# Patient Record
Sex: Male | Born: 1950 | Race: White | Hispanic: No | Marital: Married | Smoking: Current every day smoker
Health system: Southern US, Community
[De-identification: ages and names within clinical notes are randomized; demographics above are authoritative.]

## PROBLEM LIST (undated history)

## (undated) DIAGNOSIS — M1990 Unspecified osteoarthritis, unspecified site: Secondary | ICD-10-CM

## (undated) DIAGNOSIS — Z860100 Personal history of colon polyps, unspecified: Secondary | ICD-10-CM

## (undated) DIAGNOSIS — IMO0002 Reserved for concepts with insufficient information to code with codable children: Secondary | ICD-10-CM

## (undated) DIAGNOSIS — I10 Essential (primary) hypertension: Secondary | ICD-10-CM

## (undated) DIAGNOSIS — E119 Type 2 diabetes mellitus without complications: Secondary | ICD-10-CM

## (undated) DIAGNOSIS — E785 Hyperlipidemia, unspecified: Secondary | ICD-10-CM

## (undated) DIAGNOSIS — I2510 Atherosclerotic heart disease of native coronary artery without angina pectoris: Secondary | ICD-10-CM

## (undated) DIAGNOSIS — C189 Malignant neoplasm of colon, unspecified: Secondary | ICD-10-CM

## (undated) DIAGNOSIS — K611 Rectal abscess: Secondary | ICD-10-CM

## (undated) DIAGNOSIS — Z87442 Personal history of urinary calculi: Secondary | ICD-10-CM

## (undated) DIAGNOSIS — K259 Gastric ulcer, unspecified as acute or chronic, without hemorrhage or perforation: Secondary | ICD-10-CM

## (undated) DIAGNOSIS — K219 Gastro-esophageal reflux disease without esophagitis: Secondary | ICD-10-CM

## (undated) DIAGNOSIS — N200 Calculus of kidney: Secondary | ICD-10-CM

## (undated) DIAGNOSIS — R0600 Dyspnea, unspecified: Secondary | ICD-10-CM

## (undated) DIAGNOSIS — I219 Acute myocardial infarction, unspecified: Secondary | ICD-10-CM

## (undated) DIAGNOSIS — J449 Chronic obstructive pulmonary disease, unspecified: Secondary | ICD-10-CM

## (undated) HISTORY — DX: Hyperlipidemia, unspecified: E78.5

## (undated) HISTORY — DX: Essential (primary) hypertension: I10

## (undated) HISTORY — DX: Unspecified osteoarthritis, unspecified site: M19.90

## (undated) HISTORY — DX: Calculus of kidney: N20.0

## (undated) HISTORY — PX: POLYPECTOMY: SHX149

## (undated) HISTORY — DX: Reserved for concepts with insufficient information to code with codable children: IMO0002

## (undated) HISTORY — DX: Type 2 diabetes mellitus without complications: E11.9

## (undated) HISTORY — PX: COLONOSCOPY: SHX174

## (undated) HISTORY — DX: Chronic obstructive pulmonary disease, unspecified: J44.9

## (undated) HISTORY — DX: Atherosclerotic heart disease of native coronary artery without angina pectoris: I25.10

## (undated) HISTORY — DX: Acute myocardial infarction, unspecified: I21.9

---

## 2008-04-04 DIAGNOSIS — I219 Acute myocardial infarction, unspecified: Secondary | ICD-10-CM

## 2008-04-04 HISTORY — DX: Acute myocardial infarction, unspecified: I21.9

## 2008-04-04 HISTORY — PX: CORONARY ANGIOPLASTY WITH STENT PLACEMENT: SHX49

## 2008-04-29 ENCOUNTER — Ambulatory Visit: Payer: Self-pay | Admitting: Cardiology

## 2008-04-30 ENCOUNTER — Inpatient Hospital Stay (HOSPITAL_COMMUNITY): Admission: EM | Admit: 2008-04-30 | Discharge: 2008-05-02 | Payer: Self-pay | Admitting: Emergency Medicine

## 2008-05-06 ENCOUNTER — Ambulatory Visit: Payer: Self-pay | Admitting: Cardiology

## 2008-05-26 ENCOUNTER — Ambulatory Visit: Payer: Self-pay

## 2008-06-20 DIAGNOSIS — I2510 Atherosclerotic heart disease of native coronary artery without angina pectoris: Secondary | ICD-10-CM | POA: Insufficient documentation

## 2008-06-20 DIAGNOSIS — I10 Essential (primary) hypertension: Secondary | ICD-10-CM | POA: Insufficient documentation

## 2008-06-24 ENCOUNTER — Encounter: Payer: Self-pay | Admitting: Cardiology

## 2008-06-24 ENCOUNTER — Ambulatory Visit: Payer: Self-pay

## 2008-06-24 DIAGNOSIS — E785 Hyperlipidemia, unspecified: Secondary | ICD-10-CM | POA: Insufficient documentation

## 2008-06-24 DIAGNOSIS — L270 Generalized skin eruption due to drugs and medicaments taken internally: Secondary | ICD-10-CM | POA: Insufficient documentation

## 2008-06-24 DIAGNOSIS — E663 Overweight: Secondary | ICD-10-CM | POA: Insufficient documentation

## 2008-07-30 ENCOUNTER — Encounter: Payer: Self-pay | Admitting: Gastroenterology

## 2008-08-01 ENCOUNTER — Encounter (INDEPENDENT_AMBULATORY_CARE_PROVIDER_SITE_OTHER): Payer: Self-pay | Admitting: *Deleted

## 2008-08-01 ENCOUNTER — Encounter: Admission: RE | Admit: 2008-08-01 | Discharge: 2008-08-01 | Payer: Self-pay | Admitting: Family Medicine

## 2008-08-15 ENCOUNTER — Telehealth: Payer: Self-pay | Admitting: Cardiology

## 2008-08-22 ENCOUNTER — Ambulatory Visit: Payer: Self-pay | Admitting: Cardiology

## 2008-08-22 DIAGNOSIS — E7800 Pure hypercholesterolemia, unspecified: Secondary | ICD-10-CM | POA: Insufficient documentation

## 2008-08-25 ENCOUNTER — Ambulatory Visit: Payer: Self-pay | Admitting: Gastroenterology

## 2008-08-25 DIAGNOSIS — K7689 Other specified diseases of liver: Secondary | ICD-10-CM

## 2008-08-25 DIAGNOSIS — K7581 Nonalcoholic steatohepatitis (NASH): Secondary | ICD-10-CM | POA: Insufficient documentation

## 2008-08-29 ENCOUNTER — Encounter: Payer: Self-pay | Admitting: Gastroenterology

## 2008-09-02 ENCOUNTER — Telehealth: Payer: Self-pay | Admitting: Gastroenterology

## 2008-09-04 LAB — CONVERTED CEMR LAB
ALT: 27 units/L (ref 0–53)
AST: 24 units/L (ref 0–37)
Albumin: 3.9 g/dL (ref 3.5–5.2)
Alkaline Phosphatase: 60 units/L (ref 39–117)
Bilirubin, Direct: 0 mg/dL (ref 0.0–0.3)
Cholesterol: 125 mg/dL (ref 0–200)
HDL: 30 mg/dL — ABNORMAL LOW (ref >39.00)
LDL Cholesterol: 76 mg/dL (ref 0–99)
Total Bilirubin: 0.8 mg/dL (ref 0.3–1.2)
Total CHOL/HDL Ratio: 4
Total Protein: 7.6 g/dL (ref 6.0–8.3)
Triglycerides: 95 mg/dL (ref 0.0–149.0)
VLDL: 19 mg/dL (ref 0.0–40.0)

## 2008-09-11 ENCOUNTER — Ambulatory Visit: Payer: Self-pay | Admitting: Gastroenterology

## 2008-09-11 ENCOUNTER — Encounter: Payer: Self-pay | Admitting: Gastroenterology

## 2008-09-12 ENCOUNTER — Encounter: Payer: Self-pay | Admitting: Gastroenterology

## 2008-09-29 ENCOUNTER — Telehealth: Payer: Self-pay | Admitting: Cardiology

## 2008-10-28 ENCOUNTER — Telehealth: Payer: Self-pay | Admitting: Cardiology

## 2008-11-24 ENCOUNTER — Telehealth: Payer: Self-pay | Admitting: Cardiology

## 2008-12-19 ENCOUNTER — Ambulatory Visit: Payer: Self-pay | Admitting: Cardiology

## 2008-12-20 ENCOUNTER — Encounter: Payer: Self-pay | Admitting: Cardiology

## 2008-12-24 ENCOUNTER — Ambulatory Visit: Payer: Self-pay | Admitting: Cardiology

## 2008-12-24 ENCOUNTER — Telehealth: Payer: Self-pay | Admitting: Cardiology

## 2009-01-21 ENCOUNTER — Telehealth: Payer: Self-pay | Admitting: Cardiology

## 2009-02-17 ENCOUNTER — Telehealth: Payer: Self-pay | Admitting: Cardiology

## 2009-02-18 ENCOUNTER — Encounter (INDEPENDENT_AMBULATORY_CARE_PROVIDER_SITE_OTHER): Payer: Self-pay | Admitting: *Deleted

## 2009-02-20 ENCOUNTER — Telehealth: Payer: Self-pay | Admitting: Cardiology

## 2009-03-06 ENCOUNTER — Encounter (INDEPENDENT_AMBULATORY_CARE_PROVIDER_SITE_OTHER): Payer: Self-pay | Admitting: *Deleted

## 2009-03-11 ENCOUNTER — Telehealth: Payer: Self-pay | Admitting: Cardiology

## 2009-03-23 ENCOUNTER — Telehealth: Payer: Self-pay | Admitting: Cardiology

## 2009-04-09 ENCOUNTER — Telehealth: Payer: Self-pay | Admitting: Cardiology

## 2009-04-10 ENCOUNTER — Encounter (INDEPENDENT_AMBULATORY_CARE_PROVIDER_SITE_OTHER): Payer: Self-pay | Admitting: *Deleted

## 2009-04-22 ENCOUNTER — Telehealth: Payer: Self-pay | Admitting: Cardiology

## 2009-04-29 ENCOUNTER — Ambulatory Visit: Payer: Self-pay | Admitting: Cardiology

## 2009-04-29 ENCOUNTER — Encounter: Payer: Self-pay | Admitting: Cardiology

## 2009-05-28 ENCOUNTER — Telehealth: Payer: Self-pay | Admitting: Cardiology

## 2009-06-02 ENCOUNTER — Ambulatory Visit: Payer: Self-pay | Admitting: Cardiology

## 2009-06-04 ENCOUNTER — Encounter: Payer: Self-pay | Admitting: Cardiology

## 2009-06-18 ENCOUNTER — Telehealth (INDEPENDENT_AMBULATORY_CARE_PROVIDER_SITE_OTHER): Payer: Self-pay | Admitting: *Deleted

## 2009-06-30 ENCOUNTER — Encounter (INDEPENDENT_AMBULATORY_CARE_PROVIDER_SITE_OTHER): Payer: Self-pay | Admitting: *Deleted

## 2009-07-17 ENCOUNTER — Telehealth: Payer: Self-pay | Admitting: Cardiology

## 2009-07-17 ENCOUNTER — Telehealth (INDEPENDENT_AMBULATORY_CARE_PROVIDER_SITE_OTHER): Payer: Self-pay | Admitting: *Deleted

## 2009-09-03 ENCOUNTER — Emergency Department (HOSPITAL_COMMUNITY): Admission: EM | Admit: 2009-09-03 | Discharge: 2009-09-04 | Payer: Self-pay | Admitting: Emergency Medicine

## 2009-09-15 ENCOUNTER — Encounter (INDEPENDENT_AMBULATORY_CARE_PROVIDER_SITE_OTHER): Payer: Self-pay | Admitting: *Deleted

## 2009-09-17 ENCOUNTER — Telehealth: Payer: Self-pay | Admitting: Cardiology

## 2009-10-15 ENCOUNTER — Telehealth: Payer: Self-pay | Admitting: Cardiology

## 2010-01-13 ENCOUNTER — Telehealth: Payer: Self-pay | Admitting: Cardiology

## 2010-05-04 NOTE — Assessment & Plan Note (Signed)
Summary: ABD PN/FH   History of Present Illness Visit Type: consult Primary GI MD: Erskine Emery MD [facility] Primary Provider: Lynne Logan, MD Requesting Provider: Lynne Logan, ,MD Chief Complaint: RUQ pain, also right-sided chest pain Mr. Hamblin is a 60 year old white male referred at the request of Dr. Nolon Rod for colorectal cancer screening.  He only complaint is occasional right upper quadrant pain which is made worse by palpation.  Abdominal ultrasound, which I reviewed, demonstrated  some coarseness of the hepatic parenchyma suggestive of hepatic steatosis.  He denies change in bowel habits, melena or hematochezia.  Medical history is pertinent for coronary artery disease.  He status post MI in January, 2010 with placement of a coronary artery stent.   GI Review of Systems    Reports acid reflux, bloating, and  heartburn.      Denies abdominal pain, belching, chest pain, dysphagia with liquids, dysphagia with solids, loss of appetite, nausea, vomiting, vomiting blood, weight loss, and  weight gain.        Denies anal fissure, black tarry stools, change in bowel habit, constipation, diarrhea, diverticulosis, fecal incontinence, heme positive stool, hemorrhoids, irritable bowel syndrome, jaundice, light color stool, liver problems, rectal bleeding, and  rectal pain. Preventive Screening-Counseling & Management      Drug Use:  no.      Current Medications (verified): 1)  Nitroglycerin 0.4 Mg Subl (Nitroglycerin) .... One Tablet Under Tongue Every 5 Minutes As Needed For Chest Pain---May Repeat Times Three 2)  Effient 10 Mg Tabs (Prasugrel Hcl) .Marland Kitchen.. 1 Once Daily 3)  Simvastatin 40 Mg Tabs (Simvastatin) .... Take One Tablet By Mouth Daily At Bedtime 4)  Metoprolol Tartrate 25 Mg Tabs (Metoprolol Tartrate) .... Take One Tablet By Mouth Twice A Day 5)  Tracer Study Drug .... Uad 6)  Bayer Aspirin 325 Mg Tabs (Aspirin) .... Take 1 Tablet By Mouth Once A Day  Allergies  (verified): 1)  ! Plavix (Clopidogrel Bisulfate)  Past History:  Past Medical History:     1. Coronary artery disease (cardiac catheterization on April 29, 2008,  right coronary artery distal occlusion with stenting of this with Xience drug-eluting stent.  The patient also had a luminal irregularities in the LAD and 40-50% mid circumflex stenosis      2. Hypertension.      3. Nephrolithiasis.     Myocardial Infarction  Past Surgical History:    PTCA-Stent 04/30/08  Family History:    Noncontributory for early coronary disease but is      contributory for strong family history of cancer    Family History of Colon Cancer: Mother    Family History of Diabetes: Mother    Family History of Lung Cancer: Father  Social History:    The patient is married.  He has 3 children.  He works      as a Games developer.  He has been smoking at least 1 pack per day for 40      years.     Alcohol Use - no    Daily Caffeine Use 2/daily (Mt. Dew, Tea)    Illicit Drug Use - no    Drug Use:  no  Review of Systems       The patient complains of arthritis/joint pain, back pain, and muscle pains/cramps.  The patient denies allergy/sinus, anemia, anxiety-new, blood in urine, breast changes/lumps, change in vision, confusion, cough, coughing up blood, depression-new, fainting, fatigue, fever, headaches-new, hearing problems, heart murmur, heart rhythm changes, itching, night  sweats, nosebleeds, shortness of breath, skin rash, sleeping problems, sore throat, swelling of feet/legs, swollen lymph glands, thirst - excessive, urination - excessive, urination changes/pain, urine leakage, vision changes, and voice change.         Other systems were reviewed and were negative  Vital Signs:  Patient profile:   60 year old male Height:      64 inches Weight:      204 pounds BMI:     35.14 Pulse rate:   68 / minute Pulse rhythm:   regular BP sitting:   120 / 60  (left arm) Cuff size:   regular  Vitals Entered By:  Abelino Derrick CMA (Aug 25, 2008 4:14 PM)  Physical Exam  Additional Exam:  Usual developed well-nourished male  skin: anicteric HEENT: normocephalic; PEERLA; no nasal or pharyngeal abnormalities neck: supple nodes: no cervical lymphadenopathy chest: clear to ausculatation and percussion heart: no murmurs, gallops, or rubs abd: soft, nontender; BS normoactive; no abdominal masses, tenderness, organomegaly rectal: deferred ext: no cynanosis, clubbing, edema skeletal: no deformities neuro: oriented x 3; no focal abnormalities    Impression & Recommendations:  Problem # 1:  FATTY LIVER DISEASE (ICD-571.8) Patient has hepatic steatosis.  Liver tests from July 22, 2008 were normal.  Recommendations #1 no specific GI therapy.  Weight loss would  be ideal.  Problem # 2:  SPECIAL SCREENING FOR MALIGNANT NEOPLASMS COLON (ICD-V76.51)  Plan screening colonoscopy  Orders: Colonoscopy (Colon)  Problem # 3:  CAD, UNSPECIFIED SITE (ICD-414.00) Assessment: Comment Only  Problem # 4:  HYPERTENSION, UNSPECIFIED (ICD-401.9) Assessment: Comment Only  Patient Instructions: 1)  Colonoscopy and Flexible Sigmoidoscopy brochure given.  2)  Conscious Sedation brochure given.  3)  Your Colonoscopy is scheduled for 09/04/2008 at 3pm 4)  You can pick up your Movi-Prep from your pharmacy today 5)  CC Dr. Lynne Logan 6)  The medication list was reviewed and reconciled.  All changed / newly prescribed medications were explained.  A complete medication list was provided to the patient / caregiver. Prescriptions: MOVIPREP 100 GM  SOLR (PEG-KCL-NACL-NASULF-NA ASC-C) As per prep instructions.  #1 x 0   Entered by:   Genella Mech CMA   Authorized by:   Inda Castle MD   Signed by:   Genella Mech CMA on 08/25/2008   Method used:   [facility]  [address] [phone]       [place], [place]  [address]       Ph: [phone] or  [phone]       Fax: [phone]   RxID:   [id number]

## 2010-05-04 NOTE — Progress Notes (Signed)
Summary: Samples Effient  Phone Note Outgoing Call   Call placed by: Levora Angel, CNA,  April 09, 2009 4:13 PM Summary of Call: Callled pt, notified him that samples of Effient will be left at from desk.  Levora Angel, CNA  April 09, 2009 4:14 PM

## 2010-05-04 NOTE — Progress Notes (Signed)
Summary: SAMPLES OF MEDS  Phone Note Refill Request Call back at Home Phone [phone] Message from:  Patient on September 29, 2008 3:06 PM  Refills Requested: Medication #1:  EFFIENT 10 MG TABS 1 once daily SAMPLE OF MEDS.    Method Requested: Pick up at Office Initial call taken by: Neil Crouch,  September 29, 2008 3:06 PM Caller: Patient Reason for Call: Talk to Nurse  Follow-up for Phone Call        SAMPLES OF EFFIENT 10MG LOT [id number] C EXP 11/10  # 28.  PT AWARE  Follow-up by: Sim Boast, RN,  September 29, 2008 3:50 PM

## 2010-05-04 NOTE — Progress Notes (Signed)
Summary: request samples done daj  Phone Note Call from Patient Call back at Home Phone [phone]   Caller: Patient Summary of Call: request samples effient Initial call taken by: Darnell Level,  February 17, 2009 11:12 AM  Follow-up for Phone Call        left #14 at front desk will  call pt - no answer left message for pt to call back.  pt returned call will leave bottles in front Follow-up by: Lubertha Basque, CNA,  February 18, 2009 1:13 PM

## 2010-05-04 NOTE — Progress Notes (Signed)
Summary: samples  Phone Note Call from Patient Call back at Home Phone [phone]   Caller: Patient Summary of Call: wanting to know if we have any effient samples Initial call taken by: Darnell Level,  April 22, 2009 2:35 PM  Follow-up for Phone Call        Effient samples #14 [id number] C, 06/2010 exp. put out front for pt.  Pt was also given co-pay card to use.  Called patient and left message on machine for pt. Follow-up by: Sim Boast, RN,  April 22, 2009 3:11 PM

## 2010-05-04 NOTE — Progress Notes (Signed)
Summary: SAMPLE OF MEDS  Phone Note Refill Request Call back at Home Phone [phone] Message from:  Patient on November 24, 2008 11:14 AM  Refills Requested: Medication #1:  EFFIENT 10 MG TABS 1 once daily SAMPLES  Initial call taken by: Neil Crouch,  November 24, 2008 11:15 AM  Follow-up for Phone Call        samples left at front desk, No answer at home. Levora Angel, CNA  November 25, 2008 11:28 AM  Pt notified rx samples left at front desk. Levora Angel, CNA  November 25, 2008 3:44 PM

## 2010-05-04 NOTE — Progress Notes (Signed)
Summary: sample of EFFIENT 10 MG TABS 1 once daily  Phone Note Call from Patient Call back at Home Phone [phone]   Reason for Call: Talk to Nurse Summary of Call: sample of EFFIENT 10 MG TABS 1 once daily. the effient copay card expired . can he get another.  Initial call taken by: Neil Crouch,  October 15, 2009 3:49 PM  Follow-up for Phone Call        [facility] for pt. samples, a effient card and new RX @ the front desk for pt. Levora Angel, CNA  October 16, 2009 4:09 PM  Follow-up by: Levora Angel, CNA,  October 16, 2009 4:08 PM    Prescriptions: EFFIENT 10 MG TABS (PRASUGREL HCL) 1 once daily  #30 Tablet x 11   Entered by:   Levora Angel, CNA   Authorized by:   Wadie Lessen, MD, [facility]   Signed by:   Levora Angel, CNA on 10/16/2009   Method used:   Print then Give to Patient   RxID:   [id number] EFFIENT 10 MG TABS (PRASUGREL HCL) 1 once daily  #30 Tablet x 11   Entered by:   Levora Angel, CNA   Authorized by:   Minus Breeding, MD, [facility]   Signed by:   Levora Angel, CNA on 10/16/2009   Method used:   Print then Give to Patient   RxID:   [id number]

## 2010-05-04 NOTE — Assessment & Plan Note (Signed)
Summary: 6 month rov/sl   Visit Type:  Follow-up Primary Provider:  Lynne Logan, MD  CC:  CAD.  History of Present Illness: The patient presents for followup of his known coronary disease. Since I last saw him he has done well. He has had no further chest discomfort. 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 With this he denies any chest pressure, neck or arm discomfort. He has had no palpitations, presyncope or syncope. He has had no resting complaints such as PND or orthopnea. He did have a tendinitis and tendon release recently in his right shoulder as he had significant discomfort with range of motion activities.  Current Medications (verified): 1)  Nitroglycerin 0.4 Mg Subl (Nitroglycerin) .... One Tablet Under Tongue Every 5 Minutes As Needed For Chest Pain---May Repeat Times Three 2)  Effient 10 Mg Tabs (Prasugrel Hcl) .Marland Kitchen.. 1 Once Daily 3)  Simvastatin 40 Mg Tabs (Simvastatin) .... Take One Tablet By Mouth Daily At Bedtime 4)  Metoprolol Tartrate 25 Mg Tabs (Metoprolol Tartrate) .... Take One Tablet By Mouth Twice A Day 5)  Tracer Study Drug .... Uad 6)  Bayer Aspirin 325 Mg Tabs (Aspirin) .... Take 1 Tablet By Mouth Once A Day  Allergies (verified): 1)  ! Plavix (Clopidogrel Bisulfate)  Past History:  Past Medical History:  1. Coronary artery disease (cardiac catheterization on April 29, 2008,  right coronary artery distal occlusion with stenting of this with Xience drug-eluting stent.  The patient also had a luminal irregularities in the LAD and 40-50% mid circumflex stenosis )  2. Hypertension.   3. Nephrolithiasis.   4. Myocardial Infarction  Review of Systems       As stated in the HPI and negative for all other systems.   Vital Signs:  Patient profile:   60 year old male Height:      64 inches Weight:      201 pounds BMI:     34.63 Pulse rate:   72 / minute Resp:     16 per minute BP sitting:    150 / 91  (right arm)  Vitals Entered By: Levora Angel, CNA (December 24, 2008 4:33 PM)  Physical Exam  General:  Well developed, well nourished, in no acute distress. Head:  normocephalic and atraumatic Eyes:  PERRLA/EOM intact; conjunctiva and lids normal. Mouth:  Teeth, gums and palate normal. Oral mucosa normal. Neck:  Neck supple, no JVD. No masses, thyromegaly or abnormal cervical nodes. Chest Wall:  no deformities or breast masses noted Lungs:  Clear bilaterally to auscultation and percussion. Abdomen:  Bowel sounds positive; abdomen soft and non-tender without masses, organomegaly, or hernias noted. No hepatosplenomegaly. Msk:  Back normal, normal gait. Muscle strength and tone normal. Extremities:  No clubbing or cyanosis. Neurologic:  Alert and oriented x 3. Skin:  Intact without lesions or rashes. Cervical Nodes:  no significant adenopathy Axillary Nodes:  no significant adenopathy Inguinal Nodes:  no significant adenopathy Psych:  Normal affect.   Detailed Cardiovascular Exam  Neck    Carotids: Carotids full and equal bilaterally without bruits.      Neck Veins: Normal, no JVD.    Heart    Inspection: no deformities or lifts noted.      Palpation: normal PMI with no thrills palpable.      Auscultation: regular rate and rhythm, S1, S2 without murmurs, rubs, gallops, or clicks.    Vascular    Abdominal Aorta: no palpable masses,  pulsations, or audible bruits.      Femoral Pulses: normal femoral pulses bilaterally.      Pedal Pulses: normal pedal pulses bilaterally.      Radial Pulses: normal radial pulses bilaterally.      Peripheral Circulation: no clubbing, cyanosis, or edema noted with normal capillary refill.     EKG  Procedure date:  12/24/2008  Findings:      Sinus rhythm, rate 72, axis within normal limits, intervals within normal limits, no acute ST-T wave changes.  Impression & Recommendations:  Problem # 1:  CAD, UNSPECIFIED SITE  (ICD-414.00) The patient has no new cardiovascular symptoms. At this point no further cardiovascular testing is suggested. He needs to continue with aggressive risk reduction.   Orders: EKG w/ Interpretation (93000)  Problem # 2:  HYPERTENSION, UNSPECIFIED (ICD-401.9) His Blood pressure is not well controlled. I will increase his metoprolol to 50 mg b.i.d. He also needs therapeutic lifestyle changes to include weight loss.  Problem # 3:  PURE HYPERCHOLESTEROLEMIA (ICD-272.0) I did review his lipid profile. His HDL is low. I have given him specifics on a walking regimen to try to improve this.  Problem # 4:  OVERWEIGHT (ICD-278.02) We had a long discussion about the need to lose weight with diet and exercise.  Patient Instructions: 1)  Your physician recommends that you schedule a follow-up appointment in: 6 MONTHS 2)  Your physician has recommended you make the following change in your medication: INCREASE METOPROLOL TO 50 MG TWICE DAILY NEED TO  NOTIFY DRUGSTORE

## 2010-05-04 NOTE — Assessment & Plan Note (Signed)
Summary: [id number]   Visit Type:  Follow-up Primary Provider:  SLynne Logan MD  CC:  CAD.  History of Present Illness: The patient presents for followup of his known coronary disease. At the last appointment I increased his beta blocker as his blood pressure was not at target. He did well with that and says that his blood pressures have been in the 1038Uto 1828Msystolic and diastolics well below 90. He has been walking every day since January 1. He has no chest pressure, neck or arm discomfort. These had no shortness of breath, PND or orthopnea. He said no palpitations, presyncope or syncope. He has not been able to lose weight because "I love to eat."  Current Medications (verified): 1)  Nitroglycerin 0.4 Mg Subl (Nitroglycerin) .... One Tablet Under Tongue Every 5 Minutes As Needed For Chest Pain---May Repeat Times Three 2)  Effient 10 Mg Tabs (Prasugrel Hcl) ..Marland Kitchen. 1 Once Daily 3)  Simvastatin 40 Mg Tabs (Simvastatin) .... Take One Tablet By Mouth Daily At Bedtime 4)  Metoprolol Tartrate 50 Mg Tabs (Metoprolol Tartrate) ..Marland Kitchen. 1 By Mouth Two Times A Day 5)  Bayer Aspirin 325 Mg Tabs (Aspirin) .... Take 1 Tablet By Mouth Once A Day  Allergies (verified): 1)  ! Plavix (Clopidogrel Bisulfate)  Past History:  Past Medical History: Reviewed history from 12/24/2008 and no changes required.  1. Coronary artery disease (cardiac catheterization on April 29, 2008,  right coronary artery distal occlusion with stenting of this with Xience drug-eluting stent.  The patient also had a luminal irregularities in the LAD and 40-50% mid circumflex stenosis )  2. Hypertension.   3. Nephrolithiasis.   4. Myocardial Infarction  Past Surgical History: Reviewed history from 08/25/2008 and no changes required. PTCA-Stent 04/30/08  Review of Systems       As stated in the HPI and negative for all other systems.   Vital Signs:  Patient profile:   60year old male Height:      64 inches Weight:       207 pounds BMI:     35.66 Pulse rate:   69 / minute Resp:     16 per minute BP sitting:   152 / 76  (right arm)  Vitals Entered By: CLevora Angel CNA (June 02, 2009 9:28 AM)  Physical Exam  General:  Well developed, well nourished, in no acute distress. Head:  normocephalic and atraumatic Eyes:  PERRLA/EOM intact; conjunctiva and lids normal. Mouth:  Poor dentition, gums and palate normal. Oral mucosa normal. Neck:  Neck supple, no JVD. No masses, thyromegaly or abnormal cervical nodes. Chest Wall:  no deformities or breast masses noted Lungs:  Clear bilaterally to auscultation and percussion. Heart:  Non-displaced PMI, chest non-tender; regular rate and rhythm, S1, S2 without murmurs, rubs or gallops. Carotid upstroke normal, no bruit. Normal abdominal aortic size, no bruits. Femorals normal pulses, no bruits. Pedals normal pulses. No edema, no varicosities. Abdomen:  Bowel sounds positive; abdomen soft and non-tender without masses, organomegaly, or hernias noted. No hepatosplenomegaly, obese Msk:  Back normal, normal gait. Muscle strength and tone normal. Extremities:  No clubbing or cyanosis. Neurologic:  Alert and oriented x 3. Skin:  Intact without lesions or rashes. Psych:  Normal affect.   EKG  Procedure date:  06/02/2009  Findings:      sinus rhythm, rate 69, axis within normal limits, intervals within normal limits,no acute ST-T wave changes.  Impression & Recommendations:  Problem # 1:  CAD, UNSPECIFIED  SITE (ICD-414.00) He is having no new symptoms.I would prefer that he stays on the Effient for now but may change this in the future. He could certainly discontinue it for any elective procedure. He will otherwise remain on the meds as listed and continue risk reduction. Orders: EKG w/ Interpretation (93000)  Problem # 2:  DYSLIPIDEMIA (ICD-272.4) I have given him written instructions for lipid profile and liver enzymes. I would like to review these with a goal  LDL less than 100 and HDL greater than 40.  Problem # 3:  OVERWEIGHT (ICD-278.02) We again discussed the need to lose weight with diet and exercise.  Problem # 4:  HYPERTENSION, UNSPECIFIED (ICD-401.9) His blood pressure is elevated today but as reported above it is well controlled otherwise. He will continue on the meds as listed.  Patient Instructions: 1)  Your physician recommends that you schedule a follow-up appointment in: 1 year with Dr Percival Spanish 2)  Your physician recommends that you continue on your current medications as directed. Please refer to the Current Medication list given to you today.

## 2010-05-04 NOTE — Miscellaneous (Signed)
Summary: Physician Information for Elective Procedures & Surgeries  Physician Information for Elective Procedures & Surgeries   Imported By: Bubba Hales 08/29/2008 08:41:48  _____________________________________________________________________  External Attachment:    Type:   Image     Comment:   External Document

## 2010-05-04 NOTE — Progress Notes (Signed)
Summary: [place] note---rx metoprolol  Phone Note Outgoing Call   Call placed by: Devra Dopp, LPN,  December 25, 386 5:43 PM Summary of Call: CALLED PT  TO FIND OUT DRUG STORE  TO CALL IN INCREASE DOSE OF METOPROLOL TO 50 MG two times a day  LM FOR PT TO CALL BACK TOMMORRROW Initial call taken by: Devra Dopp, LPN,  December 25, 8278 5:45 PM  Follow-up for Phone Call        Spoke with pt [facility] Levora Angel, CNA  December 25, 2008 9:14 AM  Follow-up by: Levora Angel, CNA,  December 25, 2008 9:14 AM    New/Updated Medications: METOPROLOL TARTRATE 50 MG TABS (METOPROLOL TARTRATE) 1 by mouth two times a day Prescriptions: METOPROLOL TARTRATE 50 MG TABS (METOPROLOL TARTRATE) 1 by mouth two times a day  #60 x 11   Entered by:   Levora Angel, CNA   Authorized by:   Minus Breeding, MD, [facility]   Signed by:   Levora Angel, CNA on 12/25/2008   Method used:   [facility]        [facility]  [address] [phone][facility] (retail[facility]       [place], [place]  [address]       Ph: [phone] or [phone]       Fax: [phone]   RxID:   [id number]

## 2010-05-04 NOTE — Letter (Signed)
Summary: Colonoscopy Letter  [facility]  [place], [place] [address]   Phone: [phone]  Fax: [phone]      September 15, 2009 MRN: [id number]   [place], [place]  [address]   Dear Mr. Murri,   According to your medical record, it is time for you to schedule a Colonoscopy. The American Cancer Society recommends this procedure as a method to detect early colon cancer. Patients with a family history of colon cancer, or a personal history of colon polyps or inflammatory bowel disease are at increased risk.  This letter has been generated based on the recommendations made at the time of your procedure. If you feel that in your particular situation this may no longer apply, please contact our office.  Please call our office at [phone] to schedule this appointment or to update your records at your earliest convenience.  Thank you for cooperating with [place] to provide you with the very best care possible.   Sincerely,  Sandy Salaam. Deatra Ina, M.D.  [facility] Gastroenterology Division [phone]

## 2010-05-04 NOTE — Progress Notes (Signed)
Summary: ? MEDS ( Effient)  Phone Note Call from Patient Call back at Home Phone [phone]   Caller: Spouse Call For: [name]  Reason for Call: Talk to Nurse Summary of Call: proc sch on thurs and has several ? re: meds  Initial call taken by: [name] [facility],  September 02, 2008 4:17 PM  Follow-up for Phone Call        Returned pts call on yesterday afternoon, pt stated he was on a med called Effient this med is a blood thinner such as Plavix, He wanted to know if he had to hold the Effient, Told him I would have to get with Dr Deatra Ina to see what we need to do about holding this med before his Colonoscopy on Thursday June 3rd.   Dr Deatra Ina resonded by stating, That we needed to hold the med 7 days prior to colon, I r/s colonoscopy to 09/11/2008 at [place] over instructions and time changes with pt. Pt to stop Effient on 09/04/2008. Pt understands changes and will call if he has any further questions Follow-up by: Genella Mech CMA,  September 03, 2008 8:15 AM

## 2010-05-04 NOTE — Progress Notes (Signed)
Summary: refill request  Phone Note Refill Request Message from:  Patient on January 13, 2010 12:12 PM  Refills Requested: Medication #1:  METOPROLOL TARTRATE 50 MG TABS 1 by mouth two times a day [facility] [address]    Method Requested: Telephone to Pharmacy Initial call taken by: Lorenda Hatchet,  January 13, 2010 12:13 PM    Prescriptions: METOPROLOL TARTRATE 50 MG TABS (METOPROLOL TARTRATE) 1 by mouth two times a day  #60 x 6   Entered by:   Levora Angel, CNA   Authorized by:   Minus Breeding, MD, [facility]   Signed by:   Levora Angel, CNA on 01/13/2010   Method used:   [facility]        [facility]  [address] [phone]* (retail)       [place], [place]  [address]       Ph: [phone] or [phone]       Fax: [phone]   RxID:   [id number]   Appended Document: refill request    Clinical Lists Changes  Pt notified RX sent into pharmacy, [facility] for pt. CTuck 01/13/10

## 2010-05-04 NOTE — Progress Notes (Signed)
Summary: SAMPLES  Effient  Phone Note Call from Patient Call back at Home Phone [phone]   Caller: Patient [phone] Reason for Call: Talk to Nurse Summary of Call: PT WANTS TO KNOW IF HE CAN GET SAMPLES OF EFFIENT 10MG Initial call taken by: Lorenda Hatchet,  September 17, 2009 9:06 AM  Follow-up for Phone Call        will have to contact rep for samples.  Vic Ripper, RN  can only find #14, no other samples here lot #[id number]  exp 10/2010.  will put them at front desk for pick up Follow-up by: Sim Boast, RN,  September 17, 2009 12:44 PM

## 2010-05-04 NOTE — Progress Notes (Signed)
Summary: samples   Phone Note Call from Patient Call back at Home Phone [phone]   Caller: Patient Summary of Call: samples Effient 8m Initial call taken by: MDelsa Sale  March 23, 2009 2:41 PM  Follow-up for Phone Call        No samples available right now. Advised pt to call back in 1-2 weeks. CLevora Angel CNA  March 24, 2009 1:18 PM  Follow-up by: CLevora Angel CNA,  March 24, 2009 1:18 PM

## 2010-05-04 NOTE — Progress Notes (Signed)
Summary: samples of effientf available  Phone Note Refill Request Call back at Home Phone [phone]   Refills Requested: Medication #1:  EFFIENT 10 MG TABS 1 once daily Are samples available?   Method Requested: Pick up at Office Initial call taken by: Alexander Bergeron,  January 21, 2009 12:56 PM  Follow-up for Phone Call        samples left at front desk. [facility] for pt Levora Angel, CNA  January 22, 2009 1:07 PM    Follow-up by: Levora Angel, CNA,  January 22, 2009 1:07 PM

## 2010-05-04 NOTE — Progress Notes (Signed)
Summary: SAMPLES OF MEDS   Phone Note Call from Patient Call back at Home Phone [phone]   Caller: Patient Reason for Call: Talk to Nurse Summary of Call: CALLING PAM BACK REGARDING SAMPLES OF EFFIENT 10 MG Initial call taken by: Neil Crouch,  Aug 15, 2008 8:58 AM  Follow-up for Phone Call        Pt notified samples left at [place] desk. Pt will pick up Friday when he has appointment. Levora Angel, CNA  Aug 18, 2008 2:11 PM

## 2010-05-04 NOTE — Progress Notes (Signed)
Summary: samples  Phone Note Call from Patient Call back at Home Phone [phone]   Caller: Patient Reason for Call: Talk to Nurse Summary of Call: request effient 85m samples Initial call taken by: JDarnell Level  July 17, 2009 8:35 AM  Follow-up for Phone Call        Lot ##[id number] Exp: 03 2012 Pt notified samples left at front  desk. CLevora Angel CNA  July 17, 2009 2:06 PM  Follow-up by: CLevora Angel CNA,  July 17, 2009 2:06 PM

## 2010-05-04 NOTE — Progress Notes (Signed)
Summary: rx to [facility] Effient 10 mg  Phone Note Other Incoming   Caller: pt Summary of Call: walked in to pick up samples.  states he needs his Effient called in [place] in [place].  Rx will be send as requested. Initial call taken by: Sim Boast, RN,  July 17, 2009 4:13 PM    Prescriptions: EFFIENT 10 MG TABS (PRASUGREL HCL) 1 once daily  #30 Tablet x 6   Entered by:   Sim Boast, RN   Authorized by:   Minus Breeding, MD, [facility]   Signed by:   Sim Boast, RN on 07/17/2009   Method used:   Electronically to        [facility] (retail)       [place] [address]       [place], [place]  [address]       Ph: [phone]       Fax: [phone]   RxID:   [id number]

## 2010-05-04 NOTE — Progress Notes (Signed)
Summary: samples of med  Phone Note Call from Patient Call back at Home Phone [phone]   Caller: Patient Reason for Call: Talk to Nurse Details for Reason: sample of effient Initial call taken by: Neil Crouch,  May 28, 2009 1:28 PM  Follow-up for Phone Call        Samoples left at front desk. LMOM for pt. Levora Angel, CNA  May 29, 2009 2:38 PM  Follow-up by: Levora Angel, CNA,  May 29, 2009 2:39 PM

## 2010-05-04 NOTE — Letter (Signed)
Summary: Results Letter  [facility]  [place], [place] [address]   Phone: [phone]  Fax: [phone]        Aug 25, 2008 MRN: [id number]    [place], [place]  [address]    Dear Mr. GLAUS,  It is my pleasure to have treated you recently as a new patient in my office. I appreciate your confidence and the opportunity to participate in your care.  Since I do have a busy inpatient endoscopy schedule and office schedule, my office hours vary weekly. I am, however, available for emergency calls everyday through my office. If I am not available for an urgent office appointment, another one of our gastroenterologist will be able to assist you.  My well-trained staff are prepared to help you at all times. For emergencies after office hours, a physician from our Gastroenterology section is always available through my 24 hour answering service  Once again I welcome you as a new patient and I look forward to a happy and healthy relationship             Sincerely,  Inda Castle MD  This letter has been electronically signed by your physician.  Appended Document: Results Letter letter mailed

## 2010-05-04 NOTE — Miscellaneous (Signed)
  Clinical Lists Changes  Observations: Added new observation of RS STUDY: TRACER (03/06/2009 13:06)      Research Study Name: TRACER

## 2010-05-04 NOTE — Progress Notes (Signed)
Summary: effient samples  Phone Note Call from Patient Call back at Home Phone [phone]   Caller: Patient Reason for Call: Talk to Nurse Summary of Call: request samples of effient Initial call taken by: Darnell Level,  June 18, 2009 12:00 PM  Follow-up for Phone Call        Called Pt's home he was not in the ,left a  message for him, samples at front desk. Follow-up by: Lynden Ang,  June 18, 2009 2:34 PM

## 2010-05-04 NOTE — Progress Notes (Signed)
Summary: samples--effient  Phone Note Call from Patient Call back at Home Phone [phone]   Caller: Patient Reason for Call: Talk to Nurse Summary of Call: request to see if he can get 2 more bottles of effient... has 2 bottles waiting at front dest, will be by today to pick up Initial call taken by: Darnell Level,  February 20, 2009 10:17 AM  Follow-up for Phone Call        NO more samples in closet pt notified Levora Angel, CNA  February 20, 2009 2:58 PM  Follow-up by: Levora Angel, CNA,  February 20, 2009 2:58 PM

## 2010-05-04 NOTE — Procedures (Signed)
Summary: Colonoscopy   Colonoscopy  Procedure date:  09/11/2008  Findings:      Location:  [place].    Procedures Next Due Date:    Colonoscopy: 09/2009  COLONOSCOPY PROCEDURE REPORT  PATIENT:  [name] [name], [name] [name]  MR#:  [id number] BIRTHDATE:   [birth year], 65 yrs. old   GENDER:   male  ENDOSCOPIST:   Sandy Salaam. Deatra Ina, MD Referred by:   PROCEDURE DATE:  09/11/2008 PROCEDURE:  Colonoscopy with polypectomy and submucosal injection, Colonoscopy with snare polypectomy, Colon with cold biopsy polypectomy ASA CLASS:   Class II INDICATIONS: Routine Risk Screening   MEDICATIONS:    Fentanyl 50 mcg IV, Versed 6 mg IV  DESCRIPTION OF PROCEDURE:   After the risks benefits and alternatives of the procedure were thoroughly explained, informed consent was obtained.  Digital rectal exam was performed and revealed no abnormalities.   The LB [id number] endoscope was introduced through the anus and advanced to the cecum, which was identified by both the appendix and ileocecal valve, without limitations.  The quality of the prep was excellent, using MiraLax.  The instrument was then slowly withdrawn as the colon was fully examined. <<PROCEDUREIMAGES>>                    <<OLD IMAGES>>  FINDINGS:  A sessile polyp was found in the ascending colon. It was 1.5 cm in size. Polyp was snared, then cauterized with monopolar cautery. Retrieval was successful (see image5). snare polyp Polyp removed following 2cc NS injection into submucosa  There were multiple polyps identified and removed. in the transverse colon (see image8 and image9). 2 sessile polyps 1cm each - removed following NS submucosal injection and submitted to pathology  A sessile polyp was found in the mid transverse colon. It was 2 mm in size. The polyp was removed using cold biopsy forceps.  A pedunculated polyp was found in the sigmoid colon. It was 2 cm in size. It was found 40 cm from the point of entry. Polyp was snared,  then cauterized with monopolar cautery. Retrieval was successful (see image12). snare polyp  A sessile polyp was found in the sigmoid colon. It was 5 mm in size. It was found 35 cm from the point of entry. Polyp was snared without cautery. Retrieval was successful. snare polyp  A diverticulum was found in the descending colon (see image11).  This was otherwise a normal examination of the colon (see image2, image3, image14, image15, image16, and image17).   Retroflexed views in the rectum revealed no abnormalities.    The scope was then withdrawn from the patient and the procedure completed.  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   _______________________________ Sandy Salaam. Deatra Ina, MD  CC:      REPORT OF SURGICAL PATHOLOGY   Case #: [id number] Patient Name: [name] [name] Office Chart Number:  [id number]   MRN: [id number] Pathologist: Chrystie Nose. Saralyn Pilar, MD DOB/Age  [birth year] (Age: 85)    Gender: M Date Taken:  09/11/2008 Date Received: 09/11/2008   FINAL DIAGNOSIS   ***MICROSCOPIC EXAMINATION AND DIAGNOSIS***   1.  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  NO EVIDENCE OF INVASIVE CARCINOMA IDENTIFIED.   2.  COLON, TRANSVERSE POLYP:  TUBULAR ADENOMA AND A FRAGMENT OF BENIGN COLONIC MUCOSA.  NO HIGH GRADE DYSPLASIA OR MALIGNANCY IDENTIFIED.   3.  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  NO INVASIVE CARCINOMA IDENTIFIED.   4.   COLON, SIGMOID POLYP:  TUBULOVILLOUS ADENOMA.  NO HIGH GRADE DYSPLASIA OR MALIGNANCY IDENTIFIED.   COMMENT Focally within the ascending polyp and in the descending polyp (part 1 and 3) there is cytologic and architectural atypia consistent with focal high grade glandular dysplasia.  Invasive carcinoma is not identified.  (JDP:gt, 09/12/08)   gdt Date Reported:  09/12/2008     Chrystie Nose. Saralyn Pilar, MD *** Electronically Signed Out By JDP ***     September 12, 2008 MRN: [id number]    [name], [place]  [address]    Dear Mr. Bennett,  I am pleased to inform you that the colon polyp(s) removed during your recent colonoscopy was (were) found to be benign (no cancer detected) upon pathologic examination.  I recommend you have a repeat colonoscopy examination in _1 years to look for recurrent polyps, as having colon polyps increases your risk for having recurrent polyps or even colon cancer in the future.  Should you develop new or worsening symptoms of abdominal pain, bowel habit changes or bleeding from the rectum or bowels, please schedule an evaluation with either your primary care physician or with me.  Additional information/recommendations:  __ No further action with gastroenterology is needed at this time. Please      follow-up with your primary care physician for your other healthcare      needs.  __ Please call [phone] to schedule a return visit to review your      situation.  __ Please keep your follow-up visit as already scheduled.  _x_ Continue treatment plan as outlined the day of your exam.  Please call us if you are having persistent problems or have questions about your condition that have not been fully answered at this time.  Sincerely,  Inda Castle MD  This letter has been electronically signed by your physician.   Signed by Inda Castle MD on 09/12/2008 at 4:19 PM   This report was created from the original endoscopy  report, which was reviewed and signed by the above listed endoscopist.

## 2010-05-04 NOTE — Miscellaneous (Signed)
  Clinical Lists Changes  Observations: Added new observation of RS STUDY: TRACER - study completion 04/28/09 (06/30/2009 12:07)      Research Study Name: [place]oy - study completion 04/28/09

## 2010-05-04 NOTE — Progress Notes (Signed)
Summary: samples of meds  Phone Note Call from Patient Call back at Home Phone [phone]   Caller: Patient Summary of Call: patient requesting samples of effient Initial call taken by: Alexander Bergeron,  October 28, 2008 1:43 PM  Follow-up for Phone Call        pt calling back to check on sample of meds Neil Crouch  October 30, 2008 4:03 PM  EFFIENT 10 mg sample #28 left at front desk exp 01 2011 Lot [id number] Follow-up by: Sim Boast, RN,  October 30, 2008 4:47 PM

## 2010-05-04 NOTE — Progress Notes (Signed)
Summary: samples  Phone Note Call from Patient Call back at Home Phone [phone]   Caller: Patient Reason for Call: Talk to Nurse Summary of Call: requesting samples of effient 10 mg Initial call taken by: Darnell Level,  March 11, 2009 11:29 AM  Follow-up for Phone Call        samples left at front desk. pt notified. Levora Angel, CNA  March 12, 2009 9:29 AM  Follow-up by: Levora Angel, CNA,  March 12, 2009 9:29 AM

## 2010-05-04 NOTE — Assessment & Plan Note (Signed)
Summary: [facility]   Visit Type:  Follow-up Primary Provider:  Juanita Craver, MD  CC:  right arm pain.  History of Present Illness: The patient is 60 years old. He presents for followup after being seen as an add-on recently. He had a rash we ascribed to Plavix. We discontinued this medication. The rash has since resolved. Since I saw him he has not been particularly active. He has gained 9 pounds. He denies any chest discomfort, neck or arm discomfort. He has had no palpitations, presyncope or syncope. He is doing some walking perhaps 30 minutes 3 days a week.  Current Medications (verified): 1)  Nitroglycerin 0.4 Mg Subl (Nitroglycerin) .... One Tablet Under Tongue Every 5 Minutes As Needed For Chest Pain---May Repeat Times Three 2)  Effient 10 Mg Tabs (Prasugrel Hcl) .Marland Kitchen.. 1 Once Daily 3)  Simvastatin 40 Mg Tabs (Simvastatin) .... Take One Tablet By Mouth Daily At Bedtime 4)  Metoprolol Tartrate 25 Mg Tabs (Metoprolol Tartrate) .... Take One Tablet By Mouth Twice A Day 5)  Tracer Study Drug .... Uad  Allergies (verified): 1)  ! Plavix (Clopidogrel Bisulfate)  Past History:  Past Medical History:    Reviewed history from 06/20/2008 and no changes required:     1. Coronary artery disease (cardiac catheterization on April 29, 2008,  right coronary artery distal occlusion with stenting of this with Xience drug-eluting stent.  The patient also had a luminal irregularities in the LAD and 40-50% mid circumflex stenosis      2. Hypertension.      3. Nephrolithiasis.   Past Surgical History:    Reviewed history from 06/20/2008 and no changes required:    None.   Review of Systems       as stated in the history of present illness and otherwise negative for all other systems.  Vital Signs:  Patient profile:   60 year old male Height:      64 inches Weight:      199 pounds BMI:     34.28 Pulse rate:   67 / minute BP sitting:   154 / 91  (right arm)  Vitals Entered By:  Margaretmary Bayley CMA (June 24, 2008 4:25 PM)  Physical Exam  General:  Well developed, well nourished, in no acute distress. Head:  normocephalic and atraumatic Eyes:  PERRLA/EOM intact; conjunctiva and lids normal. Mouth:  Teeth, gums and palate normal. Oral mucosa normal. Neck:  Neck supple, no JVD. No masses, thyromegaly or abnormal cervical nodes. Chest Wall:  no deformities or breast masses noted Lungs:  Clear bilaterally to auscultation and percussion. Abdomen:  Bowel sounds positive; abdomen soft and non-tender without masses, organomegaly, or hernias noted. No hepatosplenomegaly. Msk:  Back normal, normal gait. Muscle strength and tone normal. Pulses:  pulses normal in all 4 extremities Extremities:  No clubbing or cyanosis. Neurologic:  Alert and oriented x 3. Skin:  Intact without lesions or rashes. Cervical Nodes:  no significant adenopathy Axillary Nodes:  no significant adenopathy Inguinal Nodes:  no significant adenopathy Psych:  Normal affect.   Detailed Cardiovascular Exam  Neck    Carotids: Carotids full and equal bilaterally without bruits.      Neck Veins: Normal, no JVD.    Heart    Inspection: no deformities or lifts noted.      Palpation: normal PMI with no thrills palpable.      Auscultation: regular rate and rhythm, S1, S2 without murmurs, rubs, gallops, or clicks.    Vascular  Abdominal Aorta: no palpable masses, pulsations, or audible bruits.      Femoral Pulses: normal femoral pulses bilaterally.      Pedal Pulses: normal pedal pulses bilaterally.      Radial Pulses: normal radial pulses bilaterally.      Peripheral Circulation: no clubbing, cyanosis, or edema noted with normal capillary refill.     Impression & Recommendations:  Problem # 1:  CAD, UNSPECIFIED SITE (ICD-414.00) The patient is having no further complaints. He will continue with secondary risk reduction.  Problem # 2:  HYPERTENSION, UNSPECIFIED (ICD-401.9) His blood pressure is  well controlled he will continue with the meds as listed.  Problem # 3:  DERMATITIS DUE DRUGS&MEDICINES TAKEN INTERNALLY (ICD-693.0) The patient clearly had a rash related to Plavix. He is now on Effient.  He should remain on this for at least one year.  Problem # 4:  OVERWEIGHT (ICD-278.02) We talked about the need to lose weight with diet. We discussed exercise. Hopefully he will start it eventually.  Problem # 5:  DYSLIPIDEMIA (ICD-272.4) I will check a lipid profile with a goal LDL less than 100 and HDL greater than 40.  Patient Instructions: 1)  I will see the patient again in 6 months or sooner if needed.

## 2010-05-04 NOTE — Letter (Signed)
Summary: Appointment - Reminder [place], [place]  [address] [place]   [place], [place] [address]   Phone: [phone]  Fax: [phone]     April 10, 2009 MRN: [id number]   [place], [place]  [address]   Dear Mr. Nohr,  Our records indicate that it is time to schedule a follow-up appointment. Dr.Hochrein recommended that you follow up with [place] in March,2011. It is very important that we reach you to schedule this appointment. We look forward to participating in your health care needs. Please contact us at the number listed above at your earliest convenience to schedule your appointment.  If you are unable to make an appointment at this time, give [place] a call so we can update our records.     Sincerely,   Public relations account executive

## 2010-05-04 NOTE — Letter (Signed)
Summary: Appointment - Reminder [place], [place]  [address] [place]   [place], [place] [address]   Phone: [phone]  Fax: [phone]     February 18, 2009 MRN: [id number]   [place], [place]  [address]   Dear Mr. Wardrop,  Our records indicate that it is time to schedule a follow-up appointment.Dr.HOCHREIN recommended that you follow up with [place] in FEB,2011. It is very important that we reach you to schedule this appointment. We look forward to participating in your health care needs. Please contact us at the number listed above at your earliest convenience to schedule your appointment.  If you are unable to make an appointment at this time, give [place] a call so we can update our records.     Sincerely, GESILA,DAVIS  Public relations account executive

## 2010-06-21 LAB — PROTIME-INR
INR: 1.11 (ref 0.00–1.49)
Prothrombin Time: 14.2 seconds (ref 11.6–15.2)

## 2010-06-21 LAB — BASIC METABOLIC PANEL
BUN: 12 mg/dL (ref 6–23)
CO2: 27 mEq/L (ref 19–32)
Calcium: 9.1 mg/dL (ref 8.4–10.5)
Chloride: 109 mEq/L (ref 96–112)
Creatinine, Ser: 1.06 mg/dL (ref 0.4–1.5)
GFR calc Af Amer: >60 mL/min (ref >60)
GFR calc non Af Amer: >60 mL/min (ref >60)
Glucose, Bld: 105 mg/dL — ABNORMAL HIGH (ref 70–99)
Potassium: 3.5 mEq/L (ref 3.5–5.1)
Sodium: 142 mEq/L (ref 135–145)

## 2010-06-21 LAB — D-DIMER, QUANTITATIVE: D-Dimer, Quant: 0.28 ug/mL-FEU (ref 0.00–0.48)

## 2010-06-21 LAB — DIFFERENTIAL
Basophils Absolute: 0 10*3/uL (ref 0.0–0.1)
Basophils Relative: 1 % (ref 0–1)
Eosinophils Absolute: 0.1 10*3/uL (ref 0.0–0.7)
Eosinophils Relative: 1 % (ref 0–5)
Lymphocytes Relative: 22 % (ref 12–46)
Lymphs Abs: 1.7 10*3/uL (ref 0.7–4.0)
Monocytes Absolute: 0.8 10*3/uL (ref 0.1–1.0)
Monocytes Relative: 10 % (ref 3–12)
Neutro Abs: 5.2 10*3/uL (ref 1.7–7.7)
Neutrophils Relative %: 67 % (ref 43–77)

## 2010-06-21 LAB — CBC
HCT: 42.8 % (ref 39.0–52.0)
Hemoglobin: 15.1 g/dL (ref 13.0–17.0)
MCHC: 35.2 g/dL (ref 30.0–36.0)
MCV: 96.7 fL (ref 78.0–100.0)
Platelets: 148 10*3/uL — ABNORMAL LOW (ref 150–400)
RBC: 4.43 MIL/uL (ref 4.22–5.81)
RDW: 12.8 % (ref 11.5–15.5)
WBC: 7.8 10*3/uL (ref 4.0–10.5)

## 2010-06-21 LAB — POCT CARDIAC MARKERS
CKMB, poc: <1 ng/mL — ABNORMAL LOW (ref 1.0–8.0)
Myoglobin, poc: 62.6 ng/mL (ref 12–200)
Troponin i, poc: <0.05 ng/mL (ref 0.00–0.09)

## 2010-06-21 LAB — APTT: aPTT: 29 seconds (ref 24–37)

## 2010-07-19 LAB — CBC
HCT: 42.5 % (ref 39.0–52.0)
HCT: 42.7 % (ref 39.0–52.0)
HCT: 44.3 % (ref 39.0–52.0)
HCT: 46.7 % (ref 39.0–52.0)
Hemoglobin: 14.4 g/dL (ref 13.0–17.0)
Hemoglobin: 15 g/dL (ref 13.0–17.0)
Hemoglobin: 15.3 g/dL (ref 13.0–17.0)
Hemoglobin: 15.9 g/dL (ref 13.0–17.0)
MCHC: 33.8 g/dL (ref 30.0–36.0)
MCHC: 34 g/dL (ref 30.0–36.0)
MCHC: 34.5 g/dL (ref 30.0–36.0)
MCHC: 35.3 g/dL (ref 30.0–36.0)
MCV: 94 fL (ref 78.0–100.0)
MCV: 94.8 fL (ref 78.0–100.0)
MCV: 95.1 fL (ref 78.0–100.0)
MCV: 95.8 fL (ref 78.0–100.0)
Platelets: 129 10*3/uL — ABNORMAL LOW (ref 150–400)
Platelets: 129 10*3/uL — ABNORMAL LOW (ref 150–400)
Platelets: 133 10*3/uL — ABNORMAL LOW (ref 150–400)
Platelets: 152 10*3/uL (ref 150–400)
RBC: 4.46 MIL/uL (ref 4.22–5.81)
RBC: 4.53 MIL/uL (ref 4.22–5.81)
RBC: 4.67 MIL/uL (ref 4.22–5.81)
RBC: 4.91 MIL/uL (ref 4.22–5.81)
RDW: 12.8 % (ref 11.5–15.5)
RDW: 12.8 % (ref 11.5–15.5)
RDW: 12.9 % (ref 11.5–15.5)
RDW: 13.1 % (ref 11.5–15.5)
WBC: 10.6 10*3/uL — ABNORMAL HIGH (ref 4.0–10.5)
WBC: 8.1 10*3/uL (ref 4.0–10.5)
WBC: 8.4 10*3/uL (ref 4.0–10.5)
WBC: 9.6 10*3/uL (ref 4.0–10.5)

## 2010-07-19 LAB — PROTIME-INR
INR: 1.2 (ref 0.00–1.49)
Prothrombin Time: 15.2 seconds (ref 11.6–15.2)

## 2010-07-19 LAB — TROPONIN I
Troponin I: 16.07 ng/mL (ref 0.00–0.06)
Troponin I: 2.27 ng/mL (ref 0.00–0.06)
Troponin I: 5.92 ng/mL (ref 0.00–0.06)

## 2010-07-19 LAB — HEPATIC FUNCTION PANEL
ALT: 38 U/L (ref 0–53)
AST: 56 U/L — ABNORMAL HIGH (ref 0–37)
Albumin: 4 g/dL (ref 3.5–5.2)
Alkaline Phosphatase: 65 U/L (ref 39–117)
Bilirubin, Direct: 0.6 mg/dL — ABNORMAL HIGH (ref 0.0–0.3)
Indirect Bilirubin: 0.8 mg/dL (ref 0.3–0.9)
Total Bilirubin: 1.4 mg/dL — ABNORMAL HIGH (ref 0.3–1.2)
Total Protein: 7.3 g/dL (ref 6.0–8.3)

## 2010-07-19 LAB — DIFFERENTIAL
Basophils Absolute: 0 10*3/uL (ref 0.0–0.1)
Basophils Absolute: 0 10*3/uL (ref 0.0–0.1)
Basophils Absolute: 0.1 10*3/uL (ref 0.0–0.1)
Basophils Relative: 0 % (ref 0–1)
Basophils Relative: 1 % (ref 0–1)
Basophils Relative: 1 % (ref 0–1)
Eosinophils Absolute: 0.1 10*3/uL (ref 0.0–0.7)
Eosinophils Absolute: 0.1 10*3/uL (ref 0.0–0.7)
Eosinophils Absolute: 0.1 10*3/uL (ref 0.0–0.7)
Eosinophils Relative: 1 % (ref 0–5)
Eosinophils Relative: 1 % (ref 0–5)
Eosinophils Relative: 1 % (ref 0–5)
Lymphocytes Relative: 22 % (ref 12–46)
Lymphocytes Relative: 26 % (ref 12–46)
Lymphocytes Relative: 26 % (ref 12–46)
Lymphs Abs: 1.8 10*3/uL (ref 0.7–4.0)
Lymphs Abs: 2.1 10*3/uL (ref 0.7–4.0)
Lymphs Abs: 2.5 10*3/uL (ref 0.7–4.0)
Monocytes Absolute: 0.6 10*3/uL (ref 0.1–1.0)
Monocytes Absolute: 0.7 10*3/uL (ref 0.1–1.0)
Monocytes Absolute: 0.9 10*3/uL (ref 0.1–1.0)
Monocytes Relative: 11 % (ref 3–12)
Monocytes Relative: 7 % (ref 3–12)
Monocytes Relative: 9 % (ref 3–12)
Neutro Abs: 5 10*3/uL (ref 1.7–7.7)
Neutro Abs: 5.7 10*3/uL (ref 1.7–7.7)
Neutro Abs: 6.2 10*3/uL (ref 1.7–7.7)
Neutrophils Relative %: 61 % (ref 43–77)
Neutrophils Relative %: 65 % (ref 43–77)
Neutrophils Relative %: 69 % (ref 43–77)

## 2010-07-19 LAB — HEPARIN LEVEL (UNFRACTIONATED): Heparin Unfractionated: <0.1 IU/mL — ABNORMAL LOW (ref 0.30–0.70)

## 2010-07-19 LAB — LIPID PANEL
Cholesterol: 143 mg/dL (ref 0–200)
Cholesterol: 157 mg/dL (ref 0–200)
HDL: 27 mg/dL — ABNORMAL LOW (ref >39)
HDL: 29 mg/dL — ABNORMAL LOW (ref >39)
LDL Cholesterol: 110 mg/dL — ABNORMAL HIGH (ref 0–99)
LDL Cholesterol: 98 mg/dL (ref 0–99)
Total CHOL/HDL Ratio: 5.3 RATIO
Total CHOL/HDL Ratio: 5.4 RATIO
Triglycerides: 90 mg/dL (ref <150)
Triglycerides: 90 mg/dL (ref <150)
VLDL: 18 mg/dL (ref 0–40)
VLDL: 18 mg/dL (ref 0–40)

## 2010-07-19 LAB — POCT I-STAT, CHEM 8
BUN: 13 mg/dL (ref 6–23)
Calcium, Ion: 1.03 mmol/L — ABNORMAL LOW (ref 1.12–1.32)
Chloride: 108 mEq/L (ref 96–112)
Creatinine, Ser: 0.9 mg/dL (ref 0.4–1.5)
Glucose, Bld: 85 mg/dL (ref 70–99)
HCT: 49 % (ref 39.0–52.0)
Hemoglobin: 16.7 g/dL (ref 13.0–17.0)
Potassium: 5.3 mEq/L — ABNORMAL HIGH (ref 3.5–5.1)
Sodium: 140 mEq/L (ref 135–145)
TCO2: 25 mmol/L (ref 0–100)

## 2010-07-19 LAB — BASIC METABOLIC PANEL
BUN: 10 mg/dL (ref 6–23)
BUN: 11 mg/dL (ref 6–23)
CO2: 22 mEq/L (ref 19–32)
CO2: 24 mEq/L (ref 19–32)
Calcium: 8.5 mg/dL (ref 8.4–10.5)
Calcium: 8.5 mg/dL (ref 8.4–10.5)
Chloride: 107 mEq/L (ref 96–112)
Chloride: 108 mEq/L (ref 96–112)
Creatinine, Ser: 0.76 mg/dL (ref 0.4–1.5)
Creatinine, Ser: 0.89 mg/dL (ref 0.4–1.5)
GFR calc Af Amer: >60 mL/min (ref >60)
GFR calc Af Amer: >60 mL/min (ref >60)
GFR calc non Af Amer: >60 mL/min (ref >60)
GFR calc non Af Amer: >60 mL/min (ref >60)
Glucose, Bld: 86 mg/dL (ref 70–99)
Glucose, Bld: 90 mg/dL (ref 70–99)
Potassium: 3.7 mEq/L (ref 3.5–5.1)
Potassium: 3.8 mEq/L (ref 3.5–5.1)
Sodium: 138 mEq/L (ref 135–145)
Sodium: 139 mEq/L (ref 135–145)

## 2010-07-19 LAB — CARDIAC PANEL(CRET KIN+CKTOT+MB+TROPI)
CK, MB: 17.4 ng/mL — ABNORMAL HIGH (ref 0.3–4.0)
CK, MB: 27.4 ng/mL — ABNORMAL HIGH (ref 0.3–4.0)
CK, MB: 3.5 ng/mL (ref 0.3–4.0)
Relative Index: 2.9 — ABNORMAL HIGH (ref 0.0–2.5)
Relative Index: 6.7 — ABNORMAL HIGH (ref 0.0–2.5)
Relative Index: 7.6 — ABNORMAL HIGH (ref 0.0–2.5)
Total CK: 122 U/L (ref 7–232)
Total CK: 260 U/L — ABNORMAL HIGH (ref 7–232)
Total CK: 361 U/L — ABNORMAL HIGH (ref 7–232)
Troponin I: 3.4 ng/mL (ref 0.00–0.06)
Troponin I: 4.81 ng/mL (ref 0.00–0.06)
Troponin I: 5.8 ng/mL (ref 0.00–0.06)

## 2010-07-19 LAB — CK TOTAL AND CKMB (NOT AT ARMC)
CK, MB: 53.2 ng/mL — ABNORMAL HIGH (ref 0.3–4.0)
CK, MB: 72.1 ng/mL — ABNORMAL HIGH (ref 0.3–4.0)
CK, MB: 88.8 ng/mL — ABNORMAL HIGH (ref 0.3–4.0)
Relative Index: 11.8 — ABNORMAL HIGH (ref 0.0–2.5)
Relative Index: 13.3 — ABNORMAL HIGH (ref 0.0–2.5)
Relative Index: 9.9 — ABNORMAL HIGH (ref 0.0–2.5)
Total CK: 540 U/L — ABNORMAL HIGH (ref 7–232)
Total CK: 612 U/L — ABNORMAL HIGH (ref 7–232)
Total CK: 670 U/L — ABNORMAL HIGH (ref 7–232)

## 2010-07-19 LAB — POCT CARDIAC MARKERS
CKMB, poc: 22 ng/mL (ref 1.0–8.0)
Myoglobin, poc: 362 ng/mL (ref 12–200)
Troponin i, poc: 1.1 ng/mL (ref 0.00–0.09)

## 2010-07-19 LAB — HEMOGLOBIN A1C
Hgb A1c MFr Bld: 5.4 % (ref 4.6–6.1)
Mean Plasma Glucose: 108 mg/dL

## 2010-07-19 LAB — APTT: aPTT: 31 seconds (ref 24–37)

## 2010-07-19 LAB — TSH: TSH: 1.762 u[IU]/mL (ref 0.350–4.500)

## 2010-08-14 ENCOUNTER — Encounter: Payer: Self-pay | Admitting: *Deleted

## 2010-08-17 ENCOUNTER — Encounter: Payer: Self-pay | Admitting: Cardiology

## 2010-08-17 ENCOUNTER — Ambulatory Visit (INDEPENDENT_AMBULATORY_CARE_PROVIDER_SITE_OTHER): Payer: Commercial Managed Care - PPO | Admitting: Cardiology

## 2010-08-17 VITALS — BP 162/84 | HR 68 | Ht 64.0 in | Wt 197.0 lb

## 2010-08-17 DIAGNOSIS — Z720 Tobacco use: Secondary | ICD-10-CM | POA: Insufficient documentation

## 2010-08-17 DIAGNOSIS — F17200 Nicotine dependence, unspecified, uncomplicated: Secondary | ICD-10-CM

## 2010-08-17 DIAGNOSIS — E7800 Pure hypercholesterolemia, unspecified: Secondary | ICD-10-CM

## 2010-08-17 DIAGNOSIS — I10 Essential (primary) hypertension: Secondary | ICD-10-CM

## 2010-08-17 DIAGNOSIS — I2510 Atherosclerotic heart disease of native coronary artery without angina pectoris: Secondary | ICD-10-CM

## 2010-08-17 MED ORDER — METOPROLOL TARTRATE 50 MG PO TABS: 50.0000 mg | ORAL_TABLET | Freq: Two times a day (BID) | ORAL | Status: DC

## 2010-08-17 MED ORDER — SIMVASTATIN 40 MG PO TABS: 40.0000 mg | ORAL_TABLET | Freq: Every day | ORAL | Status: DC

## 2010-08-17 NOTE — Discharge Summary (Signed)
[name] [name], [name] [name] NO.:  [id number]   MEDICAL RECORD NO.:  [id number]          PATIENT TYPE:  INP   LOCATION:  5945                         FACILITY:  [facility]   PHYSICIAN:  Loretha Brasil. Lia Foyer, MD, FACCDATE OF BIRTH:  16-Feb-1951   DATE OF ADMISSION:  04/29/2008  DATE OF DISCHARGE:                               DISCHARGE SUMMARY   PRIMARY CARDIOLOGIST:  Minus Breeding, MD, [facility]   PRIMARY CARE PHYSICIAN:  None.   DISCHARGE DIAGNOSIS:  Inferior myocardial infarction.   SECONDARY DIAGNOSES:  1. Dyslipidemia.  2. Tobacco abuse disorder.  3. Prehypertensive.   ALLERGIES:  NKDA.   PROCEDURES PERFORMED DURING THIS HOSPITALIZATION:  On April 29, 2008,  the patient had an electrocardiogram that showed sinus rhythm, rate 59,  axis WNL, intervals WNL, inferior Q-waves subtle but new, inferior T-  wave inversions in inferior leads.  On April 29, 2008, the patient had  chest x-ray, which showed no acute disease.  On April 29, 2008, the  patient had,  1. Left heart catheterization.  2. Selective coronary angiography.  3. Selective left ventriculography.  4. Percutaneous stenting of the right coronary artery with a drug-      eluting stent.   On May 02, 2008, the patient had an electrocardiogram, which showed  sinus bradycardia with a rate of 59, minimal voltage criteria for LVH  and Q-waves in lead III and aVF consistent with prior inferior infarct.  No acute ST-T wave changes.  The patient still had some T-wave inversion  is lead III and less so in lead aVF, and some T-wave flattening in V6.   HISTORY OF PRESENT ILLNESS:  This patient is a 60 year old white male  with no prior cardiac history.  He has had chest and arm discomfort  intermittently since December.  It may come on with exertion or at rest.  Some of these episodes are severe per the patient and predominantly  located in his right arm.  On the day of evaluation in the emergency  department, the  patient began to have much more severe burning chest  pain around 3:30 at work and also right arm discomfort.  It waxed and  waned all day and the most severe pain occurred between 4:30 and 5 p.m.  He decided at that time to go home, but eventually decided due to the  nonresolution of symptoms to present to the ER around 9 p.m.  At that  time it was noted that he had subtle T-wave inversion in his inferior  leads.  He was given some aspirin and his discomfort significantly  improved from 7 out of 10 to approximately 0 out of 10.  The patient  ruled in for myocardial infarction with elevated CK-MB and troponin.  A  repeat EKG showed more pronounced T-wave inversion in inferior leads,  however, at that time he was pain-free.  The patient reports having  occasional nausea associated with the pain as well as shortness of  breath.  He denies resting shortness of breath, PND, orthopnea,  palpitations, presyncope, or syncope.   HOSPITAL COURSE:  After being  evaluated in the ER and ruling in for an  NSTEMI, the patient was sent for same day cardiac catheterization and  was deemed to have 100% blockage.  It showed that an acute marginal  branch was totally occluded off the RCA.  After reproduction, the vessel  opened up and there was a segmental area of diffuse disease lying across  the origin of the PDA.  The PDA had 40% narrowing.  Cardiac  catheterization showed 100% stenosis of distal RCA, which was reduced to  0% after successful percutaneous stenting with Xience V drug-eluting  stent.  The left main was free of critical disease.  There was some  luminal irregularity throughout the LAD without critical narrowing.  The  ramus intermedius has about 40%-50% mid irregularity.  The AV circumflex  is without critical disease, however, there is luminal irregularity.  Ventriculography demonstrates EF in the 50% range.  There is inferobasal  hypokinesis.  The patient tolerated this procedure well  without  significant complications.  The patient was asymptomatic on May 01, 2008, reporting generally feeling better.  Vital signs remains stable.  Labs unremarkable and no significant EKG changes.  The patient remains  in stable condition, generally improving on January 29 and deemed  appropriate for discharge.  The patient was receiving cardiac rehab and  was given information regarding outpatient followup with cardiac rehab,  but decided to decline at this time.  The patient also received tobacco  cessation counseling inpatient and was given followup information as he  reported being highly motivated to quit.  The patient was also enrolled  in the Tracer research study while inpatient and was given all the  followup information regarding the clinical trial appointment schedule.  The patient was given written and oral instructions as to his medication  list and followup appointments and at the time of discharge had no  questions or concerns.   DISCHARGE LABORATORY DATA:  WBC 8.1, HGB 14.4, HCT 42.7, PLT count 129.  Cardiac enzymes down trending, although still mildly elevated with a CK-  MB of 3.5, CK of 122, relative index 2.9, troponin I of 3.4, cholesterol  143, triglycerides 90, HDL 27, LDL 98, VLDL 18, and TSH 1.762.   FOLLOWUP APPOINTMENT:  The patient has a followup appointment with Minus Breeding on May 23, 2008 at 10:45 a.m.   DISCHARGE MEDICATIONS:  1. Tracer research study drug.  2. Aspirin enteric-coated 325 mg p.o. daily.  3. Plavix 75 mg p.o. daily.  4. Metoprolol 12.5 mg p.o. b.i.d.  5. Simvastatin 40 mg p.o. daily.  6. Nitroglycerin 0.4 mg sublingual p.r.n. for chest pain.   DURATION OF DISCHARGE ENCOUNTER:  Including physician time was 45  minutes.      Guss Bunde, PAC      Loretha Brasil. Lia Foyer, MD, [facility]  Electronically Signed    MS/MEDQ  D:  05/02/2008  T:  05/03/2008  Job:  [id number]   cc:   Loretha Brasil. Lia Foyer, MD, [facility]  Minus Breeding,  MD, [facility]

## 2010-08-17 NOTE — H&P (Signed)
NAMEELION, HOCKER NO.:  [id number]   MEDICAL RECORD NO.:  [id number]          PATIENT TYPE:  INP   LOCATION:  2301                         FACILITY:  [place]   PHYSICIAN:  Minus Breeding, MD, FACCDATE OF BIRTH:  29-Mar-1951   DATE OF ADMISSION:  04/29/2008  DATE OF DISCHARGE:                              HISTORY & PHYSICAL   The primary is none.  The cardiologist is none.   REASON FOR PRESENTATION:  Evaluate patient with chest pain.   HISTORY OF PRESENT ILLNESS:  The patient is pleasant 60 year old white  gentleman without prior cardiac history.  He does say that has had chest  and arm discomfort off and on since December.  It has happened  sporadically.  It may happen with exertion but may happen at rest.  He  has had some severe episodes.  The discomfort has predominately been in  his right arm but occasionally in his left.  It is a burning discomfort.  He had not had it prior to this.  Today, he had worse pain.  It happened  around 3:30 while at work.  He had a right arm discomfort and a burning  chest discomfort.  It waxed and waned all day.  He said he had the most  severe pain around 4:30 or 5:00.  He actually laid down at work and then  went home.  Because the pain was waxing and waning and not going away,  he finally came to the emergency room around 9:00.  He had some subtle T-  wave inversions in his inferior leads.  He was given some aspirin.  He  subsequently had resolution of his discomfort.  At its peak, it had been  about 7/10 in the early afternoon before he got to the ER.  It is now  0/10.  However, his enzymes came back elevated with a CK of 362 and MB  of 22 and a troponin of 1.1.  Repeat EKG around 11:00 demonstrated Q-  waves in the inferior leads.  He has early transition of lead V2, and  the inverted T-waves in II, III and aVF were more prominent.  Again, he  is pain free.   He says that he never had this discomfort prior to Christmas.   With it,  he has had occasional nausea.  He has not had any diaphoresis.  He gets  short of breath with moderate activity, and this has been relatively  new.  He does not have any resting shortness breath.  He does not have  any PND or orthopnea.  He has had no palpitations, presyncope or  syncope.   PAST MEDICAL HISTORY:  1. Hypertension off and on (the patient does not see any doctors and      has not had his lipids checked in a few years).  2. Nephrolithiasis.   PAST SURGICAL HISTORY:  None.   ALLERGIES:  None.   MEDICATIONS:  None.   SOCIAL HISTORY:  The patient is married.  He has 3 children.  He works  as a Games developer.  He has been smoking at  least 1 pack per day for 40  years.   FAMILY HISTORY:  Noncontributory for early coronary disease but is  contributory for strong family history of cancer.   REVIEW OF SYSTEMS:  As stated in the HPI and negative for all other  systems.   PHYSICAL EXAMINATION:  The patient is very pleasant and in no distress.  Blood pressure 153/82, heart rate 61 and regular, respiratory rate 16,  afebrile.  HEENT:  Eyelids are unremarkable; pupils equal, round and reactive to  light; fundi not visualized; oral mucosa unremarkable; poor dentition.  NECK: No jugular venous distention at 45 degrees; carotid upstrokes  brisk and symmetric; no bruits; no thyromegaly.  LYMPHATICS:  No cervical, axillary, inguinal adenopathy.  LUNGS:  Clear to auscultation bilaterally.  BACK:  No costovertebral angle tenderness.  CHEST:  Unremarkable.  HEART:  PMI not displaced or sustained, S1-S2 within normal.  No S3, no  S4; no clicks, no rubs, no murmurs.  ABDOMEN:  Obese, positive bowel sounds, normal in frequency and pitch,  no bruits, no rebound, no guarding, no midline pulsatile mass, no  hepatomegaly, no splenomegaly.  SKIN:  No rashes, no nodules.  EXTREMITIES:  2+ pulses throughout, no edema, no cyanosis, no clubbing.  NEUROLOGICAL:  Oriented to person,  place, and time; cranial nerves II-  XII grossly intact; motor grossly intact.   EKG:  Sinus rhythm, rate 59, axis within normal limits, intervals within  normal limits, inferior Q-waves subtle but new, inferior T-wave  inversions as described previously, early transition lead V2.   LABORATORY DATA:  WBC 9.6, hemoglobin 15.9, platelets 152.  Sodium 140,  potassium 5.3, BUN 13, creatinine 0.9.   Chest x-ray:  No acute disease.   ASSESSMENT/PLAN:  1. Inferior myocardial infarction out of hospital.  He is now about 6      hours after the most intense discomfort, and he is pain free.  He      does have Q-waves but no acute ST elevation.  He has T-wave      inversions.  At this point, he has either completed an infarct or      has some reperfusion.  There is no urgent indication for      catheterization.  I am going to manage him with heparin, beta      blockers if his heart rate will tolerate, IV nitroglycerin as he is      slightly hypertensive, aspirin and Plavix.  He will have elective      cardiac catheterization or more urgent catheterization if he has      any acute symptoms.  2. Tobacco.  He needs to be educated about the need to stop smoking.  3. Risk reduction.  Will check a lipid profile, hemoglobin A1c and      TSH.  4. Obesity.  Will educate him about this.  5. Hypertension.  Blood pressure is slightly elevated.  He will leave      here on some regimen for management of this.      Minus Breeding, MD, [facility]  Electronically Signed     JH/MEDQ  D:  04/30/2008  T:  04/30/2008  Job:  [id number]

## 2010-08-17 NOTE — Assessment & Plan Note (Signed)
The patient has no new sypmtoms.  No further cardiovascular testing is indicated.  We will continue with aggressive risk reduction and meds as listed.  

## 2010-08-17 NOTE — Assessment & Plan Note (Signed)
[facility]                            CARDIOLOGY OFFICE NOTE   NAME:[name], [name] FRASIER                 MRN:          [id number]  DATE:05/26/2008                            DOB:          1950/10/26    PRIMARY CARE PHYSICIAN:  None.   REASON FOR PRESENTATION:  Evaluate the patient with a rash.   HISTORY OF PRESENT ILLNESS:  The patient is a very pleasant gentleman  who had stenting of a distal right coronary artery on April 29, 2008.  He has been on Plavix and also study drug for the PLATO study.  He had a  drug-eluting stent placed at that time.  He developed a rash over the  last couple of days.  It is a patchy red rash somewhat purulent on his  chest, now spreading to his arms and his legs.  He has had no fevers or  chills.  He has had no nausea, vomiting, or diaphoresis.  He has taken  some Benadryl, which seems to help the itching.  He denies any chest  discomfort similar to his previous angina.  However, he is still getting  some reflux.  He tried to take Zantac as I suggested.  However, the only  thing that seems to work is an occasional Prilosec.   PAST MEDICAL HISTORY:  1. Coronary artery disease (see the May 06, 2008 note for      details).  2. Hypertension.  3. Nephrolithiasis.  4. Obesity.  5. Gastroesophageal reflux disease.  6. Previous tobacco use.  7. Dyslipidemia.   ALLERGIES AND INTOLERANCES:  None.   MEDICATIONS:  1. Plavix 75 mg daily.  2. Simvastatin 40 mg daily.  3. Aspirin 325 mg daily.  4. Metoprolol 25 mg b.i.d.   REVIEW OF SYSTEMS:  As stated in the HPI, and otherwise negative for  other systems.   PHYSICAL EXAMINATION:  GENERAL:  The patient is in no distress.  VITAL SIGNS:  Blood pressure 132/74, heart rate 76 and regular, and  weight 197 pounds.  SKIN:  Diffuse raised red rash as described, otherwise unchanged.  EXTREMITIES:  No edema and 2+ pulses.  LUNGS:  Clear to auscultation.  HEART:  S1 and  S2 within normal limits; no S3, no S4; no clicks, no  rubs, no murmurs.   ASSESSMENT AND PLAN:  1. Rash.  The patient has a rash most likely related to Plavix.  He      needs to come off of this drug.  I am going to start Effient 10 mg      daily.  He will remain on the study drug.  He will let us know if      the rash does not improve.  2. Reflux.  Unfortunately, the only thing that seems to help with this      chest discomfort is the Prilosec.  I told him I have no data about      an interaction between Prilosec and Effient.  Also, I would suspect      that infrequent use of this would carry a lower risk of interaction  if one exists.  It is the only thing that seems to help.  He will      continue to try the Zantac on chronic basis, but take the Prilosec      only if needed.  I would suggest he follow up with his      gastroenterologist as well.  3. Followup.  I was going to see him back next week, but we will move      this appointment back for 1 month.     Minus Breeding, MD, [facility]  Electronically Signed    JH/MedQ  DD: 05/26/2008  DT: 05/27/2008  Job #: [id number]

## 2010-08-17 NOTE — Assessment & Plan Note (Signed)
I spent a long time (greater than 3 minutes) discussing the need to stop smoking. He does not want any medications or other therapies for this at present.

## 2010-08-17 NOTE — Cardiovascular Report (Signed)
NAMEJAYMESON, MENGEL NO.:  [id number]   MEDICAL RECORD NO.:  [id number]          PATIENT TYPE:  INP   LOCATION:  2507                         FACILITY:  [place]   PHYSICIAN:  Loretha Brasil. Lia Foyer, MD, FACCDATE OF BIRTH:  1950-07-27   DATE OF PROCEDURE:  04/29/2008  DATE OF DISCHARGE:                            CARDIAC CATHETERIZATION   INDICATIONS:  [name] [name] is a 60 year old gentleman who presented with  chest pain.  Enzymes rose there were minimal inferior ST-segment  abnormalities.  He was brought to the Catheterization Laboratory pain  free, approximately 14-16 hours after his pain.   PROCEDURES:  1. Left heart catheterization.  2. Selective coronary arteriography.  3. Selective left ventriculography.  4. Percutaneous stenting of the right coronary artery with a drug-      eluting stent.   DESCRIPTION OF PROCEDURE:  The patient was brought to the Cath Lab,  prepped and draped in usual fashion.  Through an anterior puncture, the  right femoral artery was easily entered.  A 6-French sheath was placed.  Views of left and right coronary arteries were obtained in multiple  angiographic projections. Central aortic left ventricular pressures were  measured with pigtail catheter.  Ventriculography was performed in the  RAO projection.  We then elected to proceed with percutaneous  intervention.  I discussed this with the patient prior to the procedure.  Bivalirudin was given according to the protocol.  The patient had been  previously loaded with clopidogrel.  The right coronary artery was then  engaged with a JR-4 guiding catheter with side holes.  ACT was checked  and found to be appropriate for percutaneous intervention.  A [place]  wire was then placed down in the distal vessel and across the site of  total occlusion.  We placed a small 1.5-mm balloon and after several  dilatations, it was evident that the vessel was somewhat bigger.  A 2-mm  balloon was then  placed with 6 atmospheres inflation, and the distal  extent of the artery was then well seen.  Following this, intracoronary  nitroglycerin was given to better identify the size and margins of the  vessel.  We elected to start across the PDA into the continuation branch  using a 2.75 x 28 Xience V drug-eluting stent.  This was then post-  dilated fairly aggressively with 3-mm [place] Voyager and then more  proximally, in a larger area of the vessel with a 3.5 [place] Voyager.  There  was marked improvement in the appearance of the artery.  He tolerated  the procedure well.  Femoral sheath was sewn into place, and he was  taken to the holding area in satisfactory clinical condition.   HEMODYNAMIC DATA:  1. The central aortic pressure was 136/70, mean 97.  2. Left ventricular pressure was 141/19.  3. There was no gradient on pullback across the aortic valve.   ANGIOGRAPHIC DATA:  1. The right coronary artery was a moderate-sized vessel.  It provided      an acute marginal branch that was totally occluded distally.  After      re-perfusion,  the vessel opened up and there was a segmental area      of diffuse disease lying across the origin of the PDA.  The PDA      itself had about 40% narrowing.  Following stenting of this, this      was reduced from 100 down to 0%.  There was diffuse plaquing of 40-      50% in the large posterolateral branch.  This vessel was filled by      collaterals prior to intervention.  2. The left main is free of critical disease.  3. The LAD courses to the apex.  There was some mild luminal      irregularity throughout without critical narrowing.  4. The ramus intermedius has about 40-50% mid irregularity.  5. The AV circumflex is without critical disease.  There is luminal      irregularity.  6. Ventriculography demonstrates the ejection fraction in the 50%      range.  There is inferobasal hypokinesis.   CONCLUSION:  1. Successful percutaneous stenting of the right  coronary artery.  2. Total occlusion of the right coronary artery.  3. Scattered disease as noted above.  The patient will be treated      medically.  Followup will be with Dr. Percival Spanish.  He needs      continuous dual antiplatelet therapy for minimum of a year,      continuous aspirin thereafter.  Discontinuation of Plavix will be      at the discretion of his following cardiologist.      Loretha Brasil. Lia Foyer, MD, [facility]  Electronically Signed     TDS/MEDQ  D:  04/30/2008  T:  05/01/2008  Job:  [id number]   cc:   Minus Breeding, MD, [facility]  CV Laboratory

## 2010-08-17 NOTE — Assessment & Plan Note (Signed)
I will check a lipid profile with a goal LDL less than 70 and HDL greater than 50.

## 2010-08-17 NOTE — Patient Instructions (Signed)
Your physician recommends that you schedule a follow-up appointment in: 12 months with Dr. Percival Spanish Your physician recommends that you return for lab work in:  Lipid and Liver panel today.

## 2010-08-17 NOTE — Progress Notes (Signed)
HPI The patient presents for yearly followup. Since I last saw him he has had no new cardiovascular complaints. He remains active doing some exercising without any chest discomfort, neck or arm discomfort. He has had no palpitations, presyncope or syncope. He has not had is lipids checked as he does not currently have a primary care doctor. Unfortunately he started smoking cigarettes.  Allergies  Allergen Reactions  . Clopidogrel Bisulfate     Current Outpatient Prescriptions  Medication Sig Dispense Refill  . aspirin 325 MG tablet Take 325 mg by mouth daily.        . metoprolol (LOPRESSOR) 50 MG tablet Take 1 tablet (50 mg total) by mouth 2 (two) times daily.  60 tablet  6  . nitroGLYCERIN (NITROSTAT) 0.4 MG SL tablet Place 0.4 mg under the tongue every 5 (five) minutes as needed.        . prasugrel (EFFIENT) 10 MG TABS Take 10 mg by mouth daily.        . simvastatin (ZOCOR) 40 MG tablet Take 1 tablet (40 mg total) by mouth at bedtime.  30 tablet  6  . DISCONTD: metoprolol (LOPRESSOR) 50 MG tablet Take 50 mg by mouth 2 (two) times daily.        Marland Kitchen DISCONTD: simvastatin (ZOCOR) 40 MG tablet Take 40 mg by mouth at bedtime.          Past Medical History  Diagnosis Date  . Coronary artery disease     cardiac catheterization on January 26,2010, right coronary arter distal occlusion with stenting of this with xience drug-eluting stent.  Pt also had liminal irregularities  in the LAD and 40-50% mid circumflex  stenosis  . Hypertension   . Nephrolithiasis   . Myocardial infarction     Past Surgical History  Procedure Date  . Coronary angioplasty with stent placement     ROS: As stated in the HPI and negative for all other systems.  PHYSICAL EXAM BP 162/84  Pulse 68  Ht 5' 4"  (1.626 m)  Wt 197 lb (89.359 kg)  BMI 33.82 kg/m2 GENERAL:  Well appearing HEENT:  Pupils equal round and reactive, fundi not visualized, oral mucosa unremarkable NECK:  No jugular venous distention, waveform  within normal limits, carotid upstroke brisk and symmetric, no bruits, no thyromegaly LYMPHATICS:  No cervical, inguinal adenopathy LUNGS:  Clear to auscultation bilaterally BACK:  No CVA tenderness CHEST:  Unremarkable HEART:  PMI not displaced or sustained,S1 and S2 within normal limits, no S3, no S4, no clicks, no rubs, no murmurs ABD:  Flat, positive bowel sounds normal in frequency in pitch, no bruits, no rebound, no guarding, no midline pulsatile mass, no hepatomegaly, no splenomegaly EXT:  2 plus pulses throughout, no edema, no cyanosis no clubbing SKIN:  No rashes no nodules NEURO:  Cranial nerves II through XII grossly intact, motor grossly intact throughout PSYCH:  Cognitively intact, oriented to person place and time.  EKG:  Sinus rhythm, rate, axis within normal limits, intervals within normal limits, no acute ST-T wave changes, RSR' V1   ASSESSMENT AND PLAN

## 2010-08-17 NOTE — Assessment & Plan Note (Signed)
[facility]                            CARDIOLOGY OFFICE NOTE   NAME:[name], [name] GOELLER                 MRN:          [id number]  DATE:05/06/2008                            DOB:          1950/04/26    PRIMARY CARE PHYSICIAN:  None.   REASON FOR PRESENTATION:  Evaluate the patient with recent inferior  myocardial infarction.   HISTORY OF PRESENT ILLNESS:  The patient presents for followup.  He was  late presentation for inferior myocardial infarction.  He subsequently  had a cardiac catheterization on April 29, 2008, by Dr. Lia Foyer.  He  was found to have right coronary artery distal occlusion.  The patient  had stenting of this with Xience drug-eluting stent.  The patient also  had a luminal irregularities in the LAD and 40-50% mid circumflex  stenosis (EF is about 50% with inferobasilar hypokinesis), he was due to  see me in mid February.  However, he had some chest discomfort last  night.  We asked him to come in.  He describes this as a burning  discomfort in his midepigastric area after he ate some meat loaf.  It  was similar to previous reflux.  It was not similar to his angina that  he had the other day.  He had some burping.  He took half Tums, and he  thinks the symptoms abated.  There was no radiation to his jaw or to his  arms.  There was no nausea, vomiting, or diaphoresis.  It was moderate  in intensity.  He has otherwise not been particularly active though he  is doing a little walking.  He has not been having any PND or orthopnea.  He has not been having any palpitation, presyncope, or syncope.  He has  been having some mild dizziness with standing.   PAST MEDICAL HISTORY:  1. Coronary artery disease as described.  2. Hypertension.  3. Nephrolithiasis.   ALLERGIES AND INTOLERANCES:  None.   MEDICATIONS:  1. Plavix 75 mg daily.  2. Aspirin 325 mg daily.  3. Metoprolol 12.5 mg b.i.d.  4. Simvastatin 40 mg daily.  5. Tracer  study drug.  6. Sublingual nitroglycerin.   REVIEW OF SYSTEMS:  As stated in the HPI and otherwise negative for all  other systems.   PHYSICAL EXAMINATION:  GENERAL:  The patient is pleasant and in no  distress.  VITAL SIGNS:  Blood pressure 143/96, heart rate 80 and regular, weight  193 pounds, and body mass index 33.  HEENT:  Eyelids unremarkable, pupils equal, round, and reactive to  light, fundi not visualized, oral mucosa unremarkable.  NECK:  No jugular venous distention at 45 degrees, carotid upstroke  brisk and symmetric, no bruits, no thyromegaly.  LYMPHATICS:  No cervical, axillary, or inguinal adenopathy.  LUNGS:  Clear to auscultation bilaterally.  BACK:  No costovertebral angle tenderness.  CHEST:  Unremarkable.  HEART:  PMI not displaced or sustained, S1 and S2 within normal limits,  no S3, no S4, no clicks, no rubs, no murmurs.  ABDOMEN:  Obese, positive bowel sounds, normal in frequency and pitch,  no bruits, no  rebound, no guarding, no midline pulsatile mass, no  hepatomegaly, no splenomegaly.  SKIN:  No rashes, no nodules.  EXTREMITIES:  Pulses 2+ throughout, no edema, no cyanosis, no clubbing.  NEURO:  Oriented to person, place, and time, cranial nerves II through  XII grossly intact, motor grossly intact.   EKG sinus rhythm, rate 73, axis within normal limits, intervals within  normal limits, old inferior infarct, probable posterior involvement,  inferior T-wave inversions.   ASSESSMENT AND PLAN:  1. Chest.  The patient has chest discomfort that he described last      night, it was atypical and similar to his previous reflux.  It was      not at all like his recent angina.  Therefore, I do not think      further cardiovascular testing is suggested.  He should continue      with aggressive risk reduction.  2. Reflux.  We did discuss not using a proton pump inhibitor, rather      H2 blocker.  I have suggested that he take this over-the-counter      for his  reflux given the possible interaction between PPIs and      Plavix.  3. Tobacco.  He stopped smoking completely!  His wife is on Chantix      and not smoking.  I applauded their effort and I encouraged      continued abstinence.  4. Dyslipidemia.  He is now on the statin.  We will check a lipid      profile in about 8 weeks with a goal LDL less than 100 and HDL      greater than 40.  5. Hypertension.  Blood pressure is slightly elevated.  I am going to      increase his metoprolol to 25 mg twice a day.  Also weight loss      will help.  6. Followup.  I will see him back in March.     Minus Breeding, MD, [facility]  Electronically Signed    JH/MedQ  DD: 05/06/2008  DT: 05/07/2008  Job #: [id number]

## 2010-08-17 NOTE — Assessment & Plan Note (Signed)
The blood pressure continues to be high. I have instructed the patient to record a blood pressure diary and recording this. This will be presented for my review and pending these results I will make further suggestions about changes in therapy for optimal blood pressure control.

## 2010-08-19 ENCOUNTER — Other Ambulatory Visit (INDEPENDENT_AMBULATORY_CARE_PROVIDER_SITE_OTHER): Payer: Commercial Managed Care - PPO | Admitting: *Deleted

## 2010-08-19 DIAGNOSIS — I10 Essential (primary) hypertension: Secondary | ICD-10-CM

## 2010-08-19 DIAGNOSIS — E7800 Pure hypercholesterolemia, unspecified: Secondary | ICD-10-CM

## 2010-08-19 LAB — HEPATIC FUNCTION PANEL
ALT: 23 U/L (ref 0–53)
AST: 22 U/L (ref 0–37)
Albumin: 3.6 g/dL (ref 3.5–5.2)
Alkaline Phosphatase: 51 U/L (ref 39–117)
Bilirubin, Direct: 0.1 mg/dL (ref 0.0–0.3)
Total Bilirubin: 0.6 mg/dL (ref 0.3–1.2)
Total Protein: 6.6 g/dL (ref 6.0–8.3)

## 2010-08-19 LAB — LIPID PANEL
Cholesterol: 89 mg/dL (ref 0–200)
HDL: 28.1 mg/dL — ABNORMAL LOW (ref >39.00)
LDL Cholesterol: 40 mg/dL (ref 0–99)
Total CHOL/HDL Ratio: 3
Triglycerides: 106 mg/dL (ref 0.0–149.0)
VLDL: 21.2 mg/dL (ref 0.0–40.0)

## 2010-08-25 ENCOUNTER — Encounter: Payer: Self-pay | Admitting: *Deleted

## 2010-11-12 ENCOUNTER — Other Ambulatory Visit: Payer: Self-pay | Admitting: Cardiology

## 2010-11-15 ENCOUNTER — Telehealth: Payer: Self-pay | Admitting: *Deleted

## 2010-11-15 NOTE — Telephone Encounter (Signed)
effient 10 mg. [facility] in [place] [phone]

## 2010-11-15 NOTE — Telephone Encounter (Signed)
PT STATED HE IS WORKING WAY TO [employer] CAN NOT SCHEDULE RECALL COLON AT THIS TIME. WILL CALL BACK

## 2011-02-10 ENCOUNTER — Other Ambulatory Visit: Payer: Self-pay

## 2011-02-10 MED ORDER — NITROGLYCERIN 0.4 MG SL SUBL: 0.4000 mg | SUBLINGUAL_TABLET | SUBLINGUAL | Status: DC | PRN

## 2011-02-10 NOTE — Telephone Encounter (Signed)
.   Requested Prescriptions   Signed Prescriptions Disp Refills  . nitroGLYCERIN (NITROSTAT) 0.4 MG SL tablet 25 tablet 12    Sig: Place 1 tablet (0.4 mg total) under the tongue every 5 (five) minutes as needed.    Authorizing Provider: Minus Breeding    Ordering User: Laurence Compton   E[facility]scribe [facility] today.

## 2011-02-14 ENCOUNTER — Other Ambulatory Visit: Payer: Self-pay

## 2011-02-14 MED ORDER — NITROGLYCERIN 0.4 MG SL SUBL: 0.4000 mg | SUBLINGUAL_TABLET | SUBLINGUAL | Status: DC | PRN

## 2011-02-22 ENCOUNTER — Other Ambulatory Visit: Payer: Self-pay

## 2011-02-22 MED ORDER — METOPROLOL TARTRATE 50 MG PO TABS: 50.0000 mg | ORAL_TABLET | Freq: Two times a day (BID) | ORAL | Status: DC

## 2011-03-11 ENCOUNTER — Other Ambulatory Visit: Payer: Self-pay | Admitting: Cardiology

## 2011-04-05 DIAGNOSIS — J189 Pneumonia, unspecified organism: Secondary | ICD-10-CM

## 2011-04-05 HISTORY — DX: Pneumonia, unspecified organism: J18.9

## 2011-04-08 ENCOUNTER — Other Ambulatory Visit: Payer: Self-pay | Admitting: Cardiology

## 2011-07-04 ENCOUNTER — Other Ambulatory Visit: Payer: Self-pay

## 2011-07-04 MED ORDER — PRASUGREL HCL 10 MG PO TABS: 10.0000 mg | ORAL_TABLET | Freq: Every day | ORAL | Status: DC

## 2011-07-04 NOTE — Telephone Encounter (Signed)
..   Requested Prescriptions   Signed Prescriptions Disp Refills  . prasugrel (EFFIENT) 10 MG TABS 90 each 4    Sig: Take 1 tablet (10 mg total) by mouth daily.    Authorizing Provider: Minus Breeding    Ordering User: Ardis Hughs, Markeise Mathews Jerilynn Mages

## 2011-07-05 ENCOUNTER — Other Ambulatory Visit: Payer: Self-pay | Admitting: *Deleted

## 2011-09-02 ENCOUNTER — Encounter: Payer: Self-pay | Admitting: Cardiology

## 2011-09-02 ENCOUNTER — Ambulatory Visit (INDEPENDENT_AMBULATORY_CARE_PROVIDER_SITE_OTHER): Payer: Managed Care, Other (non HMO) | Admitting: Cardiology

## 2011-09-02 VITALS — BP 150/73 | HR 70 | Ht 64.0 in | Wt 209.8 lb

## 2011-09-02 DIAGNOSIS — I2510 Atherosclerotic heart disease of native coronary artery without angina pectoris: Secondary | ICD-10-CM

## 2011-09-02 DIAGNOSIS — E7800 Pure hypercholesterolemia, unspecified: Secondary | ICD-10-CM

## 2011-09-02 MED ORDER — LISINOPRIL 10 MG PO TABS: 10.0000 mg | ORAL_TABLET | Freq: Every day | ORAL | Status: DC

## 2011-09-02 NOTE — Patient Instructions (Signed)
Please stop Effient Start lisinopril 10 mg a day Continue all other medications as listed  Please have fasting blood work in 2 weeks (BMP and lipid profile)  Follow up in one month with Dr Percival Spanish.

## 2011-09-02 NOTE — Assessment & Plan Note (Signed)
We discussed a specific strategy for tobacco cessation.  (Greater than three minutes discussing tobacco cessation.)

## 2011-09-02 NOTE — Assessment & Plan Note (Signed)
I will add lisinopril 10 mg daily and check a BMET in one week.

## 2011-09-02 NOTE — Assessment & Plan Note (Signed)
I will check a lipid profile when he returns for other blood work.

## 2011-09-02 NOTE — Assessment & Plan Note (Signed)
The patient has no new sypmtoms.  No further cardiovascular testing is indicated.  We will continue with aggressive risk reduction and meds as listed.  

## 2011-09-02 NOTE — Progress Notes (Signed)
   HPI The patient presents for yearly followup. Since I last saw him he has had no new complaints.  The patient denies any new symptoms such as chest discomfort, neck or arm discomfort. There has been no new shortness of breath, PND or orthopnea. There have been no reported palpitations, presyncope or syncope. Unfortunately he doesn't participate in secondary risk reduction. He is still smoking cigarettes.  He does no tabuse his diet but isn't particularly strict. He does not exercise routinely.    Allergies  Allergen Reactions  . Clopidogrel Bisulfate     Current Outpatient Prescriptions  Medication Sig Dispense Refill  . aspirin 325 MG tablet Take 325 mg by mouth daily.        . metoprolol (LOPRESSOR) 50 MG tablet Take 1 tablet (50 mg total) by mouth 2 (two) times daily.  60 tablet  5  . nitroGLYCERIN (NITROSTAT) 0.4 MG SL tablet Place 1 tablet (0.4 mg total) under the tongue every 5 (five) minutes as needed.  25 tablet  12  . prasugrel (EFFIENT) 10 MG TABS Take 1 tablet (10 mg total) by mouth daily.  90 each  4  . simvastatin (ZOCOR) 40 MG tablet TAKE 1 TABLET (40 MG TOTAL) BY MOUTH AT BEDTIME.  30 tablet  6  . DISCONTD: metoprolol (LOPRESSOR) 50 MG tablet TAKE 1 TABLET (50 MG TOTAL) BY MOUTH 2 (TWO) TIMES DAILY.  60 tablet  6    Past Medical History  Diagnosis Date  . Coronary artery disease     cardiac catheterization on January 26,2010, right coronary arter distal occlusion with stenting of this with xience drug-eluting stent.  Pt also had liminal irregularities  in the LAD and 40-50% mid circumflex  stenosis  . Hypertension   . Nephrolithiasis   . Myocardial infarction     Past Surgical History  Procedure Date  . Coronary angioplasty with stent placement     ROS: As stated in the HPI and negative for all other systems.  PHYSICAL EXAM BP 150/73  Pulse 70  Ht 5' 4"  (1.626 m)  Wt 209 lb 12.8 oz (95.165 kg)  BMI 36.01 kg/m2 GENERAL:  Well appearing HEENT:  Pupils equal  round and reactive, fundi not visualized, oral mucosa unremarkable NECK:  No jugular venous distention, waveform within normal limits, carotid upstroke brisk and symmetric, no bruits, no thyromegaly LYMPHATICS:  No cervical, inguinal adenopathy LUNGS:  Clear to auscultation bilaterally BACK:  No CVA tenderness CHEST:  Unremarkable HEART:  PMI not displaced or sustained,S1 and S2 within normal limits, no S3, no S4, no clicks, no rubs, no murmurs ABD:  Flat, positive bowel sounds normal in frequency in pitch, no bruits, no rebound, no guarding, no midline pulsatile mass, no hepatomegaly, no splenomegaly EXT:  2 plus pulses throughout, no edema, no cyanosis no clubbing   EKG:  sinus rhythm, rate 70, axis within normal limits, intervals within normal limits, RSR prime V1 and, old inferior infarct 09/02/2011   ASSESSMENT AND PLAN

## 2011-09-02 NOTE — Assessment & Plan Note (Signed)
The patient understands the need to lose weight with diet and exercise. We have discussed specific strategies for this.  

## 2011-09-16 ENCOUNTER — Other Ambulatory Visit (INDEPENDENT_AMBULATORY_CARE_PROVIDER_SITE_OTHER): Payer: Managed Care, Other (non HMO)

## 2011-09-16 DIAGNOSIS — E7800 Pure hypercholesterolemia, unspecified: Secondary | ICD-10-CM

## 2011-09-16 DIAGNOSIS — I2510 Atherosclerotic heart disease of native coronary artery without angina pectoris: Secondary | ICD-10-CM

## 2011-09-16 LAB — LIPID PANEL
Cholesterol: 103 mg/dL (ref 0–200)
HDL: 33 mg/dL — ABNORMAL LOW (ref >39.00)
LDL Cholesterol: 52 mg/dL (ref 0–99)
Total CHOL/HDL Ratio: 3
Triglycerides: 89 mg/dL (ref 0.0–149.0)
VLDL: 17.8 mg/dL (ref 0.0–40.0)

## 2011-09-16 LAB — BASIC METABOLIC PANEL
BUN: 21 mg/dL (ref 6–23)
CO2: 26 mEq/L (ref 19–32)
Calcium: 8.8 mg/dL (ref 8.4–10.5)
Chloride: 107 mEq/L (ref 96–112)
Creatinine, Ser: 1.1 mg/dL (ref 0.4–1.5)
GFR: 71.94 mL/min (ref >60.00)
Glucose, Bld: 105 mg/dL — ABNORMAL HIGH (ref 70–99)
Potassium: 4.2 mEq/L (ref 3.5–5.1)
Sodium: 139 mEq/L (ref 135–145)

## 2011-09-20 ENCOUNTER — Encounter: Payer: Self-pay | Admitting: *Deleted

## 2011-10-07 ENCOUNTER — Ambulatory Visit (INDEPENDENT_AMBULATORY_CARE_PROVIDER_SITE_OTHER): Payer: Managed Care, Other (non HMO) | Admitting: Cardiology

## 2011-10-07 ENCOUNTER — Ambulatory Visit (INDEPENDENT_AMBULATORY_CARE_PROVIDER_SITE_OTHER)
Admission: RE | Admit: 2011-10-07 | Discharge: 2011-10-07 | Disposition: A | Discharge status: Home / Self Care | Payer: Managed Care, Other (non HMO) | Source: Ambulatory Visit | Attending: Cardiology | Admitting: Cardiology

## 2011-10-07 ENCOUNTER — Encounter: Payer: Self-pay | Admitting: Cardiology

## 2011-10-07 VITALS — BP 135/70 | HR 72 | Ht 64.0 in | Wt 202.8 lb

## 2011-10-07 DIAGNOSIS — R05 Cough: Secondary | ICD-10-CM | POA: Insufficient documentation

## 2011-10-07 DIAGNOSIS — R059 Cough, unspecified: Secondary | ICD-10-CM | POA: Insufficient documentation

## 2011-10-07 DIAGNOSIS — Z720 Tobacco use: Secondary | ICD-10-CM

## 2011-10-07 DIAGNOSIS — I2510 Atherosclerotic heart disease of native coronary artery without angina pectoris: Secondary | ICD-10-CM

## 2011-10-07 DIAGNOSIS — I10 Essential (primary) hypertension: Secondary | ICD-10-CM

## 2011-10-07 DIAGNOSIS — F17200 Nicotine dependence, unspecified, uncomplicated: Secondary | ICD-10-CM

## 2011-10-07 MED ORDER — CLARITHROMYCIN 500 MG PO TABS: 500.0000 mg | ORAL_TABLET | Freq: Two times a day (BID) | ORAL | Status: AC

## 2011-10-07 MED ORDER — LOSARTAN POTASSIUM 25 MG PO TABS: 25.0000 mg | ORAL_TABLET | Freq: Every day | ORAL | Status: DC

## 2011-10-07 NOTE — Assessment & Plan Note (Signed)
The patient has no new sypmtoms.  No further cardiovascular testing is indicated.  We will continue with aggressive risk reduction and meds as listed.  

## 2011-10-07 NOTE — Assessment & Plan Note (Signed)
He will get a chest Xray and we will change to the ARB as above.

## 2011-10-07 NOTE — Assessment & Plan Note (Signed)
His blood pressure is better controlled. However, since there is a chance that the coughing is related to the ACE inhibitor I'm going to stop this medication and start him on Cozaar 25 mg daily. He'll also be a workup of a cough as described.

## 2011-10-07 NOTE — Addendum Note (Signed)
Addended by: Lucile Crater D on: 10/07/2011 05:18 PM   Modules accepted: Orders

## 2011-10-07 NOTE — Assessment & Plan Note (Signed)
He plans to quit smoking as above.

## 2011-10-07 NOTE — Patient Instructions (Addendum)
A chest x-ray takes a picture of the organs and structures inside the chest, including the heart, lungs, and blood vessels. This test can show several things, including, whether the heart is enlarges; whether fluid is building up in the lungs; and whether pacemaker / defibrillator leads are still in place.  Go to the [facility] on [facility] for this  Your physician recommends that you schedule a follow-up appointment in: 3 months.   Your physician has recommended you make the following change in your medication: STOP your lisinopril and START Cozaar 75m daily

## 2011-10-07 NOTE — Progress Notes (Signed)
   HPI The patient presents for followup of HTN.  At the last visit his blood pressure was elevated. I added an ACE inhibitor. Since that time he's had a cough. He reports it is productive of brown sputum and sometimes tinged with blood. He's not had any fevers or chills. He has had some chest pain which has been intermittent. It moves around his chest sometimes on the right side and sometimes on the left. It's somewhat sharp. He's not getting any of the arm discomfort that was his previous angina. He's not describing any neck discomfort. He's been eating better and lost about 7 pounds. He's not describing PND or orthopnea. He's had no palpitations, presyncope or syncope. He is down to one half pack of cigarettes per day after he and his wife finish the last 2 packs they have they're going to quit.  Allergies  Allergen Reactions  . Clopidogrel Bisulfate     Current Outpatient Prescriptions  Medication Sig Dispense Refill  . aspirin 325 MG tablet Take 325 mg by mouth daily.        Marland Kitchen lisinopril (PRINIVIL,ZESTRIL) 10 MG tablet Take 1 tablet (10 mg total) by mouth daily.  30 tablet  11  . metoprolol (LOPRESSOR) 50 MG tablet Take 1 tablet (50 mg total) by mouth 2 (two) times daily.  60 tablet  5  . nitroGLYCERIN (NITROSTAT) 0.4 MG SL tablet Place 1 tablet (0.4 mg total) under the tongue every 5 (five) minutes as needed.  25 tablet  12  . simvastatin (ZOCOR) 40 MG tablet TAKE 1 TABLET (40 MG TOTAL) BY MOUTH AT BEDTIME.  30 tablet  6    Past Medical History  Diagnosis Date  . Coronary artery disease     cardiac catheterization on January 26,2010, right coronary arter distal occlusion with stenting of this with xience drug-eluting stent.  Pt also had liminal irregularities  in the LAD and 40-50% mid circumflex  stenosis  . Hypertension   . Nephrolithiasis   . Myocardial infarction     Past Surgical History  Procedure Date  . Coronary angioplasty with stent placement     ROS: As stated in the  HPI and negative for all other systems.  PHYSICAL EXAM BP 135/70  Pulse 72  Ht 5' 4"  (1.626 m)  Wt 202 lb 12.8 oz (91.989 kg)  BMI 34.81 kg/m2 GENERAL:  Well appearing HEENT:  Pupils equal round and reactive, fundi not visualized, oral mucosa unremarkable NECK:  No jugular venous distention, waveform within normal limits, carotid upstroke brisk and symmetric, no bruits, no thyromegaly LYMPHATICS:  No cervical, inguinal adenopathy LUNGS:  Clear to auscultation bilaterally BACK:  No CVA tenderness CHEST:  Unremarkable HEART:  PMI not displaced or sustained,S1 and S2 within normal limits, no S3, no S4, no clicks, no rubs, no murmurs ABD:  Flat, positive bowel sounds normal in frequency in pitch, no bruits, no rebound, no guarding, no midline pulsatile mass, no hepatomegaly, no splenomegaly EXT:  2 plus pulses throughout, no edema, no cyanosis no clubbing or  EKG:  Sinus rhythm, rate 72, old inferior infarct, posterior involvement with early transition in lead V2, RSR prime V2, no acute ST-T wave changes.  10/07/2011  ASSESSMENT AND PLAN

## 2011-10-10 ENCOUNTER — Telehealth: Payer: Self-pay | Admitting: Cardiology

## 2011-10-10 NOTE — Telephone Encounter (Signed)
PT CALLED IS GOING TO SEE [facility] IN [place]  LAST OFFICE NOTE AND CXR FAXED TO OFFICE FAX NUMBER IS [phone]

## 2011-10-10 NOTE — Telephone Encounter (Signed)
New msg Pt wants to talk to you getting info to primary care provider

## 2011-10-12 ENCOUNTER — Other Ambulatory Visit: Payer: Self-pay | Admitting: Cardiology

## 2011-10-12 NOTE — Telephone Encounter (Signed)
..   Requested Prescriptions   Pending Prescriptions Disp Refills  . metoprolol (LOPRESSOR) 50 MG tablet [Pharmacy Med Name: METOPROLOL TARTA 50MG TAB] 60 tablet 6    Sig: TAKE 1 TABLET (50 MG TOTAL) BY MOUTH 2 (TWO) TIMES DAILY.

## 2011-11-08 ENCOUNTER — Other Ambulatory Visit: Payer: Self-pay | Admitting: Cardiology

## 2011-11-08 NOTE — Telephone Encounter (Signed)
..   Requested Prescriptions   Pending Prescriptions Disp Refills  . simvastatin (ZOCOR) 40 MG tablet [Pharmacy Med Name: SIMVASTATIN 40 MG TABLET] 30 tablet 6    Sig: TAKE 1 TABLET (40 MG TOTAL) BY MOUTH AT BEDTIME.

## 2012-01-06 ENCOUNTER — Encounter: Payer: Self-pay | Admitting: Cardiology

## 2012-01-06 ENCOUNTER — Ambulatory Visit (INDEPENDENT_AMBULATORY_CARE_PROVIDER_SITE_OTHER): Payer: Managed Care, Other (non HMO) | Admitting: Cardiology

## 2012-01-06 VITALS — BP 150/90 | HR 61 | Ht 64.0 in | Wt 206.4 lb

## 2012-01-06 DIAGNOSIS — Z720 Tobacco use: Secondary | ICD-10-CM

## 2012-01-06 DIAGNOSIS — I10 Essential (primary) hypertension: Secondary | ICD-10-CM

## 2012-01-06 DIAGNOSIS — F17200 Nicotine dependence, unspecified, uncomplicated: Secondary | ICD-10-CM

## 2012-01-06 DIAGNOSIS — I2510 Atherosclerotic heart disease of native coronary artery without angina pectoris: Secondary | ICD-10-CM

## 2012-01-06 DIAGNOSIS — E663 Overweight: Secondary | ICD-10-CM

## 2012-01-06 NOTE — Progress Notes (Signed)
HPI The patient presents for followup of HTN and CAD.  At the last visit he had a cough and was found to have a pneumonia. He was treated with antibiotics and improved. His cough resolved. Since that time he's had no further symptoms. The patient denies any new symptoms such as chest discomfort, neck or arm discomfort. There has been no new shortness of breath, PND or orthopnea. There have been no reported palpitations, presyncope or syncope. He is still smoking and not exercising. He  Allergies  Allergen Reactions  . Clopidogrel Bisulfate     Current Outpatient Prescriptions  Medication Sig Dispense Refill  . aspirin 325 MG tablet Take 325 mg by mouth daily.        Marland Kitchen lisinopril (PRINIVIL,ZESTRIL) 10 MG tablet Take 10 mg by mouth daily.      . metoprolol (LOPRESSOR) 50 MG tablet Take 1 tablet (50 mg total) by mouth 2 (two) times daily.  60 tablet  5  . nitroGLYCERIN (NITROSTAT) 0.4 MG SL tablet Place 1 tablet (0.4 mg total) under the tongue every 5 (five) minutes as needed.  25 tablet  12  . simvastatin (ZOCOR) 40 MG tablet TAKE 1 TABLET (40 MG TOTAL) BY MOUTH AT BEDTIME.  30 tablet  6  . DISCONTD: metoprolol (LOPRESSOR) 50 MG tablet TAKE 1 TABLET (50 MG TOTAL) BY MOUTH 2 (TWO) TIMES DAILY.  60 tablet  6    Past Medical History  Diagnosis Date  . Coronary artery disease     cardiac catheterization on January 26,2010, right coronary arter distal occlusion with stenting of this with xience drug-eluting stent.  Pt also had liminal irregularities  in the LAD and 40-50% mid circumflex  stenosis  . Hypertension   . Nephrolithiasis   . Myocardial infarction     Past Surgical History  Procedure Date  . Coronary angioplasty with stent placement     ROS: As stated in the HPI and negative for all other systems.  PHYSICAL EXAM BP 150/90  Pulse 61  Ht 5' 4"  (1.626 m)  Wt 93.622 kg (206 lb 6.4 oz)  BMI 35.43 kg/m2 GENERAL:  Well appearing HEENT:  Pupils equal round and reactive,  fundi not visualized, oral mucosa unremarkable NECK:  No jugular venous distention, waveform within normal limits, carotid upstroke brisk and symmetric, no bruits, no thyromegaly LYMPHATICS:  No cervical, inguinal adenopathy LUNGS:  Clear to auscultation bilaterally BACK:  No CVA tenderness CHEST:  Unremarkable HEART:  PMI not displaced or sustained,S1 and S2 within normal limits, no S3, no S4, no clicks, no rubs, no murmurs ABD:  Flat, positive bowel sounds normal in frequency in pitch, no bruits, no rebound, no guarding, no midline pulsatile mass, no hepatomegaly, no splenomegaly EXT:  2 plus pulses throughout, no edema, no cyanosis no clubbing or  EKG:  Sinus rhythm, rate 61, intervals WNL no acute ST-T wave changes.  01/06/2012  ASSESSMENT AND PLAN  HYPERTENSION, UNSPECIFIED -  His blood pressure is elevated here today but he says this is very unusual. He said he is usually well controlled. Therefore, no change in therapy is indicated.  CAD, UNSPECIFIED SITE -  The patient has no new sypmtoms. No further cardiovascular testing is indicated. Unfortunately he is not participating risk reduction. I tried again to quit smoking. I tried to get an exercise. I have told him that he is at high risk for future cardiovascular events. . Tobacco abuse -  He has no plans to quit smoking though  we have talked about this many many times. He does not want a prescription.

## 2012-01-06 NOTE — Patient Instructions (Addendum)
The current medical regimen is effective;  continue present plan and medications.  Follow up in 1 year with Dr Percival Spanish.  You will receive a letter in the mail 2 months before you are due.  Please call us when you receive this letter to schedule your follow up appointment.  Smoking Cessation Quitting smoking is important to your health and has many advantages. However, it is not always easy to quit since nicotine is a very addictive drug. Often times, people try 3 times or more before being able to quit. This document explains the best ways for you to prepare to quit smoking. Quitting takes hard work and a lot of effort, but you can do it. ADVANTAGES OF QUITTING SMOKING  You will live longer, feel better, and live better.  Your body will feel the impact of quitting smoking almost immediately.  Within 20 minutes, blood pressure decreases. Your pulse returns to its normal level.  After 8 hours, carbon monoxide levels in the blood return to normal. Your oxygen level increases.  After 24 hours, the chance of having a heart attack starts to decrease. Your breath, hair, and body stop smelling like smoke.  After 48 hours, damaged nerve endings begin to recover. Your sense of taste and smell improve.  After 72 hours, the body is virtually free of nicotine. Your bronchial tubes relax and breathing becomes easier.  After 2 to 12 weeks, lungs can hold more air. Exercise becomes easier and circulation improves.  The risk of having a heart attack, stroke, cancer, or lung disease is greatly reduced.  After 1 year, the risk of coronary heart disease is cut in half.  After 5 years, the risk of stroke falls to the same as a nonsmoker.  After 10 years, the risk of lung cancer is cut in half and the risk of other cancers decreases significantly.  After 15 years, the risk of coronary heart disease drops, usually to the level of a nonsmoker.  If you are pregnant, quitting smoking will improve your  chances of having a healthy baby.  The people you live with, especially any children, will be healthier.  You will have extra money to spend on things other than cigarettes. QUESTIONS TO THINK ABOUT BEFORE ATTEMPTING TO QUIT You may want to talk about your answers with your caregiver.  Why do you want to quit?  If you tried to quit in the past, what helped and what did not?  What will be the most difficult situations for you after you quit? How will you plan to handle them?  Who can help you through the tough times? Your family? Friends? A caregiver?  What pleasures do you get from smoking? What ways can you still get pleasure if you quit? Here are some questions to ask your caregiver:  How can you help me to be successful at quitting?  What medicine do you think would be best for me and how should I take it?  What should I do if I need more help?  What is smoking withdrawal like? How can I get information on withdrawal? GET READY  Set a quit date.  Change your environment by getting rid of all cigarettes, ashtrays, matches, and lighters in your home, car, or work. Do not let people smoke in your home.  Review your past attempts to quit. Think about what worked and what did not. GET SUPPORT AND ENCOURAGEMENT You have a better chance of being successful if you have help. You can get  support in many ways.  Tell your family, friends, and co-workers that you are going to quit and need their support. Ask them not to smoke around you.  Get individual, group, or telephone counseling and support. Programs are available at [employer] and health centers. Call your local health department for information about programs in your area.  Spiritual beliefs and practices may help some smokers quit.  Download a "quit meter" on your computer to keep track of quit statistics, such as how long you have gone without smoking, cigarettes not smoked, and money saved.  Get a self-help book  about quitting smoking and staying off of tobacco. [place] yourself from urges to smoke. Talk to someone, go for a walk, or occupy your time with a task.  Change your normal routine. Take a different route to work. Drink tea instead of coffee. Eat breakfast in a different place.  Reduce your stress. Take a hot bath, exercise, or read a book.  Plan something enjoyable to do every day. Reward yourself for not smoking.  Explore interactive web-based programs that specialize in helping you quit. GET MEDICINE AND USE IT CORRECTLY Medicines can help you stop smoking and decrease the urge to smoke. Combining medicine with the above behavioral methods and support can greatly increase your chances of successfully quitting smoking.  Nicotine replacement therapy helps deliver nicotine to your body without the negative effects and risks of smoking. Nicotine replacement therapy includes nicotine gum, lozenges, inhalers, nasal sprays, and skin patches. Some may be available over-the-counter and others require a prescription.  Antidepressant medicine helps people abstain from smoking, but how this works is unknown. This medicine is available by prescription.  Nicotinic receptor partial agonist medicine simulates the effect of nicotine in your brain. This medicine is available by prescription. Ask your caregiver for advice about which medicines to use and how to use them based on your health history. Your caregiver will tell you what side effects to look out for if you choose to be on a medicine or therapy. Carefully read the information on the package. Do not use any other product containing nicotine while using a nicotine replacement product.  RELAPSE OR DIFFICULT SITUATIONS Most relapses occur within the first 3 months after quitting. Do not be discouraged if you start smoking again. Remember, most people try several times before finally quitting. You may have symptoms of  withdrawal because your body is used to nicotine. You may crave cigarettes, be irritable, feel very hungry, cough often, get headaches, or have difficulty concentrating. The withdrawal symptoms are only temporary. They are strongest when you first quit, but they will go away within 10 14 days. To reduce the chances of relapse, try to:  Avoid drinking alcohol. Drinking lowers your chances of successfully quitting.  Reduce the amount of caffeine you consume. Once you quit smoking, the amount of caffeine in your body increases and can give you symptoms, such as a rapid heartbeat, sweating, and anxiety.  Avoid smokers because they can make you want to smoke.  Do not let weight gain distract you. Many smokers will gain weight when they quit, usually less than 10 pounds. Eat a healthy diet and stay active. You can always lose the weight gained after you quit.  Find ways to improve your mood other than smoking. FOR MORE INFORMATION  www.smokefree.gov  Document Released: 03/15/2001 Document Revised: 09/20/2011 Document Reviewed: 06/30/2011 [facility] Patient Information 2013 [place].

## 2012-04-26 ENCOUNTER — Other Ambulatory Visit: Payer: Self-pay | Admitting: Cardiology

## 2012-05-22 ENCOUNTER — Other Ambulatory Visit: Payer: Self-pay | Admitting: Cardiology

## 2012-05-22 NOTE — Telephone Encounter (Signed)
...   Requested Prescriptions   Pending Prescriptions Disp Refills  . simvastatin (ZOCOR) 40 MG tablet [Pharmacy Med Name: SIMVASTATIN 40 MG TABLET] 30 tablet 6    Sig: TAKE 1 TABLET (40 MG TOTAL) BY MOUTH AT BEDTIME.

## 2012-05-31 ENCOUNTER — Other Ambulatory Visit: Payer: Self-pay | Admitting: Cardiology

## 2012-07-06 ENCOUNTER — Other Ambulatory Visit: Payer: Self-pay | Admitting: Family Medicine

## 2012-07-06 DIAGNOSIS — R109 Unspecified abdominal pain: Secondary | ICD-10-CM

## 2012-07-10 ENCOUNTER — Inpatient Hospital Stay: Admission: RE | Admit: 2012-07-10 | Payer: Managed Care, Other (non HMO) | Source: Ambulatory Visit

## 2012-07-13 ENCOUNTER — Ambulatory Visit
Admission: RE | Admit: 2012-07-13 | Discharge: 2012-07-13 | Disposition: A | Discharge status: Home / Self Care | Payer: Managed Care, Other (non HMO) | Source: Ambulatory Visit | Attending: Family Medicine | Admitting: Family Medicine

## 2012-07-13 DIAGNOSIS — R109 Unspecified abdominal pain: Secondary | ICD-10-CM

## 2012-08-02 ENCOUNTER — Encounter: Payer: Self-pay | Admitting: Gastroenterology

## 2012-08-19 ENCOUNTER — Other Ambulatory Visit: Payer: Self-pay | Admitting: Cardiology

## 2012-08-29 ENCOUNTER — Other Ambulatory Visit: Payer: Self-pay | Admitting: Cardiology

## 2012-09-07 ENCOUNTER — Ambulatory Visit (AMBULATORY_SURGERY_CENTER): Payer: Managed Care, Other (non HMO) | Admitting: *Deleted

## 2012-09-07 VITALS — Ht 64.0 in | Wt 216.0 lb

## 2012-09-07 DIAGNOSIS — Z860100 Personal history of colon polyps, unspecified: Secondary | ICD-10-CM

## 2012-09-07 DIAGNOSIS — Z1211 Encounter for screening for malignant neoplasm of colon: Secondary | ICD-10-CM

## 2012-09-07 MED ORDER — NA SULFATE-K SULFATE-MG SULF 17.5-3.13-1.6 GM/177ML PO SOLN: ORAL | Status: DC

## 2012-09-21 ENCOUNTER — Encounter: Payer: Managed Care, Other (non HMO) | Admitting: Gastroenterology

## 2012-10-12 ENCOUNTER — Ambulatory Visit (AMBULATORY_SURGERY_CENTER): Payer: Managed Care, Other (non HMO) | Admitting: Gastroenterology

## 2012-10-12 ENCOUNTER — Encounter: Payer: Self-pay | Admitting: Gastroenterology

## 2012-10-12 VITALS — BP 115/66 | HR 53 | Temp 97.0°F | Resp 16 | Ht 64.0 in | Wt 216.0 lb

## 2012-10-12 DIAGNOSIS — D126 Benign neoplasm of colon, unspecified: Secondary | ICD-10-CM

## 2012-10-12 DIAGNOSIS — Z86010 Personal history of colonic polyps: Secondary | ICD-10-CM

## 2012-10-12 DIAGNOSIS — Z800 Family history of malignant neoplasm of digestive organs: Secondary | ICD-10-CM

## 2012-10-12 MED ORDER — SODIUM CHLORIDE 0.9 % IV SOLN: 500.0000 mL | INTRAVENOUS | Status: DC

## 2012-10-12 NOTE — Patient Instructions (Signed)

## 2012-10-12 NOTE — Progress Notes (Signed)
Procedure ends, to recovery, report given and VSS. 

## 2012-10-12 NOTE — Op Note (Signed)
[facility]  [address] [place], [id number]   COLONOSCOPY PROCEDURE REPORT  PATIENT: [name], [name]  MR#: [id number] BIRTHDATE: 1951/03/28 , 61  yrs. old GENDER: Male ENDOSCOPIST: Inda Castle, MD REFERRED GQ:[id number] Maceo Pro, M.D. PROCEDURE DATE:  10/12/2012 PROCEDURE:   Colonoscopy with snare polypectomy and Colonoscopy with cold biopsy polypectomy ASA CLASS:   Class II INDICATIONS:Patient's personal history of colon polyps.   colonic polyposis 2010 MEDICATIONS: MAC sedation, administered by CRNA and propofol (Diprivan) 253m IV  DESCRIPTION OF PROCEDURE:   After the risks benefits and alternatives of the procedure were thoroughly explained, informed consent was obtained.  A digital rectal exam revealed no abnormalities of the rectum.   The LB [id number] endoscope was introduced through the anus and advanced to the cecum, which was identified by both the appendix and ileocecal valve. No adverse events experienced.   The quality of the prep was excellent using Suprep  The instrument was then slowly withdrawn as the colon was fully examined.      COLON FINDINGS: A sessile polyp measuring 2 mm in size was found at the cecum.  A polypectomy was performed with cold forceps.   Two sessile polyps ranging between 3-548min size were found in the ascending colon.  A polypectomy was performed with a cold snare. The resection was complete and the polyp tissue was completely retrieved.   A sessile polyp measuring 3 mm in size was found in the descending colon.  A polypectomy was performed.  The resection was complete and the polyp tissue was completely retrieved.   The colon mucosa was otherwise normal.  Retroflexed views revealed no abnormalities. The time to cecum=2 minutes 19 seconds.  Withdrawal time=12 minutes 24 seconds.  The scope was withdrawn and the procedure completed. COMPLICATIONS: There were no complications.  ENDOSCOPIC IMPRESSION: 1.    Sessile polyp measuring 2 mm in size was found at the cecum; polypectomy was performed with cold forceps 2.   Two sessile polyps ranging between 3-23m20mn size were found in the ascending colon; polypectomy was performed with a cold snare 3.   Sessile polyp measuring 3 mm in size was found in the descending colon; polypectomy was performed 4.   The colon mucosa was otherwise normal  RECOMMENDATIONS: If the polyp(s) removed today are proven to be adenomatous (pre-cancerous) polyps, you will need a colonoscopy in 3 years. Otherwise you should continue to follow colorectal cancer screening guidelines for "routine risk" patients with a colonoscopy in 10 years.  You will receive a letter within 1-2 weeks with the results of your biopsy as well as final recommendations.  Please call my office if you have not received a letter after 3 weeks.   eSigned:  RobInda CastleD 10/12/2012 9:04 AM   cc:   PATIENT NAME:  [name] [name], [name] [name]#: [id number]

## 2012-10-12 NOTE — Progress Notes (Signed)
Patient did not experience any of the following events: a burn prior to discharge; a fall within the facility; wrong site/side/patient/procedure/implant event; or a hospital transfer or hospital admission upon discharge from the facility. (G8907) Patient did not have preoperative order for IV antibiotic SSI prophylaxis. (G8918)  

## 2012-10-12 NOTE — Progress Notes (Signed)
Called to room to assist during endoscopic procedure.  Patient ID and intended procedure confirmed with present staff. Received instructions for my participation in the procedure from the performing physician.  

## 2012-10-15 ENCOUNTER — Telehealth: Payer: Self-pay | Admitting: *Deleted

## 2012-10-15 NOTE — Telephone Encounter (Signed)
  Follow up Call-  Call back number 10/12/2012  Post procedure Call Back phone  # [phone]  Permission to leave phone message Yes    [facility]

## 2012-10-19 ENCOUNTER — Encounter: Payer: Self-pay | Admitting: Gastroenterology

## 2012-11-12 ENCOUNTER — Other Ambulatory Visit: Payer: Self-pay | Admitting: Cardiology

## 2013-01-02 ENCOUNTER — Other Ambulatory Visit: Payer: Self-pay | Admitting: Cardiology

## 2013-01-18 ENCOUNTER — Encounter: Payer: Self-pay | Admitting: Cardiology

## 2013-01-18 ENCOUNTER — Ambulatory Visit (INDEPENDENT_AMBULATORY_CARE_PROVIDER_SITE_OTHER): Payer: Managed Care, Other (non HMO) | Admitting: Cardiology

## 2013-01-18 VITALS — BP 128/78 | HR 68 | Ht 63.5 in | Wt 219.0 lb

## 2013-01-18 DIAGNOSIS — I2510 Atherosclerotic heart disease of native coronary artery without angina pectoris: Secondary | ICD-10-CM

## 2013-01-18 DIAGNOSIS — I10 Essential (primary) hypertension: Secondary | ICD-10-CM

## 2013-01-18 NOTE — Patient Instructions (Signed)
The current medical regimen is effective;  continue present plan and medications.  Follow up in 1 year with Dr Hochrein.  You will receive a letter in the mail 2 months before you are due.  Please call us when you receive this letter to schedule your follow up appointment.  

## 2013-01-18 NOTE — Progress Notes (Signed)
HPI The patient presents for followup of HTN and CAD.   Unfortunately since I saw him he has hurt his back. He denies any cardiovascular symptoms however. He has had no chest pressure, neck or arm discomfort. He denies any palpitations, presyncope or syncope. He has had no PND or orthopnea. He did hurt his back and is seeing an orthopedist. He is going to start steroids and physical therapy.  Allergies  Allergen Reactions  . Clopidogrel Bisulfate Hives    Plavix="hives"    Current Outpatient Prescriptions  Medication Sig Dispense Refill  . aspirin 325 MG tablet Take 325 mg by mouth daily.        Marland Kitchen lisinopril (PRINIVIL,ZESTRIL) 10 MG tablet TAKE 1 TABLET BY MOUTH EVERY DAY  30 tablet  11  . metoprolol (LOPRESSOR) 50 MG tablet Take 1 tablet (50 mg total) by mouth 2 (two) times daily.  60 tablet  5  . nitroGLYCERIN (NITROSTAT) 0.4 MG SL tablet Place 1 tablet (0.4 mg total) under the tongue every 5 (five) minutes as needed.  25 tablet  12  . pantoprazole (PROTONIX) 40 MG tablet Take 40 mg by mouth every other day.      . simvastatin (ZOCOR) 40 MG tablet TAKE 1 TABLET (40 MG TOTAL) BY MOUTH AT BEDTIME.  30 tablet  6   No current facility-administered medications for this visit.    Past Medical History  Diagnosis Date  . Coronary artery disease     cardiac catheterization on January 26,2010, right coronary arter distal occlusion with stenting of this with xience drug-eluting stent.  Pt also had liminal irregularities  in the LAD and 40-50% mid circumflex  stenosis  . Hypertension   . Nephrolithiasis   . Myocardial infarction 2010  . Hyperlipidemia   . Ulcer   . Arthritis     Past Surgical History  Procedure Laterality Date  . Coronary angioplasty with stent placement  2010  . Colonoscopy    . Polypectomy      ROS: Back pain.  Otherwise as stated in the HPI and negative for all other systems.  PHYSICAL EXAM BP 128/78  Pulse 68  Ht 5' 3.5" (1.613 m)  Wt 219 lb (99.338 kg)   BMI 38.18 kg/m2 GENERAL:  Well appearing HEENT:  Pupils equal round and reactive, fundi not visualized, oral mucosa unremarkable NECK:  No jugular venous distention, waveform within normal limits, carotid upstroke brisk and symmetric, no bruits, no thyromegaly LUNGS:  Clear to auscultation bilaterally BACK:  No CVA tenderness CHEST:  Unremarkable HEART:  PMI not displaced or sustained,S1 and S2 within normal limits, no S3, no S4, no clicks, no rubs, no murmurs ABD:  Flat, positive bowel sounds normal in frequency in pitch, no bruits, no rebound, no guarding, no midline pulsatile mass, no hepatomegaly, no splenomegaly EXT:  2 plus pulses throughout, no edema, no cyanosis no clubbing or  EKG:  Sinus rhythm, rate 68, intervals WNL no acute ST-T wave changes.  01/18/2013  ASSESSMENT AND PLAN  HYPERTENSION, UNSPECIFIED -  The blood pressure is at target. No change in medications is indicated. We will continue with therapeutic lifestyle changes (TLC).  CAD, UNSPECIFIED SITE -  The patient has no new sypmtoms.  No further cardiovascular testing is indicated.  We will continue with aggressive risk reduction and meds as listed.  Tobacco abuse -  He  Is no longer smoking. He is using the electronic cigarettes. We discussed weaning down on these.  DYSLIPIDEMIA -  he  was given instructions to get a lipid profile and liver enzymes which she will ave done in his primary provider's offie and then he will call me.

## 2013-02-22 ENCOUNTER — Other Ambulatory Visit: Payer: Self-pay | Admitting: Cardiology

## 2013-04-23 ENCOUNTER — Other Ambulatory Visit: Payer: Self-pay | Admitting: Cardiology

## 2013-05-27 ENCOUNTER — Other Ambulatory Visit: Payer: Self-pay | Admitting: Cardiology

## 2013-08-21 ENCOUNTER — Other Ambulatory Visit: Payer: Self-pay | Admitting: Cardiology

## 2013-08-21 ENCOUNTER — Other Ambulatory Visit: Payer: Self-pay

## 2013-08-21 MED ORDER — LISINOPRIL 10 MG PO TABS: ORAL_TABLET | ORAL | Status: DC

## 2013-10-17 ENCOUNTER — Ambulatory Visit: Payer: Managed Care, Other (non HMO)

## 2013-10-24 ENCOUNTER — Ambulatory Visit: Payer: Managed Care, Other (non HMO)

## 2013-10-31 ENCOUNTER — Ambulatory Visit: Payer: Managed Care, Other (non HMO)

## 2014-01-07 ENCOUNTER — Encounter: Payer: Self-pay | Admitting: Gastroenterology

## 2015-09-07 ENCOUNTER — Encounter: Payer: Self-pay | Admitting: Gastroenterology

## 2015-11-11 ENCOUNTER — Encounter (INDEPENDENT_AMBULATORY_CARE_PROVIDER_SITE_OTHER): Payer: Self-pay

## 2015-11-11 ENCOUNTER — Encounter: Payer: Self-pay | Admitting: Cardiology

## 2015-11-11 ENCOUNTER — Ambulatory Visit (INDEPENDENT_AMBULATORY_CARE_PROVIDER_SITE_OTHER): Payer: Managed Care, Other (non HMO) | Admitting: Cardiology

## 2015-11-11 VITALS — BP 155/95 | HR 56 | Ht 64.0 in | Wt 203.8 lb

## 2015-11-11 DIAGNOSIS — I2510 Atherosclerotic heart disease of native coronary artery without angina pectoris: Secondary | ICD-10-CM | POA: Diagnosis not present

## 2015-11-11 DIAGNOSIS — R001 Bradycardia, unspecified: Secondary | ICD-10-CM | POA: Diagnosis not present

## 2015-11-11 NOTE — Patient Instructions (Signed)
Medication Instructions:  DECREASE Aspirin 81 mg daily  Labwork: NONE  Testing/Procedures: Your physician has requested that you have an exercise tolerance test. For further information please visit [email or web address]. Please also follow instruction sheet, as given.  Your physician has recommended that you wear a 48 hour holter monitor. Holter monitors are medical devices that record the heart's electrical activity. Doctors most often use these monitors to diagnose arrhythmias. Arrhythmias are problems with the speed or rhythm of the heartbeat. The monitor is a small, portable device. You can wear one while you do your normal daily activities. This is usually used to diagnose what is causing palpitations/syncope (passing out).  Follow-Up: Your physician recommends that you schedule a follow-up appointment in: 1 Month   Any Other Special Instructions Will Be Listed Below (If Applicable).   If you need a refill on your cardiac medications before your next appointment, please call your pharmacy.

## 2015-11-11 NOTE — Progress Notes (Signed)
Cardiology Office Note   Date:  11/11/2015   ID:  [name] [name], DOB [birth year], MRN [id number]  PCP:  Beatris Si  Cardiologist:   Minus Breeding, MD   Chief Complaint  Patient presents with  . Bradycardia      History of Present Illness: [name] [name] is a 65 y.o. male who presents for follow-up of her low heart rate as well as coronary disease. It's been almost 3 years since I saw him. He's been doing well from a cardiovascular standpoint. However, recently he's noted some dizziness. His heart rate has been recorded at times lower than 40. There is some ectopy noted on EKG but he was also taking beta blocker which may have been contributing to a lower heart rate. He's held this for the last few days. He's not had any syncope. He's not been having any chest pressure, neck or arm discomfort. He's had no weight gain or edema.  He unfortunately is still smoking.  He is doing some exercising and cannot get symptoms with this.   He has none of the arm pain that was his angina.   Past Medical History:  Diagnosis Date  . Arthritis   . Coronary artery disease    cardiac catheterization on January 26,2010, right coronary arter distal occlusion with stenting of this with xience drug-eluting stent.  Pt also had liminal irregularities  in the LAD and 40-50% mid circumflex  stenosis  . Hyperlipidemia   . Hypertension   . Myocardial infarction ([place]) 2010  . Nephrolithiasis   . Ulcer     Past Surgical History:  Procedure Laterality Date  . COLONOSCOPY    . CORONARY ANGIOPLASTY WITH STENT PLACEMENT  2010  . POLYPECTOMY       Current Outpatient Prescriptions  Medication Sig Dispense Refill  . aspirin 325 MG tablet Take 325 mg by mouth daily.      Marland Kitchen lisinopril (PRINIVIL,ZESTRIL) 10 MG tablet TAKE 1 TABLET BY MOUTH EVERY DAY 30 tablet 3  . metFORMIN (GLUCOPHAGE) 500 MG tablet Take 1 tablet by mouth 2 (two) times daily.    . metoprolol (LOPRESSOR) 50 MG tablet TAKE 1  TABLET (50 MG TOTAL) BY MOUTH 2 (TWO) TIMES DAILY. 60 tablet 3  . nitroGLYCERIN (NITROSTAT) 0.4 MG SL tablet Place 1 tablet (0.4 mg total) under the tongue every 5 (five) minutes as needed. 25 tablet 12  . pantoprazole (PROTONIX) 40 MG tablet Take 40 mg by mouth every other day.    . simvastatin (ZOCOR) 40 MG tablet TAKE 1 TABLET (40 MG TOTAL) BY MOUTH AT BEDTIME. 30 tablet 6   No current facility-administered medications for this visit.     Allergies:   Clopidogrel bisulfate    ROS:  Please see the history of present illness.   Otherwise, review of systems are positive for none.   All other systems are reviewed and negative.    PHYSICAL EXAM: VS:  BP (!) 155/95   Pulse (!) 56   Ht 5' 4"  (1.626 m)   Wt 203 lb 12.8 oz (92.4 kg)   BMI 34.98 kg/m  , BMI Body mass index is 34.98 kg/m. GENERAL:  Well appearing HEENT:  Pupils equal round and reactive, fundi not visualized, oral mucosa unremarkable, edentulous NECK:  No jugular venous distention, waveform within normal limits, carotid upstroke brisk and symmetric, no bruits, no thyromegaly LYMPHATICS:  No cervical, inguinal adenopathy LUNGS:  Clear to auscultation bilaterally BACK:  No CVA tenderness CHEST:  Unremarkable  HEART:  PMI not displaced or sustained,S1 and S2 within normal limits, no S3, no S4, no clicks, no rubs, no murmurs ABD:  Flat, positive bowel sounds normal in frequency in pitch, no bruits, no rebound, no guarding, no midline pulsatile mass, no hepatomegaly, no splenomegaly EXT:  2 plus pulses throughout, no edema, no cyanosis no clubbing SKIN:  No rashes no nodules NEURO:  Cranial nerves II through XII grossly intact, motor grossly intact throughout PSYCH:  Cognitively intact, oriented to person place and time    EKG:  EKG is not ordered today. The ekg ordered 11/10/15 demonstrates sinus rhythm, rate 72, premature ectopic complexes, old inferior infarct, no acute ST-T wave changes   Recent Labs: No results found  for requested labs within last 8760 hours.    Lipid Panel   Wt Readings from Last 3 Encounters:  11/11/15 203 lb 12.8 oz (92.4 kg)  01/18/13 219 lb (99.3 kg)  10/12/12 216 lb (98 kg)      Other studies Reviewed: Additional studies/ records that were reviewed today include: None. Review of the above records demonstrates:  Please see elsewhere in the note.     ASSESSMENT AND PLAN:   HYPERTENSION, UNSPECIFIED -  The blood pressure is at target.  Meds will be altered as below.  CAD, UNSPECIFIED SITE -  I will bring the patient back for a POET (Plain Old Exercise Test). This will allow me to screen for obstructive coronary disease, risk stratify and very importantly provide a prescription for exercise.  Tobacco abuse -  He does not want to quit smoking and we talked about the risk of this again.   DYSLIPIDEMIA - He says this is followed by his primary provider and that he is at target. I will defer to [place] - This could be ectopy under estimating his heart rate for could've been related to his beta blocker. He's going to continue to hold his beta blocker. I'll get a 48 hour Holter. Further management will be based on this.     Current medicines are reviewed at length with the patient today.  The patient does not have concerns regarding medicines.  The following changes have been made:  As above  Labs/ tests ordered today include:   Orders Placed This Encounter  Procedures  . Holter monitor - 48 hour  . EXERCISE TOLERANCE TEST     Disposition:   FU with me after the above testing.     Signed, Minus Breeding, MD  11/11/2015 5:01 PM    [facility]

## 2015-11-12 ENCOUNTER — Encounter: Payer: Self-pay | Admitting: Cardiology

## 2015-11-18 ENCOUNTER — Other Ambulatory Visit: Payer: Self-pay | Admitting: Cardiology

## 2015-11-18 DIAGNOSIS — R001 Bradycardia, unspecified: Secondary | ICD-10-CM

## 2015-11-19 ENCOUNTER — Ambulatory Visit (INDEPENDENT_AMBULATORY_CARE_PROVIDER_SITE_OTHER): Payer: Managed Care, Other (non HMO)

## 2015-11-19 DIAGNOSIS — R001 Bradycardia, unspecified: Secondary | ICD-10-CM | POA: Diagnosis not present

## 2015-11-27 ENCOUNTER — Ambulatory Visit (INDEPENDENT_AMBULATORY_CARE_PROVIDER_SITE_OTHER): Payer: Managed Care, Other (non HMO)

## 2015-11-27 DIAGNOSIS — I2510 Atherosclerotic heart disease of native coronary artery without angina pectoris: Secondary | ICD-10-CM

## 2015-11-29 LAB — EXERCISE TOLERANCE TEST
Estimated workload: 7 METS
Exercise duration (min): 6 min
Exercise duration (sec): 0 s
MPHR: 157 {beats}/min
Peak HR: 151 {beats}/min
Percent HR: 96 %
RPE: 15
Rest HR: 83 {beats}/min

## 2015-11-30 ENCOUNTER — Telehealth: Payer: Self-pay | Admitting: Cardiology

## 2015-11-30 NOTE — Telephone Encounter (Signed)
New message ° ° ° ° ° ° °Pt returning nurse call  °

## 2015-11-30 NOTE — Telephone Encounter (Signed)
Returned call to patient he stated he was returning call about treadmill results.GXT results given and monitor results given.Stated he wanted to let Dr.Hochrein know he has not taken any metoprolol in the past 2 weeks.Stated B/P and pulse has been normal.Advised I will send message to Dr.Hochrein for advice.

## 2015-11-30 NOTE — Telephone Encounter (Signed)
He can continue off of the beta blocker.

## 2015-11-30 NOTE — Telephone Encounter (Signed)
Returned call to patient.Dr.Hochrein advised ok to continue off beta blocker.Advised to keep appointment with him 12/17/15 at 4:30 pm.

## 2015-12-16 NOTE — Progress Notes (Signed)
Cardiology Office Note   Date:  12/17/2015   ID:  [name] [name], DOB 11-07-1950, MRN [id number]  PCP:  Beatris Si  Cardiologist:   Minus Breeding, MD   Chief Complaint  Patient presents with  . PVCs      History of Present Illness: [name] [name] is a 65 y.o. male who presents for follow-up of her low heart rate as well as coronary disease. After the last visit he had a POET (Plain Old Exercise Treadmill) which was negative for ischemia.  He has been off of his beta blocker.  He did wear a Holter demonstrated no significant bradycardia arrhythmias although he did have some ventricular ectopy and occasional bigeminy.  Since that time he's done well.  He thinks he had one episode where his heart rate was low and he feels a little lightheaded when this happens.  However, he feels much better than he has previously. He is denying any chest discomfort, neck or arm discomfort. She's not having any shortness of breath, PND or orthopnea. He's had no weight gain or edema.   Past Medical History:  Diagnosis Date  . Arthritis   . Coronary artery disease    cardiac catheterization on January 26,2010, right coronary arter distal occlusion with stenting of this with xience drug-eluting stent.  Pt also had liminal irregularities  in the LAD and 40-50% mid circumflex  stenosis  . Hyperlipidemia   . Hypertension   . Myocardial infarction ([place]) 2010  . Nephrolithiasis   . Ulcer     Past Surgical History:  Procedure Laterality Date  . COLONOSCOPY    . CORONARY ANGIOPLASTY WITH STENT PLACEMENT  2010  . POLYPECTOMY       Current Outpatient Prescriptions  Medication Sig Dispense Refill  . aspirin EC 81 MG tablet Take 81 mg by mouth daily.    . Cyanocobalamin (B-12) 100 MCG TABS Take 1 tablet by mouth daily.    Marland Kitchen lisinopril (PRINIVIL,ZESTRIL) 10 MG tablet TAKE 1 TABLET BY MOUTH EVERY DAY 30 tablet 3  . metFORMIN (GLUCOPHAGE) 500 MG tablet Take 1 tablet by mouth 2 (two)  times daily.    . nitroGLYCERIN (NITROSTAT) 0.4 MG SL tablet Place 1 tablet (0.4 mg total) under the tongue every 5 (five) minutes as needed. 25 tablet 12  . pantoprazole (PROTONIX) 40 MG tablet Take 40 mg by mouth every other day.    . simvastatin (ZOCOR) 40 MG tablet TAKE 1 TABLET (40 MG TOTAL) BY MOUTH AT BEDTIME. 30 tablet 6   No current facility-administered medications for this visit.     Allergies:   Clopidogrel bisulfate    ROS:  Please see the history of present illness.   Otherwise, review of systems are positive for none.   All other systems are reviewed and negative.    PHYSICAL EXAM: VS:  BP 134/66 (BP Location: Left Arm, Patient Position: Sitting, Cuff Size: Large)   Pulse 82   Ht 5' 4"  (1.626 m)   Wt 205 lb 2 oz (93 kg)   BMI 35.21 kg/m  , BMI Body mass index is 35.21 kg/m. GENERAL:  Well appearing HEENT:  Pupils equal round and reactive, fundi not visualized, oral mucosa unremarkable, edentulous NECK:  No jugular venous distention, waveform within normal limits, carotid upstroke brisk and symmetric, no bruits, no thyromegaly LYMPHATICS:  No cervical, inguinal adenopathy LUNGS:  Clear to auscultation bilaterally BACK:  No CVA tenderness CHEST:  Unremarkable HEART:  PMI not displaced or sustained,S1  and S2 within normal limits, no S3, no S4, no clicks, no rubs, no murmurs ABD:  Flat, positive bowel sounds normal in frequency in pitch, no bruits, no rebound, no guarding, no midline pulsatile mass, no hepatomegaly, no splenomegaly EXT:  2 plus pulses throughout, no edema, no cyanosis no clubbing SKIN:  No rashes no nodules NEURO:  Cranial nerves II through XII grossly intact, motor grossly intact throughout PSYCH:  Cognitively intact, oriented to person place and time    EKG:  EKG is ordered today. The ekg ordered 11/10/15 demonstrates sinus rhythm, rate 82, premature ectopic complexes, old inferior infarct, no acute ST-T wave changes   Recent Labs: No results  found for requested labs within last 8760 hours.    Lipid Panel   Wt Readings from Last 3 Encounters:  12/17/15 205 lb 2 oz (93 kg)  11/11/15 203 lb 12.8 oz (92.4 kg)  01/18/13 219 lb (99.3 kg)      Other studies Reviewed: Additional studies/ records that were reviewed today include: Holter and POET (Plain Old Exercise Treadmill). Review of the above records demonstrates:  Please see elsewhere in the note.     ASSESSMENT AND PLAN:   HYPERTENSION, UNSPECIFIED -  The blood pressure is at target. No change in medications is indicated. We will continue with therapeutic lifestyle changes (TLC).  CAD, UNSPECIFIED SITE -  He had no ischemia on his treadmill. He will continue with risk reduction.  Tobacco abuse -  He does not want to quit smoking and we talked about the risk of this again.   DYSLIPIDEMIA - He says this is followed by his primary provider and that he is at target. I will defer to [place] - He has been holding his beta blocker and his HR and BP have been normal.  He does not need to restart this med.      Current medicines are reviewed at length with the patient today.  The patient does not have concerns regarding medicines.  The following changes have been made:    Labs/ tests ordered today include:   Orders Placed This Encounter  Procedures  . EKG 12-Lead     Disposition:   FU with me in one year.      Signed, Minus Breeding, MD  12/17/2015 6:12 PM    [facility]

## 2015-12-17 ENCOUNTER — Encounter: Payer: Self-pay | Admitting: Cardiology

## 2015-12-17 ENCOUNTER — Ambulatory Visit (INDEPENDENT_AMBULATORY_CARE_PROVIDER_SITE_OTHER): Payer: Managed Care, Other (non HMO) | Admitting: Cardiology

## 2015-12-17 VITALS — BP 134/66 | HR 82 | Ht 64.0 in | Wt 205.1 lb

## 2015-12-17 DIAGNOSIS — I10 Essential (primary) hypertension: Secondary | ICD-10-CM

## 2015-12-17 DIAGNOSIS — I2510 Atherosclerotic heart disease of native coronary artery without angina pectoris: Secondary | ICD-10-CM

## 2015-12-17 DIAGNOSIS — I493 Ventricular premature depolarization: Secondary | ICD-10-CM | POA: Diagnosis not present

## 2015-12-17 NOTE — Patient Instructions (Signed)
NO CHANGE IN CURRENT MEDICATIONS  Your physician wants you to follow-up in: [place] will receive a reminder letter in the mail two months in advance. If you don't receive a letter, please call our office to schedule the follow-up appointment.    If you need a refill on your cardiac medications before your next appointment, please call your pharmacy.

## 2016-12-15 NOTE — Progress Notes (Signed)
Cardiology Office Note   Date:  12/18/2016   ID:  [name] [name], DOB [birth year], MRN [id number]  PCP:  Corine Shelter, PA-C  Cardiologist:   Minus Breeding, MD   Chief Complaint  Patient presents with  . Coronary Artery Disease      History of Present Illness: [name] [name] is a 66 y.o. male who presents for follow-up of CAD.  He had a negative POET (Plain Old Exercise Treadmill) last year.  The patient denies any new symptoms such as chest discomfort, neck or arm discomfort. There has been no new shortness of breath, PND or orthopnea. There have been no reported palpitations, presyncope or syncope.  He golfs, walks in his pool and has no cardiovascular complaints.  He was getting light headed last year and having palpitations but these are no longer a problem.    Past Medical History:  Diagnosis Date  . Arthritis   . Coronary artery disease    cardiac catheterization on January 26,2010, right coronary arter distal occlusion with stenting of this with xience drug-eluting stent.  Pt also had liminal irregularities  in the LAD and 40-50% mid circumflex  stenosis  . Hyperlipidemia   . Hypertension   . Myocardial infarction ([place]) 2010  . Nephrolithiasis   . Ulcer     Past Surgical History:  Procedure Laterality Date  . COLONOSCOPY    . CORONARY ANGIOPLASTY WITH STENT PLACEMENT  2010  . POLYPECTOMY       Current Outpatient Prescriptions  Medication Sig Dispense Refill  . aspirin EC 81 MG tablet Take 81 mg by mouth daily.    . cyanocobalamin 1000 MCG tablet Take 1,000 mcg by mouth daily.    Marland Kitchen lisinopril (PRINIVIL,ZESTRIL) 10 MG tablet TAKE 1 TABLET BY MOUTH EVERY DAY 30 tablet 3  . metFORMIN (GLUCOPHAGE) 500 MG tablet Take 1 tablet by mouth 2 (two) times daily.    . nitroGLYCERIN (NITROSTAT) 0.4 MG SL tablet Place 1 tablet (0.4 mg total) under the tongue every 5 (five) minutes as needed. 25 tablet 2  . pantoprazole (PROTONIX) 40 MG tablet Take 40 mg by mouth  every other day.    . simvastatin (ZOCOR) 40 MG tablet TAKE 1 TABLET (40 MG TOTAL) BY MOUTH AT BEDTIME. 30 tablet 6   No current facility-administered medications for this visit.     Allergies:   Clopidogrel bisulfate    ROS:  Please see the history of present illness.   Otherwise, review of systems are positive for none.   As stated in the HPI and negative for all other systems..   All other systems are reviewed and negative.    PHYSICAL EXAM: VS:  BP 126/74   Pulse 69   Ht 5' 4"  (1.626 m)   Wt 205 lb (93 kg)   BMI 35.19 kg/m  , BMI Body mass index is 35.19 kg/m.  GENERAL:  Well appearing NECK:  No jugular venous distention, waveform within normal limits, carotid upstroke brisk and symmetric, no bruits, no thyromegaly LUNGS:  Clear to auscultation bilaterally CHEST:  Unremarkable HEART:  PMI not displaced or sustained,S1 and S2 within normal limits, no S3, no S4, no clicks, no rubs, no murmurs ABD:  Flat, positive bowel sounds normal in frequency in pitch, no bruits, no rebound, no guarding, no midline pulsatile mass, no hepatomegaly, no splenomegaly EXT:  2 plus pulses throughout, no edema, no cyanosis no clubbing    EKG:  EKG is ordered today. The ekg ordered  demonstrates sinus rhythm, rate 69, possible old inferior infarct, no acute ST-T wave changes   Recent Labs: No results found for requested labs within last 8760 hours.    Lipid Panel   Wt Readings from Last 3 Encounters:  12/16/16 205 lb (93 kg)  12/17/15 205 lb 2 oz (93 kg)  11/11/15 203 lb 12.8 oz (92.4 kg)      Other studies Reviewed: Additional studies/ records that were reviewed today include: Labs Review of the above records demonstrates:       ASSESSMENT AND PLAN:   HYPERTENSION, UNSPECIFIED -  The blood pressure is at target. No change in medications is indicated. We will continue with therapeutic lifestyle changes (TLC).  CAD, UNSPECIFIED SITE -  The patient has no new sypmtoms.  No  further cardiovascular testing is indicated.  We will continue with aggressive risk reduction and meds as listed.   Tobacco abuse -  He refuses to quit smoking.    DYSLIPIDEMIA - I was able to get labs and the LDL was 46.  This is goal and no change in therapy is indicated.    BRADYCARDIA - This is no longer a problem.  He will remain off of beta blocker.      Current medicines are reviewed at length with the patient today.  The patient does not have concerns regarding medicines.  The following changes have been made:  None  Labs/ tests ordered today include: None  Orders Placed This Encounter  Procedures  . EKG 12-Lead     Disposition:   FU with me in one year.      Signed, Minus Breeding, MD  12/18/2016 1:58 PM    [facility]

## 2016-12-16 ENCOUNTER — Ambulatory Visit (INDEPENDENT_AMBULATORY_CARE_PROVIDER_SITE_OTHER): Payer: 59 | Admitting: Cardiology

## 2016-12-16 ENCOUNTER — Encounter: Payer: Self-pay | Admitting: Cardiology

## 2016-12-16 VITALS — BP 126/74 | HR 69 | Ht 64.0 in | Wt 205.0 lb

## 2016-12-16 DIAGNOSIS — I2510 Atherosclerotic heart disease of native coronary artery without angina pectoris: Secondary | ICD-10-CM

## 2016-12-16 DIAGNOSIS — E785 Hyperlipidemia, unspecified: Secondary | ICD-10-CM | POA: Diagnosis not present

## 2016-12-16 DIAGNOSIS — Z720 Tobacco use: Secondary | ICD-10-CM

## 2016-12-16 DIAGNOSIS — I10 Essential (primary) hypertension: Secondary | ICD-10-CM | POA: Diagnosis not present

## 2016-12-16 MED ORDER — NITROGLYCERIN 0.4 MG SL SUBL: 0.4000 mg | SUBLINGUAL_TABLET | SUBLINGUAL | 2 refills | Status: DC | PRN

## 2016-12-16 NOTE — Patient Instructions (Signed)
Medication Instructions:  Continue current medication  If you need a refill on your cardiac medications before your next appointment, please call your pharmacy.  Labwork: None Ordered  Testing/Procedures: None ordered  Follow-Up: Your physician wants you to follow-up in: 1 Year. You should receive a reminder letter in the mail two months in advance. If you do not receive a letter, please call our office [phone].     Thank you for choosing [facility]!!

## 2016-12-18 ENCOUNTER — Encounter: Payer: Self-pay | Admitting: Cardiology

## 2017-11-23 ENCOUNTER — Encounter: Payer: Self-pay | Admitting: Cardiology

## 2017-12-16 NOTE — Progress Notes (Signed)
Cardiology Office Note   Date:  12/18/2017   ID:  Larron Armor, DOB [birth year], MRN [id number]  PCP:  Corine Shelter, PA-C  Cardiologist:   Minus Breeding, MD   No chief complaint on file.     History of Present Illness: [name] [name] is a 67 y.o. male who presents for follow-up of CAD.  He had a negative POET (Plain Old Exercise Treadmill) in 2017.   Since I last saw him he has done well. The patient denies any new symptoms such as chest discomfort, neck or arm discomfort. There has been no new shortness of breath, PND or orthopnea. There have been no reported palpitations, presyncope or syncope.  He uses his pool in the summer and walks through stores.  He golfs once weekly.     Past Medical History:  Diagnosis Date  . Arthritis   . Coronary artery disease    cardiac catheterization on January 26,2010, right coronary arter distal occlusion with stenting of this with xience drug-eluting stent.  Pt also had liminal irregularities  in the LAD and 40-50% mid circumflex  stenosis  . Hyperlipidemia   . Hypertension   . Myocardial infarction ([place]) 2010  . Nephrolithiasis   . Ulcer     Past Surgical History:  Procedure Laterality Date  . COLONOSCOPY    . CORONARY ANGIOPLASTY WITH STENT PLACEMENT  2010  . POLYPECTOMY       Current Outpatient Medications  Medication Sig Dispense Refill  . aspirin EC 81 MG tablet Take 81 mg by mouth daily.    Marland Kitchen b complex vitamins tablet Take 1 tablet by mouth daily.    . cyanocobalamin 1000 MCG tablet Take 1,000 mcg by mouth daily.    Marland Kitchen lisinopril (PRINIVIL,ZESTRIL) 10 MG tablet TAKE 1 TABLET BY MOUTH EVERY DAY 30 tablet 3  . metFORMIN (GLUCOPHAGE) 500 MG tablet Take 1 tablet by mouth 2 (two) times daily.    . nitroGLYCERIN (NITROSTAT) 0.4 MG SL tablet Place 1 tablet (0.4 mg total) under the tongue every 5 (five) minutes as needed. 25 tablet 2  . pantoprazole (PROTONIX) 40 MG tablet Take 40 mg by mouth every other day.    .  simvastatin (ZOCOR) 40 MG tablet TAKE 1 TABLET (40 MG TOTAL) BY MOUTH AT BEDTIME. 30 tablet 6   No current facility-administered medications for this visit.     Allergies:   Clopidogrel bisulfate    ROS:  Please see the history of present illness.   Otherwise, review of systems are positive for none.   As stated in the HPI and negative for all other systems..   All other systems are reviewed and negative.    PHYSICAL EXAM: VS:  BP 132/72   Pulse 71   Ht 5' 3.5" (1.613 m)   Wt 203 lb (92.1 kg)   BMI 35.40 kg/m  , BMI Body mass index is 35.4 kg/m.  GENERAL:  Well appearing NECK:  No jugular venous distention, waveform within normal limits, carotid upstroke brisk and symmetric, no bruits, no thyromegaly LUNGS:  Clear to auscultation bilaterally CHEST:  Unremarkable HEART:  PMI not displaced or sustained,S1 and S2 within normal limits, no S3, no S4, no clicks, no rubs, no murmurs ABD:  Flat, positive bowel sounds normal in frequency in pitch, no bruits, no rebound, no guarding, no midline pulsatile mass, no hepatomegaly, no splenomegaly EXT:  2 plus pulses throughout, no edema, no cyanosis no clubbing   EKG:  EKG is  ordered today. The ekg ordered demonstrates sinus rhythm, rate 71, possible old inferior infarct, no acute ST-T wave changes   Recent Labs: No results found for requested labs within last 8760 hours.    Lipid Panel   Wt Readings from Last 3 Encounters:  12/18/17 203 lb (92.1 kg)  12/16/16 205 lb (93 kg)  12/17/15 205 lb 2 oz (93 kg)      Other studies Reviewed: Additional studies/ records that were reviewed today include: Labs Review of the above records demonstrates:       ASSESSMENT AND PLAN:   HYPERTENSION, UNSPECIFIED -  The blood pressure is at target. No change in medications is indicated. We will continue with therapeutic lifestyle changes (TLC).  CAD, UNSPECIFIED SITE -  The patient has no new sypmtoms.  No further cardiovascular testing is  indicated.  We will continue with aggressive risk reduction and meds as listed.  Tobacco abuse -  He will not quit smoking.   DYSLIPIDEMIA - LDL was 49.  HDL 30.  He will continue on the meds as listed.  BRADYCARDIA - He has had bradycardia and so is off of beta blocker.   DM - A1C 6.1 per his PCP.      Current medicines are reviewed at length with the patient today.  The patient does not have concerns regarding medicines.  The following changes have been made:  None  Labs/ tests ordered today include: None  No orders of the defined types were placed in this encounter.    Disposition:   FU with me in one year.      Signed, Minus Breeding, MD  12/18/2017 11:59 AM    [place]

## 2017-12-18 ENCOUNTER — Ambulatory Visit (INDEPENDENT_AMBULATORY_CARE_PROVIDER_SITE_OTHER): Payer: Medicare Other | Admitting: Cardiology

## 2017-12-18 ENCOUNTER — Encounter: Payer: Self-pay | Admitting: Cardiology

## 2017-12-18 VITALS — BP 132/72 | HR 71 | Ht 63.5 in | Wt 203.0 lb

## 2017-12-18 DIAGNOSIS — I2510 Atherosclerotic heart disease of native coronary artery without angina pectoris: Secondary | ICD-10-CM

## 2017-12-18 DIAGNOSIS — E785 Hyperlipidemia, unspecified: Secondary | ICD-10-CM | POA: Diagnosis not present

## 2017-12-18 DIAGNOSIS — I10 Essential (primary) hypertension: Secondary | ICD-10-CM | POA: Diagnosis not present

## 2017-12-18 DIAGNOSIS — Z720 Tobacco use: Secondary | ICD-10-CM | POA: Diagnosis not present

## 2017-12-18 NOTE — Patient Instructions (Signed)
Medication Instructions:  Your physician recommends that you continue on your current medications as directed. Please refer to the Current Medication list given to you today.   Labwork: None ordered  Testing/Procedures: None ordered  Follow-Up: Your physician wants you to follow-up in: 1 year with Dr.Hochrein You will receive a reminder letter in the mail two months in advance. If you don't receive a letter, please call our office to schedule the follow-up appointment.   Any Other Special Instructions Will Be Listed Below (If Applicable).     If you need a refill on your cardiac medications before your next appointment, please call your pharmacy.

## 2018-12-27 ENCOUNTER — Ambulatory Visit: Payer: Medicare Other | Admitting: Cardiology

## 2018-12-29 NOTE — Progress Notes (Signed)
Cardiology Office Note   Date:  12/31/2018   ID:  [name] [name], DOB [birth year], MRN [id number]  PCP:  Corine Shelter, PA-C  Cardiologist:   Minus Breeding, MD   No chief complaint on file.     History of Present Illness: [name] [name] is a 68 y.o. male who presents for follow-up of CAD.  He had a negative POET (Plain Old Exercise Treadmill) in 2017.   Since I last saw him he has done well.  He does not practice risk factor modification and really has not over the years and we talked at length about this.  It is clear his diet is not what I would like.  He smokes a pack of cigarettes per day.  He does golf.  The patient denies any new symptoms such as chest discomfort, neck or arm discomfort. There has been no new shortness of breath, PND or orthopnea. There have been no reported palpitations, presyncope or syncope.    Past Medical History:  Diagnosis Date  . Arthritis   . Coronary artery disease    cardiac catheterization on January 26,2010, right coronary arter distal occlusion with stenting of this with xience drug-eluting stent.  Pt also had liminal irregularities  in the LAD and 40-50% mid circumflex  stenosis  . Hyperlipidemia   . Hypertension   . Myocardial infarction ([place]) 2010  . Nephrolithiasis   . Ulcer     Past Surgical History:  Procedure Laterality Date  . COLONOSCOPY    . CORONARY ANGIOPLASTY WITH STENT PLACEMENT  2010  . POLYPECTOMY       Current Outpatient Medications  Medication Sig Dispense Refill  . aspirin EC 81 MG tablet Take 81 mg by mouth daily.    Marland Kitchen b complex vitamins tablet Take 1 tablet by mouth daily.    . cyanocobalamin 1000 MCG tablet Take 1,000 mcg by mouth daily.    Marland Kitchen lisinopril (PRINIVIL,ZESTRIL) 10 MG tablet TAKE 1 TABLET BY MOUTH EVERY DAY 30 tablet 3  . metFORMIN (GLUCOPHAGE) 500 MG tablet Take 1 tablet by mouth 2 (two) times daily.    . nitroGLYCERIN (NITROSTAT) 0.4 MG SL tablet Place 1 tablet (0.4 mg total) under the  tongue every 5 (five) minutes as needed. 25 tablet 2  . pantoprazole (PROTONIX) 40 MG tablet Take 40 mg by mouth every other day.    . simvastatin (ZOCOR) 40 MG tablet TAKE 1 TABLET (40 MG TOTAL) BY MOUTH AT BEDTIME. 30 tablet 6   No current facility-administered medications for this visit.     Allergies:   Clopidogrel bisulfate    ROS:  Please see the history of present illness.   Otherwise, review of systems are positive for none.   As stated in the HPI and negative for all other systems..   All other systems are reviewed and negative.    PHYSICAL EXAM: VS:  BP (!) 146/68   Pulse 65   Ht 5' 4"  (1.626 m)   Wt 197 lb (89.4 kg)   BMI 33.81 kg/m  , BMI Body mass index is 33.81 kg/m.  GENERAL:  Well appearing NECK:  No jugular venous distention, waveform within normal limits, carotid upstroke brisk and symmetric, no bruits, no thyromegaly LUNGS:  Clear to auscultation bilaterally CHEST:  Unremarkable HEART:  PMI not displaced or sustained,S1 and S2 within normal limits, no S3, no S4, no clicks, no rubs, no murmurs ABD:  Flat, positive bowel sounds normal in frequency in pitch, no bruits,  no rebound, no guarding, no midline pulsatile mass, no hepatomegaly, no splenomegaly EXT:  2 plus pulses throughout, no edema, no cyanosis no clubbing     EKG:  EKG is  ordered today. The ekg ordered demonstrates sinus rhythm, rate 65, possible old inferior infarct, no acute ST-T wave changes   Recent Labs: No results found for requested labs within last 8760 hours.    Lipid Panel   Wt Readings from Last 3 Encounters:  12/31/18 197 lb (89.4 kg)  12/18/17 203 lb (92.1 kg)  12/16/16 205 lb (93 kg)      Other studies Reviewed: Additional studies/ records that were reviewed today include: None Review of the above records demonstrates:       ASSESSMENT AND PLAN:   HYPERTENSION, UNSPECIFIED -  The blood pressure is slightly elevated but he tells me this is very unusual is typically  in the 120/80 range at home.  No change in therapy.   CAD, UNSPECIFIED SITE -  He has no new symptoms.  No further cardiovascular testing is suggested.  He understands that he is high risk for future cardiovascular events given his unchanged lifestyle with poor diet and continued cigarette.   Tobacco abuse -  He is unable to quit smoking and we talked about this.  He does not want over therapy.   DYSLIPIDEMIA - LDL was  49.  I will be checking a lipid profile.   DM - A1C was 6.1 I will check an A1c.     Current medicines are reviewed at length with the patient today.  The patient does not have concerns regarding medicines.  The following changes have been made:  None  Labs/ tests ordered today include:   Orders Placed This Encounter  Procedures  . CBC  . Hemoglobin A1c  . Comprehensive metabolic panel  . Lipid panel  . EKG 12-Lead     Disposition:   FU with me in one year.      Signed, Minus Breeding, MD  12/31/2018 12:00 PM    [place]

## 2018-12-31 ENCOUNTER — Other Ambulatory Visit: Payer: Self-pay

## 2018-12-31 ENCOUNTER — Ambulatory Visit (INDEPENDENT_AMBULATORY_CARE_PROVIDER_SITE_OTHER): Payer: Medicare Other | Admitting: Cardiology

## 2018-12-31 ENCOUNTER — Encounter: Payer: Self-pay | Admitting: Cardiology

## 2018-12-31 VITALS — BP 146/68 | HR 65 | Ht 64.0 in | Wt 197.0 lb

## 2018-12-31 DIAGNOSIS — I2510 Atherosclerotic heart disease of native coronary artery without angina pectoris: Secondary | ICD-10-CM

## 2018-12-31 DIAGNOSIS — E785 Hyperlipidemia, unspecified: Secondary | ICD-10-CM

## 2018-12-31 DIAGNOSIS — Z720 Tobacco use: Secondary | ICD-10-CM

## 2018-12-31 DIAGNOSIS — R001 Bradycardia, unspecified: Secondary | ICD-10-CM

## 2018-12-31 DIAGNOSIS — E118 Type 2 diabetes mellitus with unspecified complications: Secondary | ICD-10-CM

## 2018-12-31 NOTE — Patient Instructions (Signed)
Medication Instructions:  Your physician recommends that you continue on your current medications as directed. Please refer to the Current Medication list given to you today.  If you need a refill on your cardiac medications before your next appointment, please call your pharmacy.   Lab work: Cholesterol, Hemoglobin A1C, CMET, CBC - Fasting If you have labs (blood work) drawn today and your tests are completely normal, you will receive your results only by: [place] (if you have MyChart) OR A paper copy in the mail If you have any lab test that is abnormal or we need to change your treatment, we will call you to review the results.  Testing/Procedures: NONE  Follow-Up: At [facility], you and your health needs are our priority.  As part of our continuing mission to provide you with exceptional heart care, we have created designated Provider Care Teams.  These Care Teams include your primary Cardiologist (physician) and Advanced Practice Providers (APPs -  Physician Assistants and Nurse Practitioners) who all work together to provide you with the care you need, when you need it. You will need a follow up appointment in 12 months.  Please call our office 2 months in advance to schedule this appointment.  You may see Dr. Percival Spanish or one of the following Advanced Practice Providers on your designated Care Team:   Rosaria Ferries, PA-C Jory Sims, DNP, ANP

## 2019-01-03 LAB — COMPREHENSIVE METABOLIC PANEL
ALT: 21 IU/L (ref 0–44)
AST: 16 IU/L (ref 0–40)
Albumin/Globulin Ratio: 1.7 (ref 1.2–2.2)
Albumin: 4.4 g/dL (ref 3.8–4.8)
Alkaline Phosphatase: 66 IU/L (ref 39–117)
BUN/Creatinine Ratio: 14 (ref 10–24)
BUN: 14 mg/dL (ref 8–27)
Bilirubin Total: 0.5 mg/dL (ref 0.0–1.2)
CO2: 25 mmol/L (ref 20–29)
Calcium: 9.5 mg/dL (ref 8.6–10.2)
Chloride: 104 mmol/L (ref 96–106)
Creatinine, Ser: 0.97 mg/dL (ref 0.76–1.27)
GFR calc Af Amer: 94 mL/min/{1.73_m2} (ref >59)
GFR calc non Af Amer: 81 mL/min/{1.73_m2} (ref >59)
Globulin, Total: 2.6 g/dL (ref 1.5–4.5)
Glucose: 99 mg/dL (ref 65–99)
Potassium: 4.4 mmol/L (ref 3.5–5.2)
Sodium: 141 mmol/L (ref 134–144)
Total Protein: 7 g/dL (ref 6.0–8.5)

## 2019-01-03 LAB — LIPID PANEL
Chol/HDL Ratio: 3.6 ratio (ref 0.0–5.0)
Cholesterol, Total: 126 mg/dL (ref 100–199)
HDL: 35 mg/dL — ABNORMAL LOW (ref >39)
LDL Chol Calc (NIH): 67 mg/dL (ref 0–99)
Triglycerides: 134 mg/dL (ref 0–149)
VLDL Cholesterol Cal: 24 mg/dL (ref 5–40)

## 2019-01-03 LAB — CBC
Hematocrit: 47.8 % (ref 37.5–51.0)
Hemoglobin: 16.3 g/dL (ref 13.0–17.7)
MCH: 33.8 pg — ABNORMAL HIGH (ref 26.6–33.0)
MCHC: 34.1 g/dL (ref 31.5–35.7)
MCV: 99 fL — ABNORMAL HIGH (ref 79–97)
Platelets: 134 10*3/uL — ABNORMAL LOW (ref 150–450)
RBC: 4.82 x10E6/uL (ref 4.14–5.80)
RDW: 12.5 % (ref 11.6–15.4)
WBC: 8.7 10*3/uL (ref 3.4–10.8)

## 2019-01-03 LAB — HEMOGLOBIN A1C
Est. average glucose Bld gHb Est-mCnc: 117 mg/dL
Hgb A1c MFr Bld: 5.7 % — ABNORMAL HIGH (ref 4.8–5.6)

## 2019-03-29 DIAGNOSIS — K604 Rectal fistula: Secondary | ICD-10-CM | POA: Insufficient documentation

## 2019-05-06 ENCOUNTER — Ambulatory Visit (AMBULATORY_SURGERY_CENTER): Payer: Medicare Other | Admitting: *Deleted

## 2019-05-06 ENCOUNTER — Other Ambulatory Visit: Payer: Self-pay

## 2019-05-06 VITALS — Temp 97.1°F | Ht 64.0 in | Wt 199.6 lb

## 2019-05-06 DIAGNOSIS — Z1159 Encounter for screening for other viral diseases: Secondary | ICD-10-CM

## 2019-05-06 DIAGNOSIS — Z86010 Personal history of colonic polyps: Secondary | ICD-10-CM

## 2019-05-06 MED ORDER — SUPREP BOWEL PREP KIT 17.5-3.13-1.6 GM/177ML PO SOLN: 1.0000 | Freq: Once | ORAL | 0 refills | Status: AC

## 2019-05-06 NOTE — Progress Notes (Signed)
No egg or soy allergy known to patient  No issues with past sedation with any surgeries  or procedures, no intubation problems  No diet pills per patient No home 02 use per patient  No blood thinners per patient  Pt denies issues with constipation  No A fib or A flutter  EMMI video sent to pt's e mail   Due to the COVID-19 pandemic we are asking patients to follow these guidelines. Please only bring one care partner. Please be aware that your care partner may wait in the car in the parking lot or if they feel like they will be too hot to wait in the car, they may wait in the lobby on the 4th floor. All care partners are required to wear a mask the entire time (we do not have any that we can provide them), they need to practice social distancing, and we will do a Covid check for all patient's and care partners when you arrive. Also we will check their temperature and your temperature. If the care partner waits in their car they need to stay in the parking lot the entire time and we will call them on their cell phone when the patient is ready for discharge so they can bring the car to the front of the building. Also all patient's will need to wear a mask into building.

## 2019-05-17 ENCOUNTER — Ambulatory Visit (INDEPENDENT_AMBULATORY_CARE_PROVIDER_SITE_OTHER): Payer: Medicare Other

## 2019-05-17 ENCOUNTER — Other Ambulatory Visit: Payer: Self-pay

## 2019-05-17 ENCOUNTER — Other Ambulatory Visit: Payer: Self-pay | Admitting: Gastroenterology

## 2019-05-17 DIAGNOSIS — Z1159 Encounter for screening for other viral diseases: Secondary | ICD-10-CM

## 2019-05-18 LAB — SARS CORONAVIRUS 2 (TAT 6-24 HRS): SARS Coronavirus 2: NEGATIVE

## 2019-05-22 ENCOUNTER — Ambulatory Visit (AMBULATORY_SURGERY_CENTER): Payer: Medicare Other | Admitting: Gastroenterology

## 2019-05-22 ENCOUNTER — Encounter: Payer: Self-pay | Admitting: Gastroenterology

## 2019-05-22 ENCOUNTER — Other Ambulatory Visit (INDEPENDENT_AMBULATORY_CARE_PROVIDER_SITE_OTHER): Payer: Medicare Other

## 2019-05-22 ENCOUNTER — Other Ambulatory Visit: Payer: Self-pay

## 2019-05-22 ENCOUNTER — Other Ambulatory Visit: Payer: Self-pay | Admitting: Emergency Medicine

## 2019-05-22 VITALS — BP 135/76 | HR 61 | Temp 97.1°F | Resp 16 | Ht 64.0 in | Wt 199.0 lb

## 2019-05-22 DIAGNOSIS — D122 Benign neoplasm of ascending colon: Secondary | ICD-10-CM

## 2019-05-22 DIAGNOSIS — D128 Benign neoplasm of rectum: Secondary | ICD-10-CM | POA: Diagnosis not present

## 2019-05-22 DIAGNOSIS — Z86010 Personal history of colonic polyps: Secondary | ICD-10-CM | POA: Diagnosis not present

## 2019-05-22 DIAGNOSIS — K5730 Diverticulosis of large intestine without perforation or abscess without bleeding: Secondary | ICD-10-CM | POA: Diagnosis not present

## 2019-05-22 DIAGNOSIS — C20 Malignant neoplasm of rectum: Secondary | ICD-10-CM | POA: Diagnosis not present

## 2019-05-22 DIAGNOSIS — K6389 Other specified diseases of intestine: Secondary | ICD-10-CM

## 2019-05-22 DIAGNOSIS — C189 Malignant neoplasm of colon, unspecified: Secondary | ICD-10-CM

## 2019-05-22 LAB — COMPREHENSIVE METABOLIC PANEL
ALT: 17 U/L (ref 0–53)
AST: 15 U/L (ref 0–37)
Albumin: 4.3 g/dL (ref 3.5–5.2)
Alkaline Phosphatase: 62 U/L (ref 39–117)
BUN: 8 mg/dL (ref 6–23)
CO2: 28 mEq/L (ref 19–32)
Calcium: 9.2 mg/dL (ref 8.4–10.5)
Chloride: 104 mEq/L (ref 96–112)
Creatinine, Ser: 0.94 mg/dL (ref 0.40–1.50)
GFR: 80 mL/min (ref >60.00)
Glucose, Bld: 97 mg/dL (ref 70–99)
Potassium: 4.2 mEq/L (ref 3.5–5.1)
Sodium: 140 mEq/L (ref 135–145)
Total Bilirubin: 0.7 mg/dL (ref 0.2–1.2)
Total Protein: 7.3 g/dL (ref 6.0–8.3)

## 2019-05-22 LAB — CBC
HCT: 46.6 % (ref 39.0–52.0)
Hemoglobin: 16.3 g/dL (ref 13.0–17.0)
MCHC: 34.9 g/dL (ref 30.0–36.0)
MCV: 97.3 fl (ref 78.0–100.0)
Platelets: 142 10*3/uL — ABNORMAL LOW (ref 150.0–400.0)
RBC: 4.79 Mil/uL (ref 4.22–5.81)
RDW: 13 % (ref 11.5–15.5)
WBC: 8.1 10*3/uL (ref 4.0–10.5)

## 2019-05-22 MED ORDER — CIPROFLOXACIN HCL 500 MG PO TABS: 500.0000 mg | ORAL_TABLET | Freq: Two times a day (BID) | ORAL | 0 refills | Status: AC

## 2019-05-22 MED ORDER — SODIUM CHLORIDE 0.9 % IV SOLN: 500.0000 mL | Freq: Once | INTRAVENOUS | Status: AC

## 2019-05-22 MED ORDER — METRONIDAZOLE 500 MG PO TABS: 500.0000 mg | ORAL_TABLET | Freq: Three times a day (TID) | ORAL | 0 refills | Status: AC

## 2019-05-22 NOTE — Op Note (Addendum)
[facility] Patient Name: [name] [name] Procedure Date: 05/22/2019 8:33 AM MRN: [id number] Endoscopist: [place]. Loletha Carrow , MD Age: 69 Referring MD:  Date of Birth: 11/21/1950 Gender: Male Account #: [id number] Procedure:                Colonoscopy Indications:              Surveillance: Personal history of adenomatous                            polyps on last colonoscopy > 5 years ago (4 tubular                            adenomas 10/2012 - each < 68m)                           Patient reported ongoing difficulty with peri-anal                            tender/draining lesion. Records show ED drainage of                            peri-anal abscess 03/29/19. Surgical office                            appointment 04/22/19 [place]. Patient                            states problem recurred in last few weeks. Medicines:                Monitored Anesthesia Care Procedure:                Pre-Anesthesia Assessment:                           - Prior to the procedure, a History and Physical                            was performed, and patient medications and                            allergies were reviewed. The patient's tolerance of                            previous anesthesia was also reviewed. The risks                            and benefits of the procedure and the sedation                            options and risks were discussed with the patient.  All questions were answered, and informed consent                            was obtained. Prior Anticoagulants: The patient has                            taken no previous anticoagulant or antiplatelet                            agents. ASA Grade Assessment: III - A patient with                            severe systemic disease. After reviewing the risks                            and benefits, the patient was deemed  in                            satisfactory condition to undergo the procedure.                           After obtaining informed consent, the colonoscope                            was passed under direct vision. Throughout the                            procedure, the patient's blood pressure, pulse, and                            oxygen saturations were monitored continuously. The                            Colonoscope was introduced through the anus and                            advanced to the the terminal ileum, with                            identification of the appendiceal orifice and IC                            valve. The colonoscopy was performed without                            difficulty. The patient tolerated the procedure                            well. The quality of the bowel preparation was                            good. The terminal ileum, ileocecal valve,  appendiceal orifice, and rectum were photographed. Scope In: 8:49:03 AM Scope Out: 9:23:46 AM Scope Withdrawal Time: 0 hours 32 minutes 26 seconds  Total Procedure Duration: 0 hours 34 minutes 43 seconds  Findings:                 The perianal exam findings include a fullness with                            overlying mild erythema right peri-anal area.                            Purulent material expressed from opening at about                            the 10 O 'clock position. No palpable abnormality                            on DRE. No anal fistulous opening or fissure seen                            on anoscopy.                           The terminal ileum appeared normal.                           Three sessile polyps were found in the rectum (1)                            and ascending colon (2). The polyps were 3 to 5 mm                            in size. These polyps were removed with a cold                            snare. Resection and retrieval were complete.                            Multiple diverticula were found in the left colon.                           A fungating and ulcerated partially obstructing                            mass was found at approximately the splenic flexure                            (anatomical landmarks obscured). The mass was                            circumferential. The mass measured approximately                            four cm in length, and approximately 54m  in                            diameter (the adult colonoscope was able to pass                            with mild resistance). No bleeding was present                            until scope passed through the area. Biopsies were                            taken with a cold forceps for histology. Area was                            tattooed with an injection of 2 mL of Spot - two                            injections of 0.5 ml each about 5cm proximal and                            distal to the mass)                           The exam was otherwise without abnormality on                            direct and retroflexion views. Estimated Blood Loss:     Estimated blood loss was minimal. Impression:               - A fullness with overlying mild erythema right                            peri-anal area. Purulent material found on perianal                            exam.                           - The examined portion of the ileum was normal.                           - Three 3 to 5 mm polyps in the rectum and in the                            ascending colon, removed with a cold snare.                            Resected and retrieved.                           - Diverticulosis in the left colon.                           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 Biopsied. Tattooed.                           - The examination was otherwise normal on direct                            and retroflexion  views. Recommendation:           - Patient has a contact number available for                            emergencies. The signs and symptoms of potential                            delayed complications were discussed with the                            patient. Return to normal activities tomorrow.                            Written discharge instructions were provided to the                            patient.                           - Resume previous diet.                           - Continue present medications.                           - Cipro (ciprofloxacin) 500 mg PO BID for 10 days.                            Disp #20 tabs, refill zero                           - Flagyl (metronidazole) 500 mg PO TID for 10 days.                            Disp # 30 tabs, refills zero                           - Await pathology results.                           - Repeat colonoscopy is recommended for                            surveillance. The colonoscopy date will be  determined after pathology results from today's                            exam become available for review.                           - CBC, CMP, CEA today                           - Surgical plan for all findings pending biopsy                            results and CT scan (to be ordered after biopsy                            results). Peri-anal abscess will need further                            incision and drainage.                           - Miralax 1 capful (17 grams) in 8 ounces of water                            PO daily. Brigitt Mcclish L. Loletha Carrow, MD 05/22/2019 9:43:09 AM This report has been signed electronically.

## 2019-05-22 NOTE — Patient Instructions (Addendum)
YOU HAD AN ENDOSCOPIC PROCEDURE TODAY AT [facility]:   Refer to the procedure report that was given to you for any specific questions about what was found during the examination.  If the procedure report does not answer your questions, please call your gastroenterologist to clarify.  If you requested that your care partner not be given the details of your procedure findings, then the procedure report has been included in a sealed envelope for you to review at your convenience later.  YOU SHOULD EXPECT: Some feelings of bloating in the abdomen. Passage of more gas than usual.  Walking can help get rid of the air that was put into your GI tract during the procedure and reduce the bloating. If you had a lower endoscopy (such as a colonoscopy or flexible sigmoidoscopy) you may notice spotting of blood in your stool or on the toilet paper. If you underwent a bowel prep for your procedure, you may not have a normal bowel movement for a few days.  Please Note:  You might notice some irritation and congestion in your nose or some drainage.  This is from the oxygen used during your procedure.  There is no need for concern and it should clear up in a day or so.  SYMPTOMS TO REPORT IMMEDIATELY:   Following lower endoscopy (colonoscopy or flexible sigmoidoscopy):  Excessive amounts of blood in the stool  Significant tenderness or worsening of abdominal pains  Swelling of the abdomen that is new, acute  Fever of 100F or higher   For urgent or emergent issues, a gastroenterologist can be reached at any hour by calling [phone].   DIET:  We do recommend a small meal at first, but then you may proceed to your regular diet.  Drink plenty of fluids but you should avoid alcoholic beverages for 24 hours.  MEDICATIONS: Continue present medications. Start Ciprofloxacin (Cipro) 500 mg by mouth twice daily for 10 days (dispense 20 tabs, no refills). Start Flagyl (metronidazole) 500 mg by mouth  three times daily for 10 days (dispense 30 tabs, no refills). Start Mirilax 1 capful (17 grams) in 8 ounces of water by mouth daily (this is an over-the-counter medication).  Go to lab upon discharge to have blood draw for CBC, CMP, and CEA today.  PLAN: Surgical plan for all findings pending biopsy results and CT scan (to be ordered after the biopsy results are returned). Patient given 2 bottles of ReadyCat oral contrast to take home for CT scan. Peri-anal abscess will need further incision and drainage. Dr. Loletha Carrow' nurse will call you to schedule the CT scan and further follow-up appointments.  Surgeon: Randolm Idol, MD ([facility]) in [place]. [phone]  Please see handouts given to you by your recovery nurse.  ACTIVITY:  You should plan to take it easy for the rest of today and you should NOT DRIVE or use heavy machinery until tomorrow (because of the sedation medicines used during the test).    FOLLOW UP: Our staff will call the number listed on your records 48-72 hours following your procedure to check on you and address any questions or concerns that you may have regarding the information given to you following your procedure. If we do not reach you, we will leave a message.  We will attempt to reach you two times.  During this call, we will ask if you have developed any symptoms of COVID 19. If you develop any symptoms (ie: fever, flu-like symptoms, shortness of breath, cough  etc.) before then, please call [phone].  If you test positive for Covid 19 in the 2 weeks post procedure, please call and report this information to [place].    If any biopsies were taken you will be contacted by phone or by letter within the next 1-3 weeks.  Please call us at [phone] if you have not heard about the biopsies in 3 weeks.   Thank you for allowing [place] to provide for your healthcare needs today.   SIGNATURES/CONFIDENTIALITY: You and/or your care partner have signed paperwork which  will be entered into your electronic medical record.  These signatures attest to the fact that that the information above on your After Visit Summary has been reviewed and is understood.  Full responsibility of the confidentiality of this discharge information lies with you and/or your care-partner.

## 2019-05-22 NOTE — Progress Notes (Signed)
Pt's states no medical or surgical changes since previsit or office visit.  [name] [name]

## 2019-05-22 NOTE — Progress Notes (Signed)
Called to room to assist during endoscopic procedure.  Patient ID and intended procedure confirmed with present staff. Received instructions for my participation in the procedure from the performing physician.  

## 2019-05-22 NOTE — Progress Notes (Signed)
To PACU, VSS. Report to [email or web address] 

## 2019-05-23 LAB — CEA: CEA: 2.3 ng/mL

## 2019-05-24 ENCOUNTER — Other Ambulatory Visit: Payer: Self-pay

## 2019-05-24 ENCOUNTER — Telehealth: Payer: Self-pay | Admitting: *Deleted

## 2019-05-24 DIAGNOSIS — C189 Malignant neoplasm of colon, unspecified: Secondary | ICD-10-CM

## 2019-05-24 DIAGNOSIS — K6389 Other specified diseases of intestine: Secondary | ICD-10-CM

## 2019-05-24 NOTE — Telephone Encounter (Signed)
  Follow up Call-  Call back number 05/22/2019  Post procedure Call Back phone  # [phone]  Permission to leave phone message Yes  Some recent data might be hidden     Patient questions:  Do you have a fever, pain , or abdominal swelling? No. Pain Score  0 *  Have you tolerated food without any problems? Yes.    Have you been able to return to your normal activities? Yes.    Do you have any questions about your discharge instructions: Diet   No. Medications  No. Follow up visit  No.  Do you have questions or concerns about your Care? No.  Actions: * If pain score is 4 or above: No action needed, pain <4.  1. Have you developed a fever since your procedure? no  2.   Have you had an respiratory symptoms (SOB or cough) since your procedure? no  3.   Have you tested positive for COVID 19 since your procedure no  4.   Have you had any family members/close contacts diagnosed with the COVID 19 since your procedure?  no   If yes to any of these questions please route to Joylene John, RN and Alphonsa Gin, Therapist, sports.

## 2019-05-24 NOTE — Telephone Encounter (Signed)
  Follow up Call-  Call back number 05/22/2019  Post procedure Call Back phone  # [phone]  Permission to leave phone message Yes  Some recent data might be hidden     Patient questions:  Do you have a fever, pain , or abdominal swelling? No. Pain Score  0 *  Have you tolerated food without any problems? Yes.    Have you been able to return to your normal activities? Yes.    Do you have any questions about your discharge instructions: Diet   No. Medications  No. Follow up visit  No.  Do you have questions or concerns about your Care? No.  Actions: * If pain score is 4 or above: No action needed, pain <4.   1. Have you developed a fever since your procedure? No   2.   Have you had an respiratory symptoms (SOB or cough) since your procedure? no  3.   Have you tested positive for COVID 19 since your procedure no  4.   Have you had any family members/close contacts diagnosed with the COVID 19 since your procedure? no   If yes to any of these questions please route to Joylene John, RN and Alphonsa Gin, Therapist, sports.

## 2019-05-31 ENCOUNTER — Encounter (HOSPITAL_COMMUNITY): Payer: Self-pay | Admitting: Radiology

## 2019-05-31 ENCOUNTER — Ambulatory Visit (HOSPITAL_COMMUNITY)
Admission: RE | Admit: 2019-05-31 | Discharge: 2019-05-31 | Disposition: A | Discharge status: Home / Self Care | Payer: Medicare Other | Source: Ambulatory Visit | Attending: Gastroenterology | Admitting: Gastroenterology

## 2019-05-31 ENCOUNTER — Other Ambulatory Visit: Payer: Self-pay

## 2019-05-31 DIAGNOSIS — C189 Malignant neoplasm of colon, unspecified: Secondary | ICD-10-CM | POA: Insufficient documentation

## 2019-05-31 MED ORDER — SODIUM CHLORIDE (PF) 0.9 % IJ SOLN
INTRAMUSCULAR | Status: AC
  Filled 2019-05-31: qty 50

## 2019-05-31 MED ORDER — IOHEXOL 300 MG/ML  SOLN
100.0000 mL | Freq: Once | INTRAMUSCULAR | Status: AC | PRN
  Administered 2019-05-31: 100 mL via INTRAVENOUS

## 2019-06-03 ENCOUNTER — Telehealth: Payer: Self-pay

## 2019-06-03 ENCOUNTER — Telehealth: Payer: Self-pay | Admitting: Hematology

## 2019-06-03 ENCOUNTER — Encounter: Payer: Self-pay | Admitting: Surgery

## 2019-06-03 DIAGNOSIS — C186 Malignant neoplasm of descending colon: Secondary | ICD-10-CM | POA: Insufficient documentation

## 2019-06-03 NOTE — Progress Notes (Signed)
[facility]   Telephone:([phone]) [phone] Fax:([phone]) [facility] Note   Patient Care Team: Corine Shelter, PA-C as PCP - General (Physician Assistant) Jonnie Finner, RN as Oncology Nurse Navigator Michael Boston, MD as Consulting Physician (General Surgery) Minus Breeding, MD as Consulting Physician (Cardiology) Loletha Carrow Kirke Corin, MD as Consulting Physician (Gastroenterology) Truitt Merle, MD as Consulting Physician (Oncology)  Date of Service:  06/06/2019   CHIEF COMPLAINTS/PURPOSE OF CONSULTATION:  Newly Diagnosed colon cancer   REFERRING PHYSICIAN:  Dr Loletha Carrow  Oncology History Overview Note  Cancer Staging No matching staging information was found for the patient.    Cancer of left colon ([facility])  05/22/2019 Procedure   Colonoscopy by Dr Loletha Carrow 05/22/19  IMPRESSION - A fullness with overlying mild erythema right peri-anal area. Purulent material found on perianal exam. - The examined portion of the ileum was normal. - Three 3 to 5 mm polyps in the rectum and in the ascending colon, removed with a cold snare. Resected and retrieved. - Diverticulosis in the left colon. - Likely malignant partially obstructing tumor at the splenic flexure. Biopsied. Tattooed. - The examination was otherwise normal on direct and retroflexion views.   05/22/2019 Initial Biopsy   Diagnosis 05/22/19 1. Ascending Colon Polyp, rectal, polyps, 3 - TUBULAR ADENOMA(S) WITHOUT HIGH-GRADE DYSPLASIA OR MALIGNANCY - INFLAMMATORY POLYP(S) 2. Descending Colon Biopsy, colon mass - ADENOCARCINOMA. SEE NOTE   05/31/2019 Imaging   CT CAP W Contrast 05/31/19  IMPRESSION: 1. Apple-core lesion described on recent colonoscopy report is identified in the proximal to mid transverse colon. This lesion measures about 3.8 cm in length and is associated with small lymph nodes in the omentum just inferior to the lesion. 2. 1.6 cm ill-defined low-density lesion in the medial segment  left liver, highly suspicious for metastatic disease. MRI abdomen with and without contrast would likely prove helpful to further evaluate. 3. Upper normal hepatoduodenal ligament lymph nodes. Attention on follow-up imaging recommended. 4. Tiny bilateral pulmonary nodules measuring up to 4 mm. Unlikely to be metastatic, but attention on follow-up recommended. 5. Tiny hiatal hernia. 6. Aortic Atherosclerosis (ICD10-I70.0).   06/03/2019 Initial Diagnosis   Cancer of left colon ([facility])   06/05/2019 Imaging   MRI abdomen  IMPRESSION: 1. 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 No definite signs of metastatic disease to the liver. 2. Probable iron deposition in the spleen. 3. Aortic atherosclerosis.        HISTORY OF PRESENTING ILLNESS:  [name] [name] 69 y.o. male is a here because of newly diagnosed colon cancer. The patient was referred by Dr Loletha Carrow. The patient presents to the clinic today accompanied by his wife.   He presented to emergency room around Christmas last year, for rectal pain.  He was found to have a perirectal abscess, which he underwent I and D, and took a course of antibiotics.  This has gradually improved.  He was referred to GI, Dr. Simona Huh did a colonoscopy on May 22, 2019, which showed a few polyps and a 4 cm partially obstructing mass in the splenic flexure, biopsy showed adenocarcinoma.  He has seen colorectal surgeon Dr. Johney Maine earlier this week.  His staging CT scan showed a liver lesion, which was subsequently evaluated by liver MRI yesterday.  Per patient, he had liver lesion in the past, but he does not remember where he had the scan before.   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  He feels well overall, rectal pain has nearly resolved, he denies any abdominal discomfort, nausea, or other discomfort.  He has good appetite and energy level, no recent weight  loss.  His past medical history is significant for well-controlled diabetes and hypertension, mild COPD, coronary artery disease status post stent placement.  He is a heavy smoker, no alcohol or drug abuse.  REVIEW OF SYSTEMS:    Constitutional: Denies fevers, chills or abnormal night sweats Eyes: Denies blurriness of vision, double vision or watery eyes Ears, nose, mouth, throat, and face: Denies mucositis or sore throat Respiratory: Denies cough, dyspnea or wheezes Cardiovascular: Denies palpitation, chest discomfort or lower extremity swelling Gastrointestinal:  Denies nausea, heartburn or change in bowel habits (except mild diarrhea when he was on antibiotics), occasional hematochezia, rectal pain has much improved. Skin: Denies abnormal skin rashes Lymphatics: Denies new lymphadenopathy or easy bruising Neurological:Denies numbness, tingling or new weaknesses Behavioral/Psych: Mood is stable, no new changes  All other systems were reviewed with the patient and are negative.  MEDICAL HISTORY:  Past Medical History:  Diagnosis Date  . Arthritis   . COPD (chronic obstructive pulmonary disease) (HCC)    mild per pt.  . Coronary artery disease    cardiac catheterization on January 26,2010, right coronary arter distal occlusion with stenting of this with xience drug-eluting stent.  Pt also had liminal irregularities  in the LAD and 40-50% mid circumflex  stenosis  . Diabetes mellitus without complication ([facility])   . Hyperlipidemia   . Hypertension   . Myocardial infarction ([facility]) 2010  . Nephrolithiasis   . Ulcer     SURGICAL HISTORY: Past Surgical History:  Procedure Laterality Date  . COLONOSCOPY    . CORONARY ANGIOPLASTY WITH STENT PLACEMENT  2010  . POLYPECTOMY      SOCIAL HISTORY: Social History   Socioeconomic History  . Marital status: Married    Spouse name: Not on file  . Number of children: 3  . Years of education: Not on file  . Highest education level: Not on  file  Occupational History  . Occupation: Carpenter  Tobacco Use  . Smoking status: Current Every Day Smoker    Packs/day: 1.00    Years: 30.00    Pack years: 30.00    Types: Cigarettes  . Smokeless tobacco: Former Network engineer and Sexual Activity  . Alcohol use: No  . Drug use: No  . Sexual activity: Not on file  Other Topics Concern  . Not on file  Social History Narrative  . Not on file   Social Determinants of Health   Financial Resource Strain:   . Difficulty of Paying Living Expenses: Not on file  Food Insecurity:   . Worried About Charity fundraiser in the Last Year: Not on file  . Ran Out of Food in the Last Year: Not on file  Transportation Needs:   . Lack of Transportation (Medical): Not on file  . Lack of Transportation (Non-Medical): Not on file  Physical Activity:   . Days of Exercise per Week: Not on file  . Minutes of Exercise per Session: Not on file  Stress:   . Feeling of Stress : Not on file  Social Connections:   . Frequency of Communication with Friends and Family: Not on file  . Frequency of Social Gatherings with Friends and Family: Not on file  . Attends Religious Services: Not on file  .  Active Member of Clubs or Organizations: Not on file  . Attends Archivist Meetings: Not on file  . Marital Status: Not on file  Intimate Partner Violence:   . Fear of Current or Ex-Partner: Not on file  . Emotionally Abused: Not on file  . Physically Abused: Not on file  . Sexually Abused: Not on file    FAMILY HISTORY: Family History  Problem Relation Age of Onset  . Colon cancer Mother 53  . Diabetes Mother   . Lung cancer Father   . Colon polyps Neg Hx   . Esophageal cancer Neg Hx   . Rectal cancer Neg Hx   . Stomach cancer Neg Hx     ALLERGIES:  is allergic to clopidogrel bisulfate.  MEDICATIONS:  Current Outpatient Medications  Medication Sig Dispense Refill  . aspirin EC 81 MG tablet Take 81 mg by mouth daily.    Marland Kitchen b  complex vitamins tablet Take 1 tablet by mouth daily.    . B COMPLEX-C PO Take by mouth.    . cyanocobalamin 1000 MCG tablet Take 1,000 mcg by mouth daily.    Marland Kitchen lisinopril (PRINIVIL,ZESTRIL) 10 MG tablet TAKE 1 TABLET BY MOUTH EVERY DAY 30 tablet 3  . metFORMIN (GLUCOPHAGE) 500 MG tablet Take 1 tablet by mouth daily with breakfast.     . nitroGLYCERIN (NITROSTAT) 0.4 MG SL tablet Place 1 tablet (0.4 mg total) under the tongue every 5 (five) minutes as needed. 25 tablet 2  . pantoprazole (PROTONIX) 40 MG tablet Take 40 mg by mouth every other day.    . simvastatin (ZOCOR) 40 MG tablet TAKE 1 TABLET (40 MG TOTAL) BY MOUTH AT BEDTIME. 30 tablet 6   Current Facility-Administered Medications  Medication Dose Route Frequency Provider Last Rate Last Admin  . 0.9 %  sodium chloride infusion  500 mL Intravenous Once Nelida Meuse III, MD        PHYSICAL EXAMINATION: ECOG PERFORMANCE STATUS: 0 - Asymptomatic  Vitals:   06/06/19 0800  BP: (!) 143/81  Pulse: 77  Resp: 17  Temp: 98 F (36.7 C)  SpO2: 100%   Filed Weights   06/06/19 0800  Weight: 195 lb 14.4 oz (88.9 kg)    GENERAL:alert, no distress and comfortable SKIN: skin color, texture, turgor are normal, no rashes or significant lesions EYES: normal, Conjunctiva are pink and non-injected, sclera clear NECK: supple, thyroid normal size, non-tender, without nodularity LYMPH:  no palpable lymphadenopathy in the cervical, axillary  LUNGS: clear to auscultation and percussion with normal breathing effort HEART: regular rate & rhythm and no murmurs and no lower extremity edema ABDOMEN:abdomen soft, non-tender and normal bowel sounds, rectal exam deferred  Musculoskeletal:no cyanosis of digits and no clubbing  NEURO: alert & oriented x 3 with fluent speech, no focal motor/sensory deficits  LABORATORY DATA:  I have reviewed the data as listed CBC Latest Ref Rng & Units 05/22/2019 01/02/2019 09/03/2009  WBC 4.0 - 10.5 K/uL 8.1 8.7 7.8   Hemoglobin 13.0 - 17.0 g/dL 16.3 16.3 15.1  Hematocrit 39.0 - 52.0 % 46.6 47.8 42.8  Platelets 150.0 - 400.0 K/uL 142.0(L) 134(L) 148(L)    CMP Latest Ref Rng & Units 05/22/2019 01/02/2019 09/16/2011  Glucose 70 - 99 mg/dL 97 99 105(H)  BUN 6 - 23 mg/dL 8 14 21   Creatinine 0.40 - 1.50 mg/dL 0.94 0.97 1.1  Sodium 135 - 145 mEq/L 140 141 139  Potassium 3.5 - 5.1 mEq/L 4.2 4.4 4.2  Chloride  96 - 112 mEq/L 104 104 107  CO2 19 - 32 mEq/L 28 25 26   Calcium 8.4 - 10.5 mg/dL 9.2 9.5 8.8  Total Protein 6.0 - 8.3 g/dL 7.3 7.0 -  Total Bilirubin 0.2 - 1.2 mg/dL 0.7 0.5 -  Alkaline Phos 39 - 117 U/L 62 66 -  AST 0 - 37 U/L 15 16 -  ALT 0 - 53 U/L 17 21 -     RADIOGRAPHIC STUDIES: I have personally reviewed the radiological images as listed and agreed with the findings in the report. CT CHEST W CONTRAST  Result Date: 05/31/2019 CLINICAL DATA:  Splenic flexure colon cancer diagnosed at colonoscopy 05/22/2019 EXAM: CT CHEST, ABDOMEN, AND PELVIS WITH CONTRAST TECHNIQUE: Multidetector CT imaging of the chest, abdomen and pelvis was performed following the standard protocol during bolus administration of intravenous contrast. CONTRAST:  140m OMNIPAQUE IOHEXOL 300 MG/ML  SOLN COMPARISON:  CT abdomen 07/13/2012 FINDINGS: CT CHEST FINDINGS Cardiovascular: The heart size is normal. No substantial pericardial effusion. Coronary artery calcification is evident. No thoracic aortic aneurysm. Mediastinum/Nodes: No mediastinal lymphadenopathy. There is no hilar lymphadenopathy. Tiny hiatal hernia. The esophagus has normal imaging features. There is no axillary lymphadenopathy. Lungs/Pleura: 4 mm subpleural nodule noted posterior right upper lobe on 50/4. 2 mm right lower lobe nodule identified on 82/4. 1-2 mm anterior left upper lobe nodule visible on 33/4. Right upper lobe granuloma seen on 61/4. No focal airspace consolidation. No pleural effusion. Musculoskeletal: No worrisome lytic or sclerotic osseous  abnormality. CT ABDOMEN PELVIS FINDINGS Hepatobiliary: 1.6 cm ill-defined low-density lesion is identified in the medial segment left liver (57/2). A tiny 4-5 mm subtle low-density lesion is seen in the posterior right liver on 65/2 and is similar low-density lesions seen on 61/2 in the posterior right liver. There is no evidence for gallstones, gallbladder wall thickening, or pericholecystic fluid. There is no evidence for gallstones, gallbladder wall thickening, or pericholecystic fluid. No intrahepatic or extrahepatic biliary dilation. Pancreas: No focal mass lesion. No dilatation of the main duct. No intraparenchymal cyst. No peripancreatic edema. Spleen: No splenomegaly. No focal mass lesion. Adrenals/Urinary Tract: No adrenal nodule or mass. Kidneys unremarkable. No evidence for hydroureter. The urinary bladder appears normal for the degree of distention. Stomach/Bowel: Tiny hiatal hernia. Stomach otherwise unremarkable. Duodenum is normally positioned as is the ligament of Treitz. No small bowel wall thickening. No small bowel dilatation. The cecum is high in the midline abdomen, above the level of the umbilicus. The terminal ileum is normal. The appendix is normal. Apple-core lesion described on recent colonoscopy report is identified in the proximal to mid transverse colon (see image 73/2 and well demonstrated on coronal 33/5. This lesion measures about 3.8 cm in length in there is no dilatation of the colon proximal to the lesion to suggest obstruction. Oral contrast is distal to the lesion in the left colon. Diverticular changes are noted in the left colon without evidence of diverticulitis. Vascular/Lymphatic: There is abdominal aortic atherosclerosis without aneurysm. Upper normal rounded lymph nodes are seen in the hepatoduodenal ligament measuring 10 mm short axis diameter. No retroperitoneal lymphadenopathy. Small lymph nodes are seen in the omentum just inferior to the transverse colon lesion  (images 76 and 77 of series 2) No pelvic sidewall lymphadenopathy. Reproductive: The prostate gland and seminal vesicles are unremarkable. Other: No intraperitoneal free fluid. Musculoskeletal: No worrisome lytic or sclerotic osseous abnormality. IMPRESSION: 1. Apple-core lesion described on recent colonoscopy report is identified in the proximal to mid transverse colon. This  lesion measures about 3.8 cm in length and is associated with small lymph nodes in the omentum just inferior to the lesion. 2. 1.6 cm ill-defined low-density lesion in the medial segment left liver, highly suspicious for metastatic disease. MRI abdomen with and without contrast would likely prove helpful to further evaluate. 3. Upper normal hepatoduodenal ligament lymph nodes. Attention on follow-up imaging recommended. 4. Tiny bilateral pulmonary nodules measuring up to 4 mm. Unlikely to be metastatic, but attention on follow-up recommended. 5. Tiny hiatal hernia. 6. Aortic Atherosclerosis (ICD10-I70.0). Electronically Signed   By: Misty Stanley M.D.   On: 05/31/2019 09:03   MR ABDOMEN WWO CONTRAST  Result Date: 06/05/2019 CLINICAL DATA:  69 year old male with history of adenocarcinoma of the transverse colon. Evaluate for potential liver lesion. EXAM: MRI ABDOMEN WITHOUT AND WITH CONTRAST TECHNIQUE: Multiplanar multisequence MR imaging of the abdomen was performed both before and after the administration of intravenous contrast. CONTRAST:  41m GADAVIST GADOBUTROL 1 MMOL/ML IV SOLN COMPARISON:  No prior abdominal MRI. CT the abdomen and pelvis 05/31/2019. FINDINGS: Lower chest: Unremarkable. Hepatobiliary: The lesion of concern is in segment 4A of the liver (axial image 9 of series 12) measuring 1.4 cm and is low T1 signal intensity, slightly T2 hyperintense, demonstrates early peripheral hyperenhancement with progressive nodular incomplete centripetal filling on delayed images, most suggestive of a cavernous hemangioma. No other suspicious  cystic or solid hepatic lesions. No intra or extrahepatic biliary ductal dilatation. Gallbladder is normal in appearance. Pancreas: No pancreatic mass. No pancreatic ductal dilatation. No pancreatic or peripancreatic fluid collections or inflammatory changes. Spleen: Mild diffuse loss of signal intensity throughout the spleen on in phase dual echo images, indicative of iron deposition. Otherwise, unremarkable. Adrenals/Urinary Tract: Bilateral kidneys and adrenal glands are normal in appearance. No hydroureteronephrosis in the visualized portions of the abdomen. Bilateral adrenal glands are normal in appearance. Stomach/Bowel: Known lesion in the mid transverse colon is not well demonstrated on today's abdominal MRI examination. Vascular/Lymphatic: Aortic atherosclerosis, without evidence of aneurysm in the abdominal vasculature. No lymphadenopathy noted in the abdomen. Other: No significant volume of ascites noted in the visualized portions of the peritoneal cavity. Musculoskeletal: No aggressive appearing osseous lesions are noted in the visualized portions of the skeleton. IMPRESSION: 1. 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 No definite signs of metastatic disease to the liver. 2. Probable iron deposition in the spleen. 3. Aortic atherosclerosis. Electronically Signed   By: DVinnie LangtonM.D.   On: 06/05/2019 09:16   CT Abdomen Pelvis W Contrast  Result Date: 05/31/2019 CLINICAL DATA:  Splenic flexure colon cancer diagnosed at colonoscopy 05/22/2019 EXAM: CT CHEST, ABDOMEN, AND PELVIS WITH CONTRAST TECHNIQUE: Multidetector CT imaging of the chest, abdomen and pelvis was performed following the standard protocol during bolus administration of intravenous contrast. CONTRAST:  1029mOMNIPAQUE IOHEXOL 300 MG/ML  SOLN COMPARISON:  CT abdomen 07/13/2012 FINDINGS: CT CHEST FINDINGS Cardiovascular: The heart size is normal. No  substantial pericardial effusion. Coronary artery calcification is evident. No thoracic aortic aneurysm. Mediastinum/Nodes: No mediastinal lymphadenopathy. There is no hilar lymphadenopathy. Tiny hiatal hernia. The esophagus has normal imaging features. There is no axillary lymphadenopathy. Lungs/Pleura: 4 mm subpleural nodule noted posterior right upper lobe on 50/4. 2 mm right lower lobe nodule identified on 82/4. 1-2 mm anterior left upper lobe nodule visible on 33/4. Right upper lobe granuloma seen on 61/4. No focal airspace consolidation. No pleural effusion. Musculoskeletal: No worrisome lytic  or sclerotic osseous abnormality. CT ABDOMEN PELVIS FINDINGS Hepatobiliary: 1.6 cm ill-defined low-density lesion is identified in the medial segment left liver (57/2). A tiny 4-5 mm subtle low-density lesion is seen in the posterior right liver on 65/2 and is similar low-density lesions seen on 61/2 in the posterior right liver. There is no evidence for gallstones, gallbladder wall thickening, or pericholecystic fluid. There is no evidence for gallstones, gallbladder wall thickening, or pericholecystic fluid. No intrahepatic or extrahepatic biliary dilation. Pancreas: No focal mass lesion. No dilatation of the main duct. No intraparenchymal cyst. No peripancreatic edema. Spleen: No splenomegaly. No focal mass lesion. Adrenals/Urinary Tract: No adrenal nodule or mass. Kidneys unremarkable. No evidence for hydroureter. The urinary bladder appears normal for the degree of distention. Stomach/Bowel: Tiny hiatal hernia. Stomach otherwise unremarkable. Duodenum is normally positioned as is the ligament of Treitz. No small bowel wall thickening. No small bowel dilatation. The cecum is high in the midline abdomen, above the level of the umbilicus. The terminal ileum is normal. The appendix is normal. Apple-core lesion described on recent colonoscopy report is identified in the proximal to mid transverse colon (see image 73/2  and well demonstrated on coronal 33/5. This lesion measures about 3.8 cm in length in there is no dilatation of the colon proximal to the lesion to suggest obstruction. Oral contrast is distal to the lesion in the left colon. Diverticular changes are noted in the left colon without evidence of diverticulitis. Vascular/Lymphatic: There is abdominal aortic atherosclerosis without aneurysm. Upper normal rounded lymph nodes are seen in the hepatoduodenal ligament measuring 10 mm short axis diameter. No retroperitoneal lymphadenopathy. Small lymph nodes are seen in the omentum just inferior to the transverse colon lesion (images 76 and 77 of series 2) No pelvic sidewall lymphadenopathy. Reproductive: The prostate gland and seminal vesicles are unremarkable. Other: No intraperitoneal free fluid. Musculoskeletal: No worrisome lytic or sclerotic osseous abnormality. IMPRESSION: 1. Apple-core lesion described on recent colonoscopy report is identified in the proximal to mid transverse colon. This lesion measures about 3.8 cm in length and is associated with small lymph nodes in the omentum just inferior to the lesion. 2. 1.6 cm ill-defined low-density lesion in the medial segment left liver, highly suspicious for metastatic disease. MRI abdomen with and without contrast would likely prove helpful to further evaluate. 3. Upper normal hepatoduodenal ligament lymph nodes. Attention on follow-up imaging recommended. 4. Tiny bilateral pulmonary nodules measuring up to 4 mm. Unlikely to be metastatic, but attention on follow-up recommended. 5. Tiny hiatal hernia. 6. Aortic Atherosclerosis (ICD10-I70.0). Electronically Signed   By: Misty Stanley M.D.   On: 05/31/2019 09:03    ASSESSMENT & PLAN:  Amaurie Wandel is a 69 y.o.  male with a history of Arthritis, COPD, CAD, DM, HTN, HLD, s/p stent placement, presented with rectal abscess and colonoscopy showed left side colon cancer.   1. Left side colon cancer, [id number] -I  have reviewed his staging CT and MRI images with patient and his wife in details.  The 1.4 cm lesion in the segment 4A of liver is most compatible with hemangioma.  Per patient, he did have liver lesion old scan, although he does not remember when and where he had the scan.  -I discussed his colonoscopy findings and biopsy pathology of the chronic mass at splenic flexure, which showed adenocarcinoma -We reviewed the natural history of colon cancer, and indication for adjuvant chemotherapy, which is mainly for node positive stage III disease, or stage II disease with high  risk features.  His CEA was normal. -He will proceed with surgical resection by Dr. Johney Maine in near future -I will f/u his surgical pathology results, and see him back if he needs adjuvant chemotherapy. -We discussed colon cancer surveillance after surgery, with lab and physical exam for 5 years.  Surveillance CT scan will be considered for high risk disease.   2.  Perirectal abscess -Near resolved  3.  COPD, coronary artery disease, status post stent placement, diabetes, hypertension, arthritis -Follow-up with PCP and cardiology -He will have cardiac clearance before surgery  4.  Smoking cessation -I strongly encouraged him to stop smoking, he is reluctant to commit     PLAN:  -scan, biopsy path reviewed with pt and his wife -he will proceed with left hemicolectomy with Dr. Johney Maine in near future -I will see him as needed after surgery, if he has high risk disease.   No orders of the defined types were placed in this encounter.   All questions were answered. The patient knows to call the clinic with any problems, questions or concerns. The total time spent in the appointment was 45 minutes.     Truitt Merle, MD 06/06/2019 8:47 AM  I, Joslyn Devon, am acting as scribe for Truitt Merle, MD.   I have reviewed the above documentation for accuracy and completeness, and I agree with the above.

## 2019-06-03 NOTE — Telephone Encounter (Signed)
Scheduled per staff msg. Called and left a msg.

## 2019-06-03 NOTE — Telephone Encounter (Signed)
Chart Review

## 2019-06-04 ENCOUNTER — Other Ambulatory Visit: Payer: Self-pay | Admitting: Surgery

## 2019-06-04 ENCOUNTER — Other Ambulatory Visit (HOSPITAL_COMMUNITY): Payer: Self-pay | Admitting: Surgery

## 2019-06-04 DIAGNOSIS — R160 Hepatomegaly, not elsewhere classified: Secondary | ICD-10-CM

## 2019-06-05 ENCOUNTER — Ambulatory Visit: Payer: Self-pay | Admitting: Surgery

## 2019-06-05 ENCOUNTER — Other Ambulatory Visit: Payer: Self-pay

## 2019-06-05 ENCOUNTER — Ambulatory Visit (HOSPITAL_COMMUNITY)
Admission: RE | Admit: 2019-06-05 | Discharge: 2019-06-05 | Disposition: A | Discharge status: Home / Self Care | Payer: Medicare Other | Source: Ambulatory Visit | Attending: Surgery | Admitting: Surgery

## 2019-06-05 DIAGNOSIS — R160 Hepatomegaly, not elsewhere classified: Secondary | ICD-10-CM

## 2019-06-05 MED ORDER — GADOBUTROL 1 MMOL/ML IV SOLN
8.0000 mL | Freq: Once | INTRAVENOUS | Status: AC | PRN
  Administered 2019-06-05: 8 mL via INTRAVENOUS

## 2019-06-05 NOTE — H&P (Signed)
Arvil Chaco Documented: 06/03/2019 3:30 PM Location: [facility] Patient #: [id number] DOB: [birth year] Married / Language: English / Race: White Male   History of Present Illness Adin Hector MD; 06/03/2019 4:57 PM) The patient is a 69 year old male who presents with colorectal cancer. Note for "Colorectal cancer": ` ` ` Patient sent for surgical consultation at the request of Dr Wilfrid Lund, [facility]  Chief Complaint: Newly diagnosed colon cancer. Also history of perirectal abscess and possible fistula ` ` The patient is a active smoking male with strong family history of colon polyps and cancer. Last colonoscopy in 2014. I believe there is a 3 year follow-up recommended for 2017 but that did not happen until this year. Obvious mass noted in the left-sided colon suspicious to be at the splenic flexure. Biopsy consistent with adenocarcinoma. Surgical consultation and medical oncology consult requested. He's had CT of the chest abdomen and pelvis done. CEA is less than 3. 1.6 cm lesion noted and left hepatic lobe. Suspicious for cancer. That just happened a few days ago. Patient also notes he had episode of perianal pain and swelling. Went to urgent care center. Hematoma suspected. Had worsening pain and returned to have a peritoneal abscess lanced in late December 2020. Some persistent intermittent drainage. He tried squeeze out. He had follow-up with Dr. Arvin Collard in [facility] about health system. They felt like everything is healed up. Patient notes that he has had some intermittent drainage. Concern for possible fistula noted on the colonoscopy as well. Surgery consultation requested to help address that as well.  Patient notes he moves his bowels usually on a daily basis. They have been somewhat irregular. He has been on oral Cipro and Flagyl since the colonoscopy. I made his bowels regular. However he feels better. No swelling is gone markedly  down. He really notices anything down there. He does smoke. He does have a history of myocardial infarction requiring stenting in 2010. Follow-up in Dr. Susy [place]. Had a underwhelming exercise tolerance test in 2017. He claims he can walk half hour easily. He's never had any abdominal surgeries. He is a diabetic but is just on oral metformin.  (Review of systems as stated in this history (HPI) or in the review of systems. Otherwise all other 12 point ROS are negative) ` ` `  This patient encounter took 65 minutes today to perform the following: obtain history, perform exam, review outside records, interpret tests & imaging, counsel the patient on their diagnosis; and, document this encounter, including findings & plan in the electronic health record (EHR).   Past Surgical History Antonietta Jewel, [place]; 06/03/2019 3:30 PM) Colon Polyp Removal - Colonoscopy   Diagnostic Studies History (Chanel Teressa Senter, CMA; 06/03/2019 3:30 PM) Colonoscopy  within last year  Allergies (Chanel Teressa Senter, CMA; 06/03/2019 3:31 PM) Plavix *HEMATOLOGICAL AGENTS - MISC.*  Allergies Reconciled   Medication History (Chanel Teressa Senter, CMA; 06/03/2019 3:32 PM) Simvastatin (40MG Tablet, Oral) Active. metroNIDAZOLE (500MG Tablet, Oral) Active. metFORMIN HCl (500MG Tablet, Oral) Active. Lisinopril (10MG Tablet, Oral) Active. Pantoprazole Sodium (40MG Tablet DR, Oral) Active. B & C B-Complex (Oral) Active. Nitroglycerin (0.4MG Tab Sublingual, Sublingual) Active. Medications Reconciled  Social History Antonietta Jewel, CMA; 06/03/2019 3:30 PM) Alcohol use  Remotely quit alcohol use. Caffeine use  Carbonated beverages, Tea. Tobacco use  Current every day smoker.  Family History Antonietta Jewel, [place]; 06/03/2019 3:30 PM) Cancer  Father. Colon Cancer  Mother. Diabetes Mellitus  Mother.  Other Problems Antonietta Jewel, [place]; 06/03/2019  3:30 PM) Arthritis  Back Pain  Diabetes Mellitus  Gastroesophageal Reflux Disease   Hypercholesterolemia  Myocardial infarction     Review of Systems (Chanel Nolan CMA; 06/03/2019 3:30 PM) General Not Present- Appetite Loss, Chills, Fatigue, Fever, Night Sweats, Weight Gain and Weight Loss. Skin Present- Dryness. Not Present- Change in Wart/Mole, Hives, Jaundice, New Lesions, Non-Healing Wounds, Rash and Ulcer. HEENT Not Present- Earache, Hearing Loss, Hoarseness, Nose Bleed, Oral Ulcers, Ringing in the Ears, Seasonal Allergies, Sinus Pain, Sore Throat, Visual Disturbances, Wears glasses/contact lenses and Yellow Eyes. Respiratory Not Present- Bloody sputum, Chronic Cough, Difficulty Breathing, Snoring and Wheezing. Breast Not Present- Breast Mass, Breast Pain, Nipple Discharge and Skin Changes. Cardiovascular Not Present- Chest Pain, Difficulty Breathing Lying Down, Leg Cramps, Palpitations, Rapid Heart Rate, Shortness of Breath and Swelling of Extremities. Gastrointestinal Present- Rectal Pain. Not Present- Abdominal Pain, Bloating, Bloody Stool, Change in Bowel Habits, Chronic diarrhea, Constipation, Difficulty Swallowing, Excessive gas, Gets full quickly at meals, Hemorrhoids, Indigestion, Nausea and Vomiting. Musculoskeletal Present- Back Pain. Not Present- Joint Pain, Joint Stiffness, Muscle Pain, Muscle Weakness and Swelling of Extremities. Neurological Not Present- Decreased Memory, Fainting, Headaches, Numbness, Seizures, Tingling, Tremor, Trouble walking and Weakness. Psychiatric Not Present- Anxiety, Bipolar, Change in Sleep Pattern, Depression, Fearful and Frequent crying. Endocrine Not Present- Cold Intolerance, Excessive Hunger, Hair Changes, Heat Intolerance, Hot flashes and New Diabetes. Hematology Not Present- Blood Thinners, Easy Bruising, Excessive bleeding, Gland problems, HIV and Persistent Infections.  Vitals (Chanel Nolan CMA; 06/03/2019 3:33 PM) 06/03/2019 3:32 PM Weight: 196 lb Height: 64in Body Surface Area: 1.94 m Body Mass Index: 33.64 kg/m   Temp.: 98.96F  Pulse: 90 (Regular)  BP: 130/68 (Sitting, Left Arm, Standard)       Physical Exam Adin Hector MD; 06/03/2019 3:58 PM) General Mental Status-Alert. General Appearance-Not in acute distress, Not Sickly. Orientation-Oriented X3. Hydration-Well hydrated. Voice-Normal.  Integumentary Global Assessment Upon inspection and palpation of skin surfaces of the - Axillae: non-tender, no inflammation or ulceration, no drainage. and Distribution of scalp and body hair is normal. General Characteristics Temperature - normal warmth is noted. Note: Thickened skin diffusely. Somewhat dry. Some numerous actinic keratoses and seborrheic dermatitis   Head and Neck Head-normocephalic, atraumatic with no lesions or palpable masses. Face Global Assessment - atraumatic, no absence of expression. Neck Global Assessment - no abnormal movements, no bruit auscultated on the right, no bruit auscultated on the left, no decreased range of motion, non-tender. Trachea-midline. Thyroid Gland Characteristics - non-tender.  Eye Eyeball - Left-Extraocular movements intact, No Nystagmus - Left. Eyeball - Right-Extraocular movements intact, No Nystagmus - Right. Cornea - Left-No Hazy - Left. Cornea - Right-No Hazy - Right. Sclera/Conjunctiva - Left-No scleral icterus, No Discharge - Left. Sclera/Conjunctiva - Right-No scleral icterus, No Discharge - Right. Pupil - Left-Direct reaction to light normal. Pupil - Right-Direct reaction to light normal.  ENMT Ears Pinna - Left - no drainage observed, no generalized tenderness observed. Pinna - Right - no drainage observed, no generalized tenderness observed. Nose and Sinuses External Inspection of the Nose - no destructive lesion observed. Inspection of the nares - Left - quiet respiration. Inspection of the nares - Right - quiet respiration. Mouth and Throat Lips - Upper Lip - no fissures observed, no  pallor noted. Lower Lip - no fissures observed, no pallor noted. Nasopharynx - no discharge present. Oral Cavity/Oropharynx - Tongue - no dryness observed. Oral Mucosa - no cyanosis observed. Hypopharynx - no evidence of airway distress observed.  Chest and Lung  Exam Inspection Movements - Normal and Symmetrical. Accessory muscles - No use of accessory muscles in breathing. Palpation Palpation of the chest reveals - Non-tender. Auscultation Breath sounds - Normal and Clear.  Cardiovascular Auscultation Rhythm - Regular. Murmurs & Other Heart Sounds - Auscultation of the heart reveals - No Murmurs and No Systolic Clicks.  Abdomen Inspection Inspection of the abdomen reveals - No Visible peristalsis and No Abnormal pulsations. Umbilicus - No Bleeding, No Urine drainage. Palpation/Percussion Palpation and Percussion of the abdomen reveal - Soft, Non Tender, No Rebound tenderness, No Rigidity (guarding) and No Cutaneous hyperesthesia. Note: Abdomen overweight but soft. Mild super umbilical diastases recti. No umbilical hernia. Soft. Nontender. Not distended. No guarding.   Male Genitourinary Note: No inguinal hernias. Normal external genitalia. Epididymi, testes, and spermatic cords normal without any masses.   Rectal Note: Perianal skin clear. There is subtle dimpling at the right anterior anal verge with a 1 cm cord that goes superficial in the sphincters to an anal crypt. Seems to correlate with the pictures and where he points for a chronic perirectal fistula. However there is no active abscess or drainage today.  No fissure. No external hemorrhoids. Normal sphincter tone. Tolerated digital and anoscopic exam. Grade 2 internal hemorrhoids left lateral and right posterior. Not actively bleeding. Prostate mildly enlarged but smooth. No discrete rectal masses.   Peripheral Vascular Upper Extremity Inspection - Left - No Cyanotic nailbeds - Left, Not Ischemic. Inspection - Right  - No Cyanotic nailbeds - Right, Not Ischemic.  Neurologic Neurologic evaluation reveals -normal attention span and ability to concentrate, able to name objects and repeat phrases. Appropriate fund of knowledge , normal sensation and normal coordination. Mental Status Affect - not angry, not paranoid. Cranial Nerves-Normal Bilaterally. Gait-Normal.  Neuropsychiatric Mental status exam performed with findings of-able to articulate well with normal speech/language, rate, volume and coherence, thought content normal with ability to perform basic computations and apply abstract reasoning and no evidence of hallucinations, delusions, obsessions or homicidal/suicidal ideation.  Musculoskeletal Global Assessment Spine, Ribs and Pelvis - no instability, subluxation or laxity. Right Upper Extremity - no instability, subluxation or laxity.  Lymphatic Head & Neck  General Head & Neck Lymphatics: Bilateral - Description - No Localized lymphadenopathy. Axillary  General Axillary Region: Bilateral - Description - No Localized lymphadenopathy. Femoral & Inguinal  Generalized Femoral & Inguinal Lymphatics: Left - Description - No Localized lymphadenopathy. Right - Description - No Localized lymphadenopathy.   Results Adin Hector MD; 06/03/2019 4:59 PM) Procedures  Name Value Date Hemorrhoids Procedure Other: Perianal skin clear. There is subtle dimpling at the right anterior anal verge with a 1 cm cord that goes superficial in the sphincters to an anal crypt. Seems to correlate with the pictures and where he points for a chronic perirectal fistula. However there is no active abscess or drainage today............Marland KitchenNo fissure. No external hemorrhoids. Normal sphincter tone. Tolerated digital and anoscopic exam. Grade 2 internal hemorrhoids left lateral and right posterior. Not actively bleeding. Prostate mildly enlarged but smooth. No discrete rectal masses.  Performed:  06/03/2019 3:58 PM    Assessment & Plan Adin Hector MD; 06/03/2019 4:59 PM) LIVER MASS, LEFT LOBE (R16.0) Impression: "1.6 cm ill-defined low-density lesion in the medial segment left liver, highly suspicious for metastatic disease. MRI abdomen with and without contrast would likely prove helpful to further evaluate" Current Plans He will have a new lesion in her liver. Radiology recommends MRI to help characterize this to see if it is benign  or metastatic disease. He may require liver biopsy.  Keep your appointment to see medical oncology Dr. Burr Medico.  She to have metastatic colon cancer, standard of care is to do chemotherapy to control this first before considering surgery.  He do have a story suspicious for a fistula. Hopefully it is just take abscess that is finally healed up. Take Augmentin antibiotic should you have a flareup until we can have a more definitive plan to manage your cancer.  MRI (eg, proton), abdomen; w/o contrast material(s), followed by w contrast material(s) and further sequences(74183)(ACR 0 )(DSN [id number])(G-Code G1004(MG)) - STAT (Clinical Scenarios: No content available for this procedure; ; Pt needs mri abdomen w/ w/o contrast to eval. 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 Mid transverse colon by CT scan. Biopsy consistent with adenocarcinoma.  Standard of care would be segmental colonic resection. Most likely would be an extended right hemicolectomy with ileal distal transverse anastomosis.  However the patient has a newly found liver lesion suspicious for metastatic disease. That needs to be worked up. If proven as a metastatic liver lesion, and standard care would be to do chemotherapy first.  Should he proceed with colectomy if the liver lesion is benign, would plan robotic right hemicolectomy with ileal transverse colon anastomosis.  I would  need cardiac clearance as the patient's a smoker followed by Dr. Percival Spanish. Had underwhelming stress test in 2017 but he still smokes and had coronary stent in 2010. His exercise tolerance seems to be pretty decent. Current Plans Pt Education - [facility] Colorectal Cancer (AT): discussed with patient and provided information. ANAL FISTULA (K60.3) Impression: Perirectal abscess drained in December with some intermittent drainage suspicious for fistula but not that impressive on exam today.  Quit smoking 10 minimize chance of recurrent infections.  Try and hold off on any surgical intervention on this at this time as he is asymptomatic and feeling better off antibiotics. Should he get some recurrent drainage or worsening symptoms, he may benefit from examination under anesthesia with probable fistulotomy versus lift repair if it is truly deeper. I think I would give him Augmentin antibiotics of refills for now until we can figure out what the course of his cancer treatment is. May be they would want fistulotomy before starting chemotherapy. If he has recurrent symptoms, it may come to that. If not, then hold off. Current Plans ANOSCOPY, DIAGNOSTIC (81856) Started Amoxicillin-Pot Clavulanate 875-125 MG Oral Tablet, 1 (one) Tablet two times daily, #10, 5 days starting 06/03/2019, Ref. x3. Pt Education - [facility] Abscess/Fistula ([place]): discussed with patient and provided information. Pt Education - [facility] Good Bowel Health ([place]) TOBACCO ABUSE (Z72.0) Impression: STOP SMOKING!  We talked to the patient about the dangers of smoking. We stressed that tobacco use dramatically increases the risk of peri-operative complications such as infection, tissue necrosis leaving to problems with incision/wound and organ healing, hernia, chronic pain, heart attack, stroke, DVT, pulmonary embolism, and death. We noted there are programs in our community to help stop smoking. Information was available. Current Plans Pt Education -  [facility] STOP SMOKING! PRESENCE OF STENT IN CORONARY ARTERY IN PATIENT WITH CORONARY ARTERY DISEASE (I25.10) Impression: History of coronary disease with stenting. No longer anticoagulated. He still smokes.  Should he need surgery, would like cardiac clearance. Most likely it would be okay since he has a decent exercise tolerance test in 2017. Agree with Dr. 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  We will tentatively schedule for robotic proximal hemicolectomy with ileal transverse intracorporeal anastomosis as long as there are no other concerns by medical oncology who is supposed to see him soon.

## 2019-06-06 ENCOUNTER — Other Ambulatory Visit: Payer: Self-pay

## 2019-06-06 ENCOUNTER — Inpatient Hospital Stay: Payer: Medicare Other | Attending: Hematology | Admitting: Hematology

## 2019-06-06 ENCOUNTER — Telehealth: Payer: Self-pay

## 2019-06-06 ENCOUNTER — Encounter: Payer: Self-pay | Admitting: Hematology

## 2019-06-06 VITALS — BP 143/81 | HR 77 | Temp 98.0°F | Resp 17 | Ht 64.0 in | Wt 195.9 lb

## 2019-06-06 DIAGNOSIS — Z79899 Other long term (current) drug therapy: Secondary | ICD-10-CM | POA: Diagnosis not present

## 2019-06-06 DIAGNOSIS — F17210 Nicotine dependence, cigarettes, uncomplicated: Secondary | ICD-10-CM

## 2019-06-06 DIAGNOSIS — M1990 Unspecified osteoarthritis, unspecified site: Secondary | ICD-10-CM | POA: Diagnosis not present

## 2019-06-06 DIAGNOSIS — Z833 Family history of diabetes mellitus: Secondary | ICD-10-CM | POA: Insufficient documentation

## 2019-06-06 DIAGNOSIS — I252 Old myocardial infarction: Secondary | ICD-10-CM | POA: Diagnosis not present

## 2019-06-06 DIAGNOSIS — I2510 Atherosclerotic heart disease of native coronary artery without angina pectoris: Secondary | ICD-10-CM | POA: Diagnosis not present

## 2019-06-06 DIAGNOSIS — E119 Type 2 diabetes mellitus without complications: Secondary | ICD-10-CM | POA: Diagnosis not present

## 2019-06-06 DIAGNOSIS — Z801 Family history of malignant neoplasm of trachea, bronchus and lung: Secondary | ICD-10-CM | POA: Diagnosis not present

## 2019-06-06 DIAGNOSIS — Z955 Presence of coronary angioplasty implant and graft: Secondary | ICD-10-CM

## 2019-06-06 DIAGNOSIS — K6289 Other specified diseases of anus and rectum: Secondary | ICD-10-CM | POA: Diagnosis not present

## 2019-06-06 DIAGNOSIS — C186 Malignant neoplasm of descending colon: Secondary | ICD-10-CM | POA: Diagnosis present

## 2019-06-06 DIAGNOSIS — K611 Rectal abscess: Secondary | ICD-10-CM | POA: Insufficient documentation

## 2019-06-06 DIAGNOSIS — K769 Liver disease, unspecified: Secondary | ICD-10-CM | POA: Insufficient documentation

## 2019-06-06 DIAGNOSIS — E785 Hyperlipidemia, unspecified: Secondary | ICD-10-CM | POA: Insufficient documentation

## 2019-06-06 DIAGNOSIS — Z7982 Long term (current) use of aspirin: Secondary | ICD-10-CM

## 2019-06-06 DIAGNOSIS — J449 Chronic obstructive pulmonary disease, unspecified: Secondary | ICD-10-CM | POA: Diagnosis not present

## 2019-06-06 DIAGNOSIS — I10 Essential (primary) hypertension: Secondary | ICD-10-CM | POA: Diagnosis not present

## 2019-06-06 DIAGNOSIS — Z7984 Long term (current) use of oral hypoglycemic drugs: Secondary | ICD-10-CM | POA: Diagnosis not present

## 2019-06-06 NOTE — Telephone Encounter (Signed)
   [facility] Pre-operative Risk Assessment    Request for surgical clearance:  1. What type of surgery is being performed? ROBOTIC EXTENDED PROXIMAL HEMICOLECTOMY W/POSSIBLE ANAL FISTULA REPAIR   2. When is this surgery scheduled? TBD-URGENT   3. What type of clearance is required (medical clearance vs. Pharmacy clearance to hold med vs. Both)? MEDICAL  4. Are there any medications that need to be held prior to surgery and how long?NONE   5. Practice name and name of physician performing surgery?  [facility] ATTN: ALISHA,CMA  6. What is your office phone number  [phone]    7.   What is your office fax number  [phone]  8.   Anesthesia type (None, local, MAC, general) ? GENERAL

## 2019-06-06 NOTE — Progress Notes (Deleted)
Cardiology Office Note   Date:  06/06/2019   ID:  [name] [name], DOB March 07, 1951, MRN [id number]  PCP:  Corine Shelter, PA-C  Cardiologist:  Dr.Hochrein  No chief complaint on file.    History of Present Illness: [name] [name] is a 69 y.o. male who presents for ongoing assessment and management of CAD, most recent cardiac catheterization on 04/2008 with RCA distal occlusion, and stenting with Xience DES. Luminal irregularities were found in the LAD and mid Cx 40-50%. He unfortunately continues to smoke.   He was last seen by Dr. Percival [name] on 12/31/2018 at which time he was asymptomatic but has sightly elevated BP. He did not have any new ischemic testing planned, and was to continue his current medication regimen. Labs to include lipid profile and Hgb A1c were ordered.   Labs revealed LDL of 67, TC of 126. Hgb A1c 5.7.   Since last office visit, [name] has been diagnosed with colorectal cancer, with perirectal abscess. Had colonoscopy with biopsy consistent with adenocarcinoma. He was also noted to have a liver mass highly suspicious for METS.  He is planned to have a colectomy on date to be determined with Dr. Johney [name]. He also saw Dr. Burr Medico, oncologist on 06/06/2019 who agreed that colectomy should be completed.     Past Medical History:  Diagnosis Date  . Arthritis   . COPD (chronic obstructive pulmonary disease) (HCC)    mild per pt.  . Coronary artery disease    cardiac catheterization on January 26,2010, right coronary arter distal occlusion with stenting of this with xience drug-eluting stent.  Pt also had liminal irregularities  in the LAD and 40-50% mid circumflex  stenosis  . Diabetes mellitus without complication ([place])   . Hyperlipidemia   . Hypertension   . Myocardial infarction ([place]) 2010  . Nephrolithiasis   . Ulcer     Past Surgical History:  Procedure Laterality Date  . COLONOSCOPY    . CORONARY ANGIOPLASTY WITH STENT PLACEMENT  2010  . POLYPECTOMY        Current Outpatient Medications  Medication Sig Dispense Refill  . aspirin EC 81 MG tablet Take 81 mg by mouth daily.    Marland Kitchen b complex vitamins tablet Take 1 tablet by mouth daily.    . B COMPLEX-C PO Take by mouth.    . cyanocobalamin 1000 MCG tablet Take 1,000 mcg by mouth daily.    Marland Kitchen lisinopril (PRINIVIL,ZESTRIL) 10 MG tablet TAKE 1 TABLET BY MOUTH EVERY DAY 30 tablet 3  . metFORMIN (GLUCOPHAGE) 500 MG tablet Take 1 tablet by mouth daily with breakfast.     . nitroGLYCERIN (NITROSTAT) 0.4 MG SL tablet Place 1 tablet (0.4 mg total) under the tongue every 5 (five) minutes as needed. 25 tablet 2  . pantoprazole (PROTONIX) 40 MG tablet Take 40 mg by mouth every other day.    . simvastatin (ZOCOR) 40 MG tablet TAKE 1 TABLET (40 MG TOTAL) BY MOUTH AT BEDTIME. 30 tablet 6   Current Facility-Administered Medications  Medication Dose Route Frequency Provider Last Rate Last Admin  . 0.9 %  sodium chloride infusion  500 mL Intravenous Once [name] Stabler, MD        Allergies:   Clopidogrel bisulfate    Social History:  The patient  reports that he has been smoking cigarettes. He has a 30.00 pack-year smoking history. He has quit using smokeless tobacco. He reports that he does not drink alcohol or use drugs.  Family History:  The patient's family history includes Colon cancer (age of onset: 38) in his mother; Diabetes in his mother; Lung cancer in his father.    ROS: All other systems are reviewed and negative. Unless otherwise mentioned in H&P    PHYSICAL EXAM: VS:  There were no vitals taken for this visit. , BMI There is no height or weight on file to calculate BMI. GEN: Well nourished, well developed, in no acute distress HEENT: normal Neck: no JVD, carotid bruits, or masses Cardiac: ***RRR; no murmurs, rubs, or gallops,no edema  Respiratory:  Clear to auscultation bilaterally, normal work of breathing GI: soft, nontender, nondistended, + BS MS: no deformity or  atrophy Skin: warm and dry, no rash Neuro:  Strength and sensation are intact Psych: euthymic mood, full affect   EKG:  EKG {ACTION; IS/IS IFO:[id number]} ordered today. The ekg ordered today demonstrates ***   Recent Labs: 05/22/2019: ALT 17; BUN 8; Creatinine, Ser 0.94; Hemoglobin 16.3; Platelets 142.0; Potassium 4.2; Sodium 140    Lipid Panel    Component Value Date/Time   CHOL 126 01/02/2019 0855   TRIG 134 01/02/2019 0855   HDL 35 (L) 01/02/2019 0855   CHOLHDL 3.6 01/02/2019 0855   CHOLHDL 3 09/16/2011 0839   VLDL 17.8 09/16/2011 0839   LDLCALC 67 01/02/2019 0855      Wt Readings from Last 3 Encounters:  06/06/19 195 lb 14.4 oz (88.9 kg)  05/22/19 199 lb (90.3 kg)  05/06/19 199 lb 9.6 oz (90.5 kg)      Other studies Reviewed: Additional studies/ records that were reviewed today include: ***. Review of the above records demonstrates: ***   ASSESSMENT AND PLAN:  1.  ***   Current medicines are reviewed at length with the patient today.  I have spent *** dedicated to the care of this patient on the date of this encounter to include pre-visit review of records, assessment, management and diagnostic testing,with shared decision making.  Labs/ tests ordered today include: *** Phill Myron. [place] Pugh, ANP, AACC   06/06/2019 11:26 AM    [facility] [address] Office [phone] Fax [phone]  Notice: This dictation was prepared with Dragon dictation along with smaller phrase technology. Any transcriptional errors that result from this process are unintentional and may not be corrected upon review.

## 2019-06-06 NOTE — Telephone Encounter (Signed)
Patient has followup with Jory Sims NP on 06/10/2019, will defer to [facility] for final clearance

## 2019-06-06 NOTE — Telephone Encounter (Signed)
Pt has appt with Jory Sims DNP 06/10/19. I will forward results to NP for upcoming appt. I will remove from the pre op call back pool.

## 2019-06-09 NOTE — Progress Notes (Signed)
Cardiology Clinic Note   Patient Name: [name] [name] Date of Encounter: 06/10/2019  Primary Care Provider:  Corine Shelter, PA-C Primary Cardiologist:  Minus Breeding, MD  Patient Profile    [name] [name] 69 year old male presents today for follow-up evaluation of his essential hypertension, CAD, hyperlipidemia, and preoperative evaluation.  Past Medical History    Past Medical History:  Diagnosis Date  . Arthritis   . COPD (chronic obstructive pulmonary disease) (HCC)    mild per pt.  . Coronary artery disease    cardiac catheterization on January 26,2010, right coronary arter distal occlusion with stenting of this with xience drug-eluting stent.  Pt also had liminal irregularities  in the LAD and 40-50% mid circumflex  stenosis  . Diabetes mellitus without complication ([place])   . Hyperlipidemia   . Hypertension   . Myocardial infarction ([place]) 2010  . Nephrolithiasis   . Ulcer    Past Surgical History:  Procedure Laterality Date  . COLONOSCOPY    . CORONARY ANGIOPLASTY WITH STENT PLACEMENT  2010  . POLYPECTOMY      Allergies  Allergies  Allergen Reactions  . Clopidogrel Bisulfate Hives    Plavix="hives"    History of Present Illness    [name] [name] has a PMH of essential hypertension, CAD, fatty liver disease, colon cancer, hypercholesterolemia, dermatitis, cough, and perirectal abscess.  He underwent an exercise tolerance test in 2017 which was negative.  He was seen last by Dr. Percival Spanish on 12/31/2018.  During that time he was doing well.  He was not following a heart healthy diet at that time.  He continued to smoke cigarettes.  He was physically active playing golf.  He denied chest discomfort, neck discomfort, and arm discomfort as well as new shortness of breath PND and orthopnea.  He also denied palpitations presyncope and syncope at that time.  He presents to the clinic today for follow-up evaluation and preop cardiac evaluation.  He states he has been  doing well.  He continues to regularly play golf.  He states he continues to smoke 1 pack/day.  He states that he feels like his blood pressure is elevated today due to the a.m. that he ate for her daughter's birthday party over the weekend.  He states he also had a ham with breakfast this morning.  His blood pressure slightly elevated today.  I will give him a blood pressure log, clear him for surgery today, give him smoking cessation information and have him follow-up with Dr. Percival Spanish as scheduled.  He denies chest pain, shortness of breath, lower extremity edema, fatigue, palpitations, melena, hematuria, hemoptysis, diaphoresis, weakness, presyncope, syncope, orthopnea, and PND.   Home Medications    Prior to Admission medications   Medication Sig Start Date End Date Taking? Authorizing Provider  aspirin EC 81 MG tablet Take 81 mg by mouth daily.    [provider]  b complex vitamins tablet Take 1 tablet by mouth daily.    [provider]  B COMPLEX-C PO Take by mouth.    [provider]  cyanocobalamin 1000 MCG tablet Take 1,000 mcg by mouth daily.    [provider]  lisinopril (PRINIVIL,ZESTRIL) 10 MG tablet TAKE 1 TABLET BY MOUTH EVERY DAY 08/21/13   Minus Breeding, MD  metFORMIN (GLUCOPHAGE) 500 MG tablet Take 1 tablet by mouth daily with breakfast.  10/26/15   [provider]  nitroGLYCERIN (NITROSTAT) 0.4 MG SL tablet Place 1 tablet (0.4 mg total) under the tongue every  5 (five) minutes as needed. 12/16/16   Minus Breeding, MD  pantoprazole (PROTONIX) 40 MG tablet Take 40 mg by mouth every other day.    [provider]  simvastatin (ZOCOR) 40 MG tablet TAKE 1 TABLET (40 MG TOTAL) BY MOUTH AT BEDTIME. 02/22/13   Minus Breeding, MD    Family History    Family History  Problem Relation Age of Onset  . Colon cancer Mother 82  . Diabetes Mother   . Lung cancer Father   . Colon polyps Neg Hx   . Esophageal cancer Neg Hx   .  Rectal cancer Neg Hx   . Stomach cancer Neg Hx    He indicated that his mother is alive. He indicated that his father is deceased. He indicated that all of his three sisters are alive. He indicated that his brother is alive. He indicated that his maternal grandmother is deceased. He indicated that his maternal grandfather is deceased. He indicated that his paternal grandmother is deceased. He indicated that his paternal grandfather is deceased. He indicated that both of his daughters are alive. He indicated that his son is alive. He indicated that the status of his neg hx is unknown.  Social History    Social History   Socioeconomic History  . Marital status: Married    Spouse name: Not on file  . Number of children: 3  . Years of education: Not on file  . Highest education level: Not on file  Occupational History  . Occupation: Carpenter  Tobacco Use  . Smoking status: Current Every Day Smoker    Packs/day: 1.00    Years: 30.00    Pack years: 30.00    Types: Cigarettes  . Smokeless tobacco: Former Network engineer and Sexual Activity  . Alcohol use: No  . Drug use: No  . Sexual activity: Not on file  Other Topics Concern  . Not on file  Social History Narrative  . Not on file   Social Determinants of Health   Financial Resource Strain:   . Difficulty of Paying Living Expenses: Not on file  Food Insecurity:   . Worried About Charity fundraiser in the Last Year: Not on file  . Ran Out of Food in the Last Year: Not on file  Transportation Needs:   . Lack of Transportation (Medical): Not on file  . Lack of Transportation (Non-Medical): Not on file  Physical Activity:   . Days of Exercise per Week: Not on file  . Minutes of Exercise per Session: Not on file  Stress:   . Feeling of Stress : Not on file  Social Connections:   . Frequency of Communication with Friends and Family: Not on file  . Frequency of Social Gatherings with Friends and Family: Not on file  . Attends  Religious Services: Not on file  . Active Member of Clubs or Organizations: Not on file  . Attends Archivist Meetings: Not on file  . Marital Status: Not on file  Intimate Partner Violence:   . Fear of Current or Ex-Partner: Not on file  . Emotionally Abused: Not on file  . Physically Abused: Not on file  . Sexually Abused: Not on file     Review of Systems    General:  No chills, fever, night sweats or weight changes.  Cardiovascular:  No chest pain, dyspnea on exertion, edema, orthopnea, palpitations, paroxysmal nocturnal dyspnea. Dermatological: No rash, lesions/masses Respiratory: No cough, dyspnea Urologic: No hematuria, dysuria  Abdominal:   No nausea, vomiting, diarrhea, bright red blood per rectum, melena, or hematemesis Neurologic:  No visual changes, wkns, changes in mental status. All other systems reviewed and are otherwise negative except as noted above.  Physical Exam    VS:  BP (!) 142/76   Pulse 73   Ht 5' 4"  (1.626 m)   Wt 195 lb 3.2 oz (88.5 kg)   BMI 33.51 kg/m  , BMI Body mass index is 33.51 kg/m. GEN: Well nourished, well developed, in no acute distress. HEENT: normal. Neck: Supple, no JVD, carotid bruits, or masses. Cardiac: RRR, no murmurs, rubs, or gallops. No clubbing, cyanosis, edema.  Radials/DP/PT 2+ and equal bilaterally.  Respiratory:  Respirations regular and unlabored, clear to auscultation bilaterally. GI: Soft, nontender, nondistended, BS + x 4. MS: no deformity or atrophy. Skin: warm and dry, no rash. Neuro:  Strength and sensation are intact. Psych: Normal affect.  Accessory Clinical Findings    ECG personally reviewed by me today- normal sinus rhythm no ST or T wave deviation 70bpm  EKG normal sinus rhythm 65 bpm 12/29/2018  ETT 11/27/2015   Blood pressure demonstrated a hypertensive response to exercise.  There was no ST segment deviation noted during stress.  No T wave inversion was noted during stress.   No  evidence of ischemia on stress ECG. PVCs are present at baseline and do not appear to change in frequency during exercise or recovery.  Assessment & Plan   1.  Coronary artery disease-no chest pain today.  EKG today shows normal sinus rhythm 70 bpm Continue aspirin 81 mg tablet daily Continue lisinopril 10 mg tablet daily Simvastatin 40 mg tablet daily Heart healthy low-sodium diet-salty 6 given Nitroglycerin 0.4 mg sublingual tablet as needed Increase physical activity as tolerated  Essential hypertension-BP today 142/76.  Well-controlled at home. Continue  lisinopril 10 mg tablet daily Heart healthy low-sodium diet Increase physical activity as tolerated Blood pressure log given  Hyperlipidemia-LDL 67 01/02/2019 Continue simvastatin 40 mg tablet daily Heart healthy low-sodium high-fiber diet Increase physical activity as tolerated  Smoking cessation-continues to smoke 1 pack/day. Tobacco cessation information given  Preoperative cardiac evaluation-  Robotic  extended paroxysmal hemicolectomy with possible anal fissure repair.  [place] surgery     Primary Cardiologist: Minus Breeding, MD  Chart reviewed as part of pre-operative protocol coverage. Given past medical history and time since last visit, based on ACC/AHA guidelines, [name] [name] would be at acceptable risk for the planned procedure without further cardiovascular testing.   His RCRI is class III, 6.6% risk of major cardiac event.  He is able to complete more than 4 METS of physical activity.  I will route this recommendation to the requesting party via Epic fax function and remove from pre-op pool.  Please call with questions.    Jossie Ng. [facility] [place] [address] Office [phone] Fax [phone]

## 2019-06-10 ENCOUNTER — Other Ambulatory Visit: Payer: Self-pay

## 2019-06-10 ENCOUNTER — Ambulatory Visit (INDEPENDENT_AMBULATORY_CARE_PROVIDER_SITE_OTHER): Payer: Medicare Other | Admitting: General Practice

## 2019-06-10 ENCOUNTER — Encounter: Payer: Self-pay | Admitting: General Practice

## 2019-06-10 ENCOUNTER — Ambulatory Visit: Payer: Medicare Other | Admitting: Adult Health

## 2019-06-10 VITALS — BP 142/76 | HR 73 | Ht 64.0 in | Wt 195.2 lb

## 2019-06-10 DIAGNOSIS — Z01818 Encounter for other preprocedural examination: Secondary | ICD-10-CM | POA: Diagnosis not present

## 2019-06-10 DIAGNOSIS — I2510 Atherosclerotic heart disease of native coronary artery without angina pectoris: Secondary | ICD-10-CM

## 2019-06-10 DIAGNOSIS — E785 Hyperlipidemia, unspecified: Secondary | ICD-10-CM | POA: Diagnosis not present

## 2019-06-10 DIAGNOSIS — I10 Essential (primary) hypertension: Secondary | ICD-10-CM | POA: Diagnosis not present

## 2019-06-10 DIAGNOSIS — Z720 Tobacco use: Secondary | ICD-10-CM

## 2019-06-10 NOTE — Patient Instructions (Signed)
Medication Instructions:  The current medical regimen is effective;  continue present plan and medications as directed. Please refer to the Current Medication list given to you today. If you need a refill on your cardiac medications before your next appointment, please call your pharmacy.  Special Instructions: CLEARED FOR SURGERY  TAKE AND LOG YOUR BLOOD PRESSURE AND BRING BACK WITH YOU TO FOLLOW UP APPOINTMENT.  Follow-Up: 6 months Please call our office 2 months in advance, July 2021 to schedule this SEPT 2021 appointment. In Person Minus Breeding, MD.    At [facility], you and your health needs are our priority.  As part of our continuing mission to provide you with exceptional heart care, we have created designated Provider Care Teams.  These Care Teams include your primary Cardiologist (physician) and Advanced Practice Providers (APPs -  Physician Assistants and Nurse Practitioners) who all work together to provide you with the care you need, when you need it.  Reduce your risk of getting COVID-19 With your heart disease it is especially important for people at increased risk of severe illness from COVID-19, and those who live with them, to protect themselves from getting COVID-19. The best way to protect yourself and to help reduce the spread of the virus that causes COVID-19 is to: Marland Kitchen Limit your interactions with other people as much as possible. . Take COVID-19 when you do interact with others. If you start feeling sick and think you may have COVID-19, get in touch with your healthcare provider within 24 hours.  Thank you for choosing [facility] at [facility]!!

## 2019-06-10 NOTE — Addendum Note (Signed)
Addended by: Waylan Rocher on: 06/10/2019 12:58 PM   Modules accepted: Orders

## 2019-06-12 ENCOUNTER — Other Ambulatory Visit: Payer: Self-pay

## 2019-06-26 ENCOUNTER — Telehealth: Payer: Self-pay | Admitting: *Deleted

## 2019-06-26 NOTE — Telephone Encounter (Signed)
The patient was seen by Dr. Johney [place] on 06/03/19, surgery on 08/01/19, scheduled for post-op 08/19/19.

## 2019-07-24 ENCOUNTER — Ambulatory Visit: Payer: Self-pay | Admitting: Surgery

## 2019-07-24 ENCOUNTER — Encounter (HOSPITAL_COMMUNITY): Payer: Self-pay

## 2019-07-24 NOTE — Patient Instructions (Signed)
DUE TO COVID-19 ONLY TWO VISITORS ARE ALLOWED TO COME WITH YOU AND STAY IN THE WAITING ROOM ONLY DURING PRE OP AND PROCEDURE. THE TWO VISITORS MAY VISIT WITH YOU IN YOUR PRIVATE ROOM DURING VISITING HOURS ONLY!!   COVID SWAB TESTING MUST BE COMPLETED ON:  Monday, July 29, 2019 at 1:15 PM    [address], [place] [facility] enter pre surgical testing line (Must self quarantine after testing. Follow instructions on handout.)             Your procedure is scheduled on: Thursday, August 01, 2019   Report to [facility] Main  Entrance    Report to admitting at 10:45 AM   Call this number if you have problems the morning of surgery [phone]   Drink plenty of fluids the day of pre-op to prevent dehydration   Dulcolax: take the day before surgery   Miralax:  Mix with 64 oz Gatorade/Powerade. Drink gradually over the next few hours (8 oz glass every 15-30 minutes) until gone the day prior to surgery.    Neomycin and Metronidazole: At 2 pm, 3 pm and 10 pm after Miralax bowel prep the day prior to surgery.   Drink 2 Ensure drinks the night before surgery.     Do not eat food :After Midnight.   May have liquids until 9:45 AM day of surgery   CLEAR LIQUID DIET  Foods Allowed                                                                     Foods Excluded  Water, Black Coffee and tea, regular and decaf                             liquids that you cannot  Plain Jell-O in any flavor  (No red)                                           see through such as: Fruit ices (not with fruit pulp)                                     milk, soups, orange juice  Iced Popsicles (No red)                                    All solid food Carbonated beverages, regular and diet                                    Apple juices Sports drinks like Gatorade (No red) Lightly seasoned clear broth or consume(fat free) Sugar, honey syrup  Sample Menu Breakfast                                 Lunch  Supper Cranberry juice                    Beef broth                            Chicken broth Jell-O                                     Grape juice                           Apple juice Coffee or tea                        Jell-O                                      Popsicle                                                Coffee or tea                        Coffee or tea   Complete one Ensure drink the morning of surgery at 9:45 AM  the day of surgery.   Oral Hygiene is also important to reduce your risk of infection.                                    Remember - BRUSH YOUR TEETH THE MORNING OF SURGERY WITH YOUR REGULAR TOOTHPASTE   Do NOT smoke after Midnight   Take these medicines the morning of surgery with A SIP OF WATER: None  DO NOT TAKE ANY ORAL DIABETIC MEDICATIONS DAY OF YOUR SURGERY                               You may not have any metal on your body including jewelry, and body piercings             Do not wear lotions, powders, perfumes/cologne, or deodorant                           Men may shave face and neck.   Do not bring valuables to the hospital. [place].   Contacts, dentures or bridgework may not be worn into surgery.   Bring small overnight bag day of surgery.    Special Instructions: Bring a copy of your healthcare power of attorney and living will documents         the day of surgery if you haven't scanned them in before.              Please read over the following fact sheets you were given: IF [facility] - Preparing for  Surgery Before surgery, you can play an important role.  Because skin is not sterile, your skin needs to be as free of germs as possible.  You can reduce the number of germs on your skin by washing with CHG (chlorahexidine gluconate) soap before surgery.   CHG is an antiseptic cleaner which kills germs and bonds with the skin to continue killing germs even after washing. Please DO NOT use if you have an allergy to CHG or antibacterial soaps.  If your skin becomes reddened/irritated stop using the CHG and inform your nurse when you arrive at Short Stay. Do not shave (including legs and underarms) for at least 48 hours prior to the first CHG shower.  You may shave your face/neck.  Please follow these instructions carefully:  1.  Shower with CHG Soap the night before surgery and the  morning of surgery.  2.  If you choose to wash your hair, wash your hair first as usual with your normal  shampoo.  3.  After you shampoo, rinse your hair and body thoroughly to remove the shampoo.                             4.  Use CHG as you would any other liquid soap.  You can apply chg directly to the skin and wash.  Gently with a scrungie or clean washcloth.  5.  Apply the CHG Soap to your body ONLY FROM THE NECK DOWN.   Do   not use on face/ open                           Wound or open sores. Avoid contact with eyes, ears mouth and   genitals (private parts).                       Wash face,  Genitals (private parts) with your normal soap.             6.  Wash thoroughly, paying special attention to the area where your    surgery  will be performed.  7.  Thoroughly rinse your body with warm water from the neck down.  8.  DO NOT shower/wash with your normal soap after using and rinsing off the CHG Soap.                9.  Pat yourself dry with a clean towel.            10.  Wear clean pajamas.            11.  Place clean sheets on your bed the night of your first shower and do not  sleep with pets. Day of Surgery : Do not apply any lotions/deodorants the morning of surgery.  Please wear clean clothes to the hospital/surgery center.  FAILURE TO FOLLOW THESE INSTRUCTIONS MAY RESULT IN THE CANCELLATION OF YOUR SURGERY  PATIENT  SIGNATURE_________________________________  NURSE SIGNATURE__________________________________  ________________________________________________________________________   [name] [name]  An incentive spirometer is a tool that can help keep your lungs clear and active. This tool measures how well you are filling your lungs with each breath. Taking long deep breaths may help reverse or decrease the chance of developing breathing (pulmonary) problems (especially infection) following:  A long period of time when you are unable to move or be active. BEFORE THE PROCEDURE   If the spirometer includes  an indicator to show your best effort, your nurse or respiratory therapist will set it to a desired goal.  If possible, sit up straight or lean slightly forward. Try not to slouch.  Hold the incentive spirometer in an upright position. INSTRUCTIONS FOR USE  1. Sit on the edge of your bed if possible, or sit up as far as you can in bed or on a chair. 2. Hold the incentive spirometer in an upright position. 3. Breathe out normally. 4. Place the mouthpiece in your mouth and seal your lips tightly around it. 5. Breathe in slowly and as deeply as possible, raising the piston or the ball toward the top of the column. 6. Hold your breath for 3-5 seconds or for as long as possible. Allow the piston or ball to fall to the bottom of the column. 7. Remove the mouthpiece from your mouth and breathe out normally. 8. Rest for a few seconds and repeat Steps 1 through 7 at least 10 times every 1-2 hours when you are awake. Take your time and take a few normal breaths between deep breaths. 9. The spirometer may include an indicator to show your best effort. Use the indicator as a goal to work toward during each repetition. 10. After each set of 10 deep breaths, practice coughing to be sure your lungs are clear. If you have an incision (the cut made at the time of surgery), support your incision when coughing  by placing a pillow or rolled up towels firmly against it. Once you are able to get out of bed, walk around indoors and cough well. You may stop using the incentive spirometer when instructed by your caregiver.  RISKS AND COMPLICATIONS  Take your time so you do not get dizzy or light-headed.  If you are in pain, you may need to take or ask for pain medication before doing incentive spirometry. It is harder to take a deep breath if you are having pain. AFTER USE  Rest and breathe slowly and easily.  It can be helpful to keep track of a log of your progress. Your caregiver can provide you with a simple table to help with this. If you are using the spirometer at home, follow these instructions: [place] IF:   You are having difficultly using the spirometer.  You have trouble using the spirometer as often as instructed.  Your pain medication is not giving enough relief while using the spirometer.  You develop fever of 100.5 F (38.1 C) or higher. SEEK IMMEDIATE MEDICAL CARE IF:   You cough up bloody sputum that had not been present before.  You develop fever of 102 F (38.9 C) or greater.  You develop worsening pain at or near the incision site. MAKE SURE YOU:   Understand these instructions.  Will watch your condition.  Will get help right away if you are not doing well or get worse. Document Released: 08/01/2006 Document Revised: 06/13/2011 Document Reviewed: 10/02/2006 [facility] Patient Information 2014 [place], [place].   ________________________________________________________________________

## 2019-07-25 ENCOUNTER — Other Ambulatory Visit: Payer: Self-pay

## 2019-07-25 ENCOUNTER — Encounter (HOSPITAL_COMMUNITY)
Admission: RE | Admit: 2019-07-25 | Discharge: 2019-07-25 | Disposition: A | Discharge status: Home / Self Care | Payer: Medicare Other | Source: Ambulatory Visit | Attending: Surgery | Admitting: Surgery

## 2019-07-25 ENCOUNTER — Encounter (HOSPITAL_COMMUNITY): Payer: Self-pay

## 2019-07-25 DIAGNOSIS — Z01812 Encounter for preprocedural laboratory examination: Secondary | ICD-10-CM | POA: Insufficient documentation

## 2019-07-25 DIAGNOSIS — E119 Type 2 diabetes mellitus without complications: Secondary | ICD-10-CM | POA: Insufficient documentation

## 2019-07-25 HISTORY — DX: Personal history of urinary calculi: Z87.442

## 2019-07-25 HISTORY — DX: Malignant neoplasm of colon, unspecified: C18.9

## 2019-07-25 HISTORY — DX: Personal history of colon polyps, unspecified: Z86.0100

## 2019-07-25 HISTORY — DX: Gastric ulcer, unspecified as acute or chronic, without hemorrhage or perforation: K25.9

## 2019-07-25 HISTORY — DX: Rectal abscess: K61.1

## 2019-07-25 HISTORY — DX: Personal history of colonic polyps: Z86.010

## 2019-07-25 HISTORY — DX: Gastro-esophageal reflux disease without esophagitis: K21.9

## 2019-07-25 NOTE — Progress Notes (Signed)
PCP - Dr. Sheryn Bison Cardiologist - Dr. Vita Barley J. Cleaver NP clearance in epic 06/10/19  Chest x-ray - N/A EKG - 06/10/19 in epic Stress Test - greater than 2 years ECHO - N/A Cardiac Cath - greater than 2 years  Sleep Study - N/A CPAP - N/A  Fasting Blood Sugar - does not check at home Checks Blood Sugar __N/A___ times a day Hgb A1c 5.9 07/19/19 labs on chart  Blood Thinner Instructions: N/A Aspirin Instructions: no instructions given Last Dose: currently taking  Anesthesia review: MI, CAD,  COPD, HTN, DM  Patient denies shortness of breath, fever, cough and chest pain at PAT appointment   Patient verbalized understanding of instructions that were given to them at the PAT appointment. Patient was also instructed that they will need to review over the PAT instructions again at home before surgery.

## 2019-07-26 ENCOUNTER — Encounter (HOSPITAL_COMMUNITY)
Admission: RE | Admit: 2019-07-26 | Discharge: 2019-07-26 | Disposition: A | Discharge status: Home / Self Care | Payer: Medicare Other | Source: Ambulatory Visit | Attending: Surgery | Admitting: Surgery

## 2019-07-26 DIAGNOSIS — E119 Type 2 diabetes mellitus without complications: Secondary | ICD-10-CM | POA: Diagnosis not present

## 2019-07-26 DIAGNOSIS — Z01812 Encounter for preprocedural laboratory examination: Secondary | ICD-10-CM | POA: Diagnosis not present

## 2019-07-26 LAB — BASIC METABOLIC PANEL
Anion gap: 8 (ref 5–15)
BUN: 10 mg/dL (ref 8–23)
CO2: 28 mmol/L (ref 22–32)
Calcium: 9 mg/dL (ref 8.9–10.3)
Chloride: 110 mmol/L (ref 98–111)
Creatinine, Ser: 0.81 mg/dL (ref 0.61–1.24)
GFR calc Af Amer: >60 mL/min (ref >60)
GFR calc non Af Amer: >60 mL/min (ref >60)
Glucose, Bld: 126 mg/dL — ABNORMAL HIGH (ref 70–99)
Potassium: 4.3 mmol/L (ref 3.5–5.1)
Sodium: 146 mmol/L — ABNORMAL HIGH (ref 135–145)

## 2019-07-26 LAB — HEMOGLOBIN A1C
Hgb A1c MFr Bld: 6 % — ABNORMAL HIGH (ref 4.8–5.6)
Mean Plasma Glucose: 125.5 mg/dL

## 2019-07-26 LAB — CBC
HCT: 47 % (ref 39.0–52.0)
Hemoglobin: 15.8 g/dL (ref 13.0–17.0)
MCH: 33.5 pg (ref 26.0–34.0)
MCHC: 33.6 g/dL (ref 30.0–36.0)
MCV: 99.8 fL (ref 80.0–100.0)
Platelets: 140 10*3/uL — ABNORMAL LOW (ref 150–400)
RBC: 4.71 MIL/uL (ref 4.22–5.81)
RDW: 13.2 % (ref 11.5–15.5)
WBC: 7.3 10*3/uL (ref 4.0–10.5)
nRBC: 0 % (ref 0.0–0.2)

## 2019-07-26 LAB — GLUCOSE, CAPILLARY: Glucose-Capillary: 126 mg/dL — ABNORMAL HIGH (ref 70–99)

## 2019-07-29 ENCOUNTER — Other Ambulatory Visit (HOSPITAL_COMMUNITY)
Admission: RE | Admit: 2019-07-29 | Discharge: 2019-07-29 | Disposition: A | Discharge status: Home / Self Care | Payer: Medicare Other | Source: Ambulatory Visit | Attending: Surgery | Admitting: Surgery

## 2019-07-29 DIAGNOSIS — Z01812 Encounter for preprocedural laboratory examination: Secondary | ICD-10-CM | POA: Insufficient documentation

## 2019-07-29 DIAGNOSIS — Z20822 Contact with and (suspected) exposure to covid-19: Secondary | ICD-10-CM | POA: Insufficient documentation

## 2019-07-29 LAB — SARS CORONAVIRUS 2 (TAT 6-24 HRS): SARS Coronavirus 2: NEGATIVE

## 2019-07-29 NOTE — Progress Notes (Signed)
Anesthesia Chart Review   Case: [id number] Date/Time: 08/01/19 1239   Procedure: XI ROBOT EXTENDED PROXIMAL HEMICOLECTOMY (N/A )   Anesthesia type: General   Pre-op diagnosis: CANCER OF TRANSVERSE COLON, POSSIBLE ANAL FISTULA   Location: [facility] ROOM 02 / [facility]   Surgeons: Michael Boston, MD      DISCUSSION:69 y.o. current every day smoker (55 pack years) with h/o HTN, CAD (DES 2010), HLD, COPD, DM II, GERD, colon cancer, possible anal fissure scheduled for above procedure 08/01/2019 with Dr. Michael Boston.   Pt last seen by cardiology for preoperative evaluation 06/10/2019.  Per OV note, "Chart reviewed as part of pre-operative protocol coverage. Given past medical history and time since last visit, based on ACC/AHA guidelines, [name] [name] would be at acceptable risk for the planned procedure without further cardiovascular testing.  His RCRI is class III, 6.6% risk of major cardiac event.  He is able to complete more than 4 METS of physical activity."  Anticipate pt can proceed with planned procedure barring acute status change.   VS: BP (!) 151/78 (BP Location: Right Arm)   Pulse 69   Temp 37.2 C (Oral)   Resp 18   Ht 5' 4"  (1.626 m)   Wt 88.6 kg   SpO2 100%   BMI 33.51 kg/m   PROVIDERS: Corine Shelter, PA-C is PCP   Minus Breeding, MD is Cardiologist  LABS: Labs reviewed: Acceptable for surgery. (all labs ordered are listed, but only abnormal results are displayed)  Labs Reviewed  BASIC METABOLIC PANEL - Abnormal; Notable for the following components:      Result Value   Sodium 146 (*)    Glucose, Bld 126 (*)    All other components within normal limits  CBC - Abnormal; Notable for the following components:   Platelets 140 (*)    All other components within normal limits  HEMOGLOBIN A1C - Abnormal; Notable for the following components:   Hgb A1c MFr Bld 6.0 (*)    All other components within normal limits  GLUCOSE, CAPILLARY - Abnormal; Notable for the following  components:   Glucose-Capillary 126 (*)    All other components within normal limits     IMAGES:   EKG: 06/10/2019 Rate 70 bpm  Normal sinus rhythm   CV: ETT 11/27/2015  Blood pressure demonstrated a hypertensive response to exercise.  There was no ST segment deviation noted during stress.  No T wave inversion was noted during stress.   No evidence of ischemia on stress ECG. PVCs are present at baseline and do not appear to change in frequency during exercise or recovery. Past Medical History:  Diagnosis Date  . Arthritis   . Colon cancer ([place])   . COPD (chronic obstructive pulmonary disease) (HCC)    mild per pt.  . Coronary artery disease    cardiac catheterization on January 26,2010, right coronary arter distal occlusion with stenting of this with xience drug-eluting stent.  Pt also had liminal irregularities  in the LAD and 40-50% mid circumflex  stenosis  . Diabetes mellitus without complication ([place])   . GERD (gastroesophageal reflux disease)   . History of colon polyps   . History of kidney stones   . Hyperlipidemia   . Hypertension   . Myocardial infarction ([place]) 2010  . Perirectal abscess   . Pneumonia 2013  . Stomach ulcer 1970's    Past Surgical History:  Procedure Laterality Date  . COLONOSCOPY    . CORONARY ANGIOPLASTY WITH STENT PLACEMENT  2010  . POLYPECTOMY      MEDICATIONS: . aspirin EC 81 MG tablet  . b complex vitamins tablet  . lisinopril (PRINIVIL,ZESTRIL) 10 MG tablet  . metFORMIN (GLUCOPHAGE) 500 MG tablet  . metroNIDAZOLE (FLAGYL) 500 MG tablet  . naproxen sodium (ALEVE) 220 MG tablet  . neomycin (MYCIFRADIN) 500 MG tablet  . nitroGLYCERIN (NITROSTAT) 0.4 MG SL tablet  . pantoprazole (PROTONIX) 40 MG tablet  . simvastatin (ZOCOR) 40 MG tablet  . SUPREP BOWEL PREP KIT 17.5-3.13-1.6 GM/177ML SOLN  . vitamin B-12 (CYANOCOBALAMIN) 1000 MCG tablet   . 0.9 %  sodium chloride infusion     Maia Plan [facility] Pre-Surgical  Testing [phone] 07/29/19  12:07 PM

## 2019-07-31 MED ORDER — BUPIVACAINE LIPOSOME 1.3 % IJ SUSP
20.0000 mL | Freq: Once | INTRAMUSCULAR | Status: DC
  Filled 2019-07-31: qty 20

## 2019-08-01 ENCOUNTER — Inpatient Hospital Stay (HOSPITAL_COMMUNITY): Payer: Medicare Other | Admitting: Certified Registered Nurse Anesthetist

## 2019-08-01 ENCOUNTER — Encounter (HOSPITAL_COMMUNITY): Payer: Self-pay | Admitting: Surgery

## 2019-08-01 ENCOUNTER — Encounter (HOSPITAL_COMMUNITY)
Admission: RE | Disposition: A | Discharge status: Home / Self Care | Payer: Self-pay | Source: Ambulatory Visit | Attending: Surgery

## 2019-08-01 ENCOUNTER — Inpatient Hospital Stay (HOSPITAL_COMMUNITY)
Admission: RE | Admit: 2019-08-01 | Discharge: 2019-08-04 | DRG: 331 | Disposition: A | Discharge status: Home / Self Care | Payer: Medicare Other | Source: Ambulatory Visit | Attending: Surgery | Admitting: Surgery

## 2019-08-01 ENCOUNTER — Inpatient Hospital Stay (HOSPITAL_COMMUNITY): Payer: Medicare Other | Admitting: Physician Assistant

## 2019-08-01 DIAGNOSIS — K642 Third degree hemorrhoids: Secondary | ICD-10-CM

## 2019-08-01 DIAGNOSIS — E119 Type 2 diabetes mellitus without complications: Secondary | ICD-10-CM | POA: Diagnosis present

## 2019-08-01 DIAGNOSIS — Z955 Presence of coronary angioplasty implant and graft: Secondary | ICD-10-CM | POA: Diagnosis not present

## 2019-08-01 DIAGNOSIS — K644 Residual hemorrhoidal skin tags: Secondary | ICD-10-CM | POA: Diagnosis present

## 2019-08-01 DIAGNOSIS — Z800 Family history of malignant neoplasm of digestive organs: Secondary | ICD-10-CM | POA: Diagnosis not present

## 2019-08-01 DIAGNOSIS — E7800 Pure hypercholesterolemia, unspecified: Secondary | ICD-10-CM | POA: Diagnosis present

## 2019-08-01 DIAGNOSIS — J449 Chronic obstructive pulmonary disease, unspecified: Secondary | ICD-10-CM | POA: Diagnosis present

## 2019-08-01 DIAGNOSIS — Z720 Tobacco use: Secondary | ICD-10-CM | POA: Diagnosis present

## 2019-08-01 DIAGNOSIS — Z7982 Long term (current) use of aspirin: Secondary | ICD-10-CM | POA: Diagnosis not present

## 2019-08-01 DIAGNOSIS — I252 Old myocardial infarction: Secondary | ICD-10-CM

## 2019-08-01 DIAGNOSIS — E785 Hyperlipidemia, unspecified: Secondary | ICD-10-CM | POA: Diagnosis present

## 2019-08-01 DIAGNOSIS — K604 Rectal fistula: Secondary | ICD-10-CM | POA: Diagnosis present

## 2019-08-01 DIAGNOSIS — K7581 Nonalcoholic steatohepatitis (NASH): Secondary | ICD-10-CM | POA: Diagnosis present

## 2019-08-01 DIAGNOSIS — F17210 Nicotine dependence, cigarettes, uncomplicated: Secondary | ICD-10-CM | POA: Diagnosis present

## 2019-08-01 DIAGNOSIS — Z833 Family history of diabetes mellitus: Secondary | ICD-10-CM | POA: Diagnosis not present

## 2019-08-01 DIAGNOSIS — Z888 Allergy status to other drugs, medicaments and biological substances status: Secondary | ICD-10-CM

## 2019-08-01 DIAGNOSIS — K603 Anal fistula: Secondary | ICD-10-CM | POA: Diagnosis present

## 2019-08-01 DIAGNOSIS — I2510 Atherosclerotic heart disease of native coronary artery without angina pectoris: Secondary | ICD-10-CM | POA: Diagnosis present

## 2019-08-01 DIAGNOSIS — Z20822 Contact with and (suspected) exposure to covid-19: Secondary | ICD-10-CM | POA: Diagnosis present

## 2019-08-01 DIAGNOSIS — I10 Essential (primary) hypertension: Secondary | ICD-10-CM | POA: Diagnosis present

## 2019-08-01 DIAGNOSIS — Z7984 Long term (current) use of oral hypoglycemic drugs: Secondary | ICD-10-CM

## 2019-08-01 DIAGNOSIS — M1990 Unspecified osteoarthritis, unspecified site: Secondary | ICD-10-CM | POA: Diagnosis present

## 2019-08-01 DIAGNOSIS — K620 Anal polyp: Secondary | ICD-10-CM | POA: Diagnosis present

## 2019-08-01 DIAGNOSIS — Z79899 Other long term (current) drug therapy: Secondary | ICD-10-CM

## 2019-08-01 DIAGNOSIS — C184 Malignant neoplasm of transverse colon: Principal | ICD-10-CM | POA: Diagnosis present

## 2019-08-01 DIAGNOSIS — K219 Gastro-esophageal reflux disease without esophagitis: Secondary | ICD-10-CM | POA: Diagnosis present

## 2019-08-01 HISTORY — DX: Dyspnea, unspecified: R06.00

## 2019-08-01 LAB — GLUCOSE, CAPILLARY
Glucose-Capillary: 158 mg/dL — ABNORMAL HIGH (ref 70–99)
Glucose-Capillary: 143 mg/dL — ABNORMAL HIGH (ref 70–99)

## 2019-08-01 LAB — TYPE AND SCREEN
ABO/RH(D): O POS
Antibody Screen: NEGATIVE

## 2019-08-01 LAB — ABO/RH: ABO/RH(D): O POS

## 2019-08-01 SURGERY — COLECTOMY, PARTIAL, ROBOT-ASSISTED, LAPAROSCOPIC
Anesthesia: General | Site: Abdomen

## 2019-08-01 MED ORDER — ENOXAPARIN SODIUM 40 MG/0.4ML ~~LOC~~ SOLN
40.0000 mg | SUBCUTANEOUS | Status: DC
  Administered 2019-08-02 – 2019-08-04 (×3): 40 mg via SUBCUTANEOUS
  Filled 2019-08-01 (×3): qty 0.4

## 2019-08-01 MED ORDER — METOPROLOL TARTRATE 5 MG/5ML IV SOLN
INTRAVENOUS | Status: AC
  Filled 2019-08-01: qty 5

## 2019-08-01 MED ORDER — FENTANYL CITRATE (PF) 250 MCG/5ML IJ SOLN
INTRAMUSCULAR | Status: AC
  Filled 2019-08-01: qty 5

## 2019-08-01 MED ORDER — MIDAZOLAM HCL 5 MG/5ML IJ SOLN
INTRAMUSCULAR | Status: DC | PRN
  Administered 2019-08-01: 2 mg via INTRAVENOUS

## 2019-08-01 MED ORDER — BISACODYL 5 MG PO TBEC: 20.0000 mg | DELAYED_RELEASE_TABLET | Freq: Once | ORAL | Status: DC

## 2019-08-01 MED ORDER — BUPIVACAINE HCL (PF) 0.25 % IJ SOLN
INTRAMUSCULAR | Status: AC
  Filled 2019-08-01: qty 60

## 2019-08-01 MED ORDER — SODIUM CHLORIDE 0.9 % IV SOLN
2.0000 g | Freq: Two times a day (BID) | INTRAVENOUS | Status: AC
  Administered 2019-08-01: 2 g via INTRAVENOUS
  Filled 2019-08-01: qty 2

## 2019-08-01 MED ORDER — POLYETHYLENE GLYCOL 3350 17 GM/SCOOP PO POWD
1.0000 | Freq: Once | ORAL | Status: DC
  Filled 2019-08-01: qty 255

## 2019-08-01 MED ORDER — ACETAMINOPHEN 10 MG/ML IV SOLN
1000.0000 mg | Freq: Once | INTRAVENOUS | Status: DC | PRN
  Administered 2019-08-01: 1000 mg via INTRAVENOUS

## 2019-08-01 MED ORDER — DIBUCAINE (PERIANAL) 1 % EX OINT
TOPICAL_OINTMENT | CUTANEOUS | Status: AC
  Filled 2019-08-01: qty 28

## 2019-08-01 MED ORDER — TRAMADOL HCL 50 MG PO TABS
50.0000 mg | ORAL_TABLET | Freq: Four times a day (QID) | ORAL | Status: DC | PRN
  Administered 2019-08-02: 100 mg via ORAL
  Administered 2019-08-02 – 2019-08-03 (×2): 50 mg via ORAL
  Filled 2019-08-01 (×2): qty 1
  Filled 2019-08-01: qty 2

## 2019-08-01 MED ORDER — SODIUM CHLORIDE 0.9 % IV SOLN
2.0000 g | INTRAVENOUS | Status: AC
  Administered 2019-08-01: 2 g via INTRAVENOUS
  Filled 2019-08-01: qty 2

## 2019-08-01 MED ORDER — DEXAMETHASONE SODIUM PHOSPHATE 10 MG/ML IJ SOLN
INTRAMUSCULAR | Status: AC
  Filled 2019-08-01: qty 1

## 2019-08-01 MED ORDER — FENTANYL CITRATE (PF) 100 MCG/2ML IJ SOLN
INTRAMUSCULAR | Status: DC | PRN
  Administered 2019-08-01: 50 ug via INTRAVENOUS
  Administered 2019-08-01 (×2): 100 ug via INTRAVENOUS
  Administered 2019-08-01 (×2): 50 ug via INTRAVENOUS
  Administered 2019-08-01: 100 ug via INTRAVENOUS

## 2019-08-01 MED ORDER — LACTATED RINGERS IV SOLN: INTRAVENOUS | Status: DC | PRN

## 2019-08-01 MED ORDER — ONDANSETRON HCL 4 MG/2ML IJ SOLN
INTRAMUSCULAR | Status: DC | PRN
  Administered 2019-08-01: 4 mg via INTRAVENOUS

## 2019-08-01 MED ORDER — ENOXAPARIN SODIUM 40 MG/0.4ML ~~LOC~~ SOLN
40.0000 mg | Freq: Once | SUBCUTANEOUS | Status: AC
  Administered 2019-08-01: 40 mg via SUBCUTANEOUS
  Filled 2019-08-01: qty 0.4

## 2019-08-01 MED ORDER — ACETAMINOPHEN 10 MG/ML IV SOLN
INTRAVENOUS | Status: AC
  Filled 2019-08-01: qty 100

## 2019-08-01 MED ORDER — SODIUM CHLORIDE 0.9 % IV SOLN: Freq: Three times a day (TID) | INTRAVENOUS | Status: DC | PRN

## 2019-08-01 MED ORDER — PROPOFOL 10 MG/ML IV BOLUS
INTRAVENOUS | Status: AC
  Filled 2019-08-01: qty 20

## 2019-08-01 MED ORDER — BUPIVACAINE LIPOSOME 1.3 % IJ SUSP
INTRAMUSCULAR | Status: DC | PRN
  Administered 2019-08-01: 20 mL

## 2019-08-01 MED ORDER — HYDROMORPHONE HCL 1 MG/ML IJ SOLN
INTRAMUSCULAR | Status: DC | PRN
  Administered 2019-08-01: 1 mg via INTRAVENOUS
  Administered 2019-08-01 (×2): .5 mg via INTRAVENOUS

## 2019-08-01 MED ORDER — VITAMIN B-12 1000 MCG PO TABS
1000.0000 ug | ORAL_TABLET | Freq: Every evening | ORAL | Status: DC
  Administered 2019-08-01 – 2019-08-03 (×3): 1000 ug via ORAL
  Filled 2019-08-01 (×3): qty 1

## 2019-08-01 MED ORDER — SIMVASTATIN 40 MG PO TABS
40.0000 mg | ORAL_TABLET | Freq: Every evening | ORAL | Status: DC
  Administered 2019-08-01 – 2019-08-03 (×3): 40 mg via ORAL
  Filled 2019-08-01 (×3): qty 1

## 2019-08-01 MED ORDER — LACTATED RINGERS IR SOLN
Status: DC | PRN
  Administered 2019-08-01: 1000 mL

## 2019-08-01 MED ORDER — PHENYLEPHRINE 40 MCG/ML (10ML) SYRINGE FOR IV PUSH (FOR BLOOD PRESSURE SUPPORT)
PREFILLED_SYRINGE | INTRAVENOUS | Status: DC | PRN
  Administered 2019-08-01 (×4): 80 ug via INTRAVENOUS

## 2019-08-01 MED ORDER — ACETAMINOPHEN 500 MG PO TABS
1000.0000 mg | ORAL_TABLET | ORAL | Status: AC
  Administered 2019-08-01: 1000 mg via ORAL
  Filled 2019-08-01: qty 2

## 2019-08-01 MED ORDER — ASPIRIN EC 81 MG PO TBEC
81.0000 mg | DELAYED_RELEASE_TABLET | Freq: Every evening | ORAL | Status: DC
  Administered 2019-08-02 – 2019-08-03 (×2): 81 mg via ORAL
  Filled 2019-08-01 (×2): qty 1

## 2019-08-01 MED ORDER — ALVIMOPAN 12 MG PO CAPS
12.0000 mg | ORAL_CAPSULE | Freq: Two times a day (BID) | ORAL | Status: DC
  Administered 2019-08-02: 12 mg via ORAL
  Filled 2019-08-01 (×3): qty 1

## 2019-08-01 MED ORDER — ONDANSETRON HCL 4 MG/2ML IJ SOLN: 4.0000 mg | Freq: Four times a day (QID) | INTRAMUSCULAR | Status: DC | PRN

## 2019-08-01 MED ORDER — MAGIC MOUTHWASH
15.0000 mL | Freq: Four times a day (QID) | ORAL | Status: DC | PRN
  Filled 2019-08-01: qty 15

## 2019-08-01 MED ORDER — GABAPENTIN 100 MG PO CAPS
200.0000 mg | ORAL_CAPSULE | Freq: Three times a day (TID) | ORAL | Status: DC
  Administered 2019-08-01 – 2019-08-04 (×7): 200 mg via ORAL
  Filled 2019-08-01 (×7): qty 2

## 2019-08-01 MED ORDER — LIDOCAINE 2% (20 MG/ML) 5 ML SYRINGE
INTRAMUSCULAR | Status: AC
  Filled 2019-08-01: qty 5

## 2019-08-01 MED ORDER — ONDANSETRON HCL 4 MG/2ML IJ SOLN
INTRAMUSCULAR | Status: AC
  Filled 2019-08-01: qty 2

## 2019-08-01 MED ORDER — ACETAMINOPHEN 500 MG PO TABS
1000.0000 mg | ORAL_TABLET | Freq: Four times a day (QID) | ORAL | Status: DC
  Administered 2019-08-02 – 2019-08-04 (×9): 1000 mg via ORAL
  Filled 2019-08-01 (×9): qty 2

## 2019-08-01 MED ORDER — ACETAMINOPHEN 325 MG PO TABS: 325.0000 mg | ORAL_TABLET | Freq: Once | ORAL | Status: DC | PRN

## 2019-08-01 MED ORDER — ACETAMINOPHEN 500 MG PO TABS: 1000.0000 mg | ORAL_TABLET | Freq: Four times a day (QID) | ORAL | Status: DC

## 2019-08-01 MED ORDER — HYDROMORPHONE HCL 1 MG/ML IJ SOLN: 0.5000 mg | INTRAMUSCULAR | Status: DC | PRN

## 2019-08-01 MED ORDER — ROCURONIUM BROMIDE 10 MG/ML (PF) SYRINGE
PREFILLED_SYRINGE | INTRAVENOUS | Status: AC
  Filled 2019-08-01: qty 10

## 2019-08-01 MED ORDER — NITROGLYCERIN 0.4 MG SL SUBL: 0.4000 mg | SUBLINGUAL_TABLET | SUBLINGUAL | Status: DC | PRN

## 2019-08-01 MED ORDER — KETAMINE HCL 10 MG/ML IJ SOLN
INTRAMUSCULAR | Status: DC | PRN
Start: 2019-08-01 — End: 2019-08-01
  Administered 2019-08-01: 25 mg via INTRAVENOUS

## 2019-08-01 MED ORDER — FENTANYL CITRATE (PF) 100 MCG/2ML IJ SOLN
25.0000 ug | INTRAMUSCULAR | Status: DC | PRN
  Administered 2019-08-01: 50 ug via INTRAVENOUS

## 2019-08-01 MED ORDER — ROCURONIUM BROMIDE 10 MG/ML (PF) SYRINGE
PREFILLED_SYRINGE | INTRAVENOUS | Status: DC | PRN
  Administered 2019-08-01: 30 mg via INTRAVENOUS
  Administered 2019-08-01: 20 mg via INTRAVENOUS
  Administered 2019-08-01: 30 mg via INTRAVENOUS
  Administered 2019-08-01: 70 mg via INTRAVENOUS
  Administered 2019-08-01: 10 mg via INTRAVENOUS

## 2019-08-01 MED ORDER — DIBUCAINE (PERIANAL) 1 % EX OINT
TOPICAL_OINTMENT | CUTANEOUS | Status: DC | PRN
  Administered 2019-08-01: 1 via RECTAL

## 2019-08-01 MED ORDER — LABETALOL HCL 5 MG/ML IV SOLN
INTRAVENOUS | Status: AC
  Filled 2019-08-01: qty 4

## 2019-08-01 MED ORDER — LIDOCAINE 2% (20 MG/ML) 5 ML SYRINGE
INTRAMUSCULAR | Status: DC | PRN
  Administered 2019-08-01: 100 mg via INTRAVENOUS

## 2019-08-01 MED ORDER — MIDAZOLAM HCL 2 MG/2ML IJ SOLN
INTRAMUSCULAR | Status: AC
  Filled 2019-08-01: qty 2

## 2019-08-01 MED ORDER — LISINOPRIL 10 MG PO TABS
10.0000 mg | ORAL_TABLET | Freq: Every evening | ORAL | Status: DC
  Administered 2019-08-01 – 2019-08-03 (×3): 10 mg via ORAL
  Filled 2019-08-01 (×3): qty 1

## 2019-08-01 MED ORDER — FENTANYL CITRATE (PF) 100 MCG/2ML IJ SOLN
INTRAMUSCULAR | Status: AC
  Filled 2019-08-01: qty 2

## 2019-08-01 MED ORDER — GABAPENTIN 300 MG PO CAPS
300.0000 mg | ORAL_CAPSULE | ORAL | Status: AC
  Administered 2019-08-01: 300 mg via ORAL
  Filled 2019-08-01: qty 1

## 2019-08-01 MED ORDER — FENTANYL CITRATE (PF) 100 MCG/2ML IJ SOLN
INTRAMUSCULAR | Status: AC
  Filled 2019-08-01: qty 4

## 2019-08-01 MED ORDER — ENSURE SURGERY PO LIQD
237.0000 mL | Freq: Two times a day (BID) | ORAL | Status: DC
  Administered 2019-08-02: 237 mL via ORAL
  Filled 2019-08-01 (×6): qty 237

## 2019-08-01 MED ORDER — BUPIVACAINE HCL (PF) 0.25 % IJ SOLN
INTRAMUSCULAR | Status: DC | PRN
  Administered 2019-08-01: 60 mL

## 2019-08-01 MED ORDER — PANTOPRAZOLE SODIUM 40 MG PO TBEC
40.0000 mg | DELAYED_RELEASE_TABLET | ORAL | Status: DC
  Administered 2019-08-01 – 2019-08-03 (×2): 40 mg via ORAL
  Filled 2019-08-01 (×2): qty 1

## 2019-08-01 MED ORDER — METHYLENE BLUE 0.5 % INJ SOLN
INTRAVENOUS | Status: AC
  Filled 2019-08-01: qty 10

## 2019-08-01 MED ORDER — DEXAMETHASONE SODIUM PHOSPHATE 4 MG/ML IJ SOLN
INTRAMUSCULAR | Status: DC | PRN
  Administered 2019-08-01: 5 mg via INTRAVENOUS

## 2019-08-01 MED ORDER — LACTATED RINGERS IV BOLUS
1000.0000 mL | Freq: Three times a day (TID) | INTRAVENOUS | Status: AC | PRN
  Administered 2019-08-03: 1000 mL via INTRAVENOUS

## 2019-08-01 MED ORDER — DIPHENHYDRAMINE HCL 50 MG/ML IJ SOLN: 12.5000 mg | Freq: Four times a day (QID) | INTRAMUSCULAR | Status: DC | PRN

## 2019-08-01 MED ORDER — ALVIMOPAN 12 MG PO CAPS
12.0000 mg | ORAL_CAPSULE | ORAL | Status: AC
  Administered 2019-08-01: 12 mg via ORAL
  Filled 2019-08-01: qty 1

## 2019-08-01 MED ORDER — LACTATED RINGERS IV SOLN: INTRAVENOUS | Status: DC

## 2019-08-01 MED ORDER — LIP MEDEX EX OINT
1.0000 "application " | TOPICAL_OINTMENT | Freq: Two times a day (BID) | CUTANEOUS | Status: DC
  Administered 2019-08-01 – 2019-08-04 (×6): 1 via TOPICAL
  Filled 2019-08-01 (×4): qty 7

## 2019-08-01 MED ORDER — ONDANSETRON HCL 4 MG PO TABS
4.0000 mg | ORAL_TABLET | Freq: Four times a day (QID) | ORAL | Status: DC | PRN
  Administered 2019-08-03: 09:00:00 4 mg via ORAL

## 2019-08-01 MED ORDER — METOPROLOL TARTRATE 5 MG/5ML IV SOLN
5.0000 mg | Freq: Four times a day (QID) | INTRAVENOUS | Status: DC | PRN
  Administered 2019-08-01: 5 mg via INTRAVENOUS

## 2019-08-01 MED ORDER — MEPERIDINE HCL 50 MG/ML IJ SOLN: 6.2500 mg | INTRAMUSCULAR | Status: DC | PRN

## 2019-08-01 MED ORDER — PROCHLORPERAZINE MALEATE 10 MG PO TABS: 10.0000 mg | ORAL_TABLET | Freq: Four times a day (QID) | ORAL | Status: DC | PRN

## 2019-08-01 MED ORDER — PROPOFOL 10 MG/ML IV BOLUS
INTRAVENOUS | Status: DC | PRN
  Administered 2019-08-01: 140 mg via INTRAVENOUS

## 2019-08-01 MED ORDER — HYDROMORPHONE HCL 2 MG/ML IJ SOLN
INTRAMUSCULAR | Status: AC
  Filled 2019-08-01: qty 1

## 2019-08-01 MED ORDER — ALUM & MAG HYDROXIDE-SIMETH 200-200-20 MG/5ML PO SUSP
30.0000 mL | Freq: Four times a day (QID) | ORAL | Status: DC | PRN
  Administered 2019-08-04: 30 mL via ORAL
  Filled 2019-08-01: qty 30

## 2019-08-01 MED ORDER — METRONIDAZOLE 500 MG PO TABS
1000.0000 mg | ORAL_TABLET | ORAL | Status: DC
  Filled 2019-08-01: qty 2

## 2019-08-01 MED ORDER — PROMETHAZINE HCL 25 MG/ML IJ SOLN: 6.2500 mg | INTRAMUSCULAR | Status: DC | PRN

## 2019-08-01 MED ORDER — DIPHENHYDRAMINE HCL 12.5 MG/5ML PO ELIX: 12.5000 mg | ORAL_SOLUTION | Freq: Four times a day (QID) | ORAL | Status: DC | PRN

## 2019-08-01 MED ORDER — PHENYLEPHRINE 40 MCG/ML (10ML) SYRINGE FOR IV PUSH (FOR BLOOD PRESSURE SUPPORT)
PREFILLED_SYRINGE | INTRAVENOUS | Status: AC
  Filled 2019-08-01: qty 10

## 2019-08-01 MED ORDER — NEOMYCIN SULFATE 500 MG PO TABS
1000.0000 mg | ORAL_TABLET | ORAL | Status: DC
  Filled 2019-08-01: qty 2

## 2019-08-01 MED ORDER — PROCHLORPERAZINE EDISYLATE 10 MG/2ML IJ SOLN: 5.0000 mg | Freq: Four times a day (QID) | INTRAMUSCULAR | Status: DC | PRN

## 2019-08-01 MED ORDER — ENALAPRILAT 1.25 MG/ML IV SOLN
0.6250 mg | Freq: Four times a day (QID) | INTRAVENOUS | Status: DC | PRN
  Filled 2019-08-01: qty 1

## 2019-08-01 MED ORDER — ACETAMINOPHEN 160 MG/5ML PO SOLN: 325.0000 mg | Freq: Once | ORAL | Status: DC | PRN

## 2019-08-01 SURGICAL SUPPLY — 106 items
APPLIER CLIP 5 13 M/L LIGAMAX5 (MISCELLANEOUS)
APPLIER CLIP ROT 10 11.4 M/L (STAPLE)
BLADE EXTENDED COATED 6.5IN (ELECTRODE) IMPLANT
CANNULA REDUC XI 12-8 STAPL (CANNULA) ×2
CANNULA REDUCER 12-8 DVNC XI (CANNULA) ×1 IMPLANT
CELLS DAT CNTRL 66122 CELL SVR (MISCELLANEOUS) IMPLANT
CHLORAPREP W/TINT 26 (MISCELLANEOUS) ×2 IMPLANT
CLIP APPLIE 5 13 M/L LIGAMAX5 (MISCELLANEOUS) IMPLANT
CLIP APPLIE ROT 10 11.4 M/L (STAPLE) IMPLANT
COVER SURGICAL LIGHT HANDLE (MISCELLANEOUS) ×4 IMPLANT
COVER TIP SHEARS 8 DVNC (MISCELLANEOUS) ×1 IMPLANT
COVER TIP SHEARS 8MM DA VINCI (MISCELLANEOUS) ×2
COVER WAND RF STERILE (DRAPES) ×2 IMPLANT
DEVICE TROCAR PUNCTURE CLOSURE (ENDOMECHANICALS) IMPLANT
DRAIN CHANNEL 19F RND (DRAIN) IMPLANT
DRAPE ARM DVNC X/XI (DISPOSABLE) ×4 IMPLANT
DRAPE COLUMN DVNC XI (DISPOSABLE) ×1 IMPLANT
DRAPE DA VINCI XI ARM (DISPOSABLE) ×8
DRAPE DA VINCI XI COLUMN (DISPOSABLE) ×2
DRAPE SURG IRRIG POUCH 19X23 (DRAPES) ×2 IMPLANT
DRSG OPSITE POSTOP 4X10 (GAUZE/BANDAGES/DRESSINGS) IMPLANT
DRSG OPSITE POSTOP 4X6 (GAUZE/BANDAGES/DRESSINGS) ×2 IMPLANT
DRSG OPSITE POSTOP 4X8 (GAUZE/BANDAGES/DRESSINGS) IMPLANT
DRSG TEGADERM 2-3/8X2-3/4 SM (GAUZE/BANDAGES/DRESSINGS) ×2 IMPLANT
DRSG TEGADERM 4X4.75 (GAUZE/BANDAGES/DRESSINGS) IMPLANT
ELECT PENCIL ROCKER SW 15FT (MISCELLANEOUS) IMPLANT
ELECT REM PT RETURN 15FT ADLT (MISCELLANEOUS) ×2 IMPLANT
ENDOLOOP SUT PDS II  0 18 (SUTURE)
ENDOLOOP SUT PDS II 0 18 (SUTURE) IMPLANT
EVACUATOR SILICONE 100CC (DRAIN) IMPLANT
GAUZE SPONGE 2X2 8PLY STRL LF (GAUZE/BANDAGES/DRESSINGS) ×1 IMPLANT
GAUZE SPONGE 4X4 12PLY STRL (GAUZE/BANDAGES/DRESSINGS) ×2 IMPLANT
GLOVE ECLIPSE 8.0 STRL XLNG CF (GLOVE) ×6 IMPLANT
GLOVE INDICATOR 8.0 STRL GRN (GLOVE) ×6 IMPLANT
GOWN STRL REUS W/TWL XL LVL3 (GOWN DISPOSABLE) ×6 IMPLANT
GRASPER SUT TROCAR 14GX15 (MISCELLANEOUS) IMPLANT
HOLDER FOLEY CATH W/STRAP (MISCELLANEOUS) ×2 IMPLANT
IRRIG SUCT STRYKERFLOW 2 WTIP (MISCELLANEOUS) ×2
IRRIGATION SUCT STRKRFLW 2 WTP (MISCELLANEOUS) ×1 IMPLANT
KIT PROCEDURE DA VINCI SI (MISCELLANEOUS) ×2
KIT PROCEDURE DVNC SI (MISCELLANEOUS) ×1 IMPLANT
KIT SIGMOIDOSCOPE (SET/KITS/TRAYS/PACK) IMPLANT
KIT TURNOVER KIT A (KITS) IMPLANT
NEEDLE HYPO 22GX1.5 SAFETY (NEEDLE) ×2 IMPLANT
NEEDLE INSUFFLATION 14GA 120MM (NEEDLE) ×2 IMPLANT
PACK GENERAL/GYN (CUSTOM PROCEDURE TRAY) ×2 IMPLANT
PAD POSITIONING PINK XL (MISCELLANEOUS) ×2 IMPLANT
PORT LAP GEL ALEXIS MED 5-9CM (MISCELLANEOUS) ×2 IMPLANT
PROTECTOR NERVE ULNAR (MISCELLANEOUS) ×2 IMPLANT
RELOAD STAPLER 2.5X60 WHT DVNC (STAPLE) ×1 IMPLANT
RELOAD STAPLER 3.5X60 BLU DVNC (STAPLE) ×2 IMPLANT
RELOAD STAPLER 4.3X60 GRN DVNC (STAPLE) IMPLANT
RTRCTR WOUND ALEXIS 18CM MED (MISCELLANEOUS)
SCISSORS LAP 5X35 DISP (ENDOMECHANICALS) ×2 IMPLANT
SEAL CANN UNIV 5-8 DVNC XI (MISCELLANEOUS) ×4 IMPLANT
SEAL XI 5MM-8MM UNIVERSAL (MISCELLANEOUS) ×8
SEALER VESSEL DA VINCI XI (MISCELLANEOUS) ×2
SEALER VESSEL EXT DVNC XI (MISCELLANEOUS) ×1 IMPLANT
SOLUTION ELECTROLUBE (MISCELLANEOUS) ×2 IMPLANT
SPONGE GAUZE 2X2 STER 10/PKG (GAUZE/BANDAGES/DRESSINGS) ×1
STAPLER 45 BLU RELOAD XI (STAPLE) IMPLANT
STAPLER 45 BLUE RELOAD XI (STAPLE)
STAPLER 45 DA VINCI SURE FORM (STAPLE)
STAPLER 45 GREEN RELOAD XI (STAPLE)
STAPLER 45 GRN RELOAD XI (STAPLE) IMPLANT
STAPLER 45 SUREFORM DVNC (STAPLE) IMPLANT
STAPLER 60 DA VINCI SURE FORM (STAPLE) ×2
STAPLER 60 SUREFORM DVNC (STAPLE) ×1 IMPLANT
STAPLER CANNULA SEAL DVNC XI (STAPLE) ×1 IMPLANT
STAPLER CANNULA SEAL XI (STAPLE) ×2
STAPLER RELOAD 2.5X60 WHITE (STAPLE) ×2
STAPLER RELOAD 2.5X60 WHT DVNC (STAPLE) ×1
STAPLER RELOAD 3.5X60 BLU DVNC (STAPLE) ×2
STAPLER RELOAD 3.5X60 BLUE (STAPLE) ×4
STAPLER RELOAD 4.3X60 GREEN (STAPLE)
STAPLER RELOAD 4.3X60 GRN DVNC (STAPLE)
STOPCOCK 4 WAY LG BORE MALE ST (IV SETS) ×4 IMPLANT
SURGILUBE 2OZ TUBE FLIPTOP (MISCELLANEOUS) IMPLANT
SUT MNCRL AB 4-0 PS2 18 (SUTURE) ×2 IMPLANT
SUT PDS AB 1 CT1 27 (SUTURE) ×4 IMPLANT
SUT PROLENE 0 CT 2 (SUTURE) IMPLANT
SUT PROLENE 2 0 KS (SUTURE) IMPLANT
SUT PROLENE 2 0 SH DA (SUTURE) IMPLANT
SUT SILK 2 0 (SUTURE)
SUT SILK 2 0 SH CR/8 (SUTURE) IMPLANT
SUT SILK 2-0 18XBRD TIE 12 (SUTURE) IMPLANT
SUT SILK 3 0 (SUTURE)
SUT SILK 3 0 SH CR/8 (SUTURE) ×2 IMPLANT
SUT SILK 3-0 18XBRD TIE 12 (SUTURE) IMPLANT
SUT V-LOC BARB 180 2/0GR6 GS22 (SUTURE)
SUT VIC AB 2-0 UR6 27 (SUTURE) ×8 IMPLANT
SUT VIC AB 3-0 SH 18 (SUTURE) IMPLANT
SUT VIC AB 3-0 SH 27 (SUTURE)
SUT VIC AB 3-0 SH 27XBRD (SUTURE) IMPLANT
SUT VICRYL 0 UR6 27IN ABS (SUTURE) ×2 IMPLANT
SUT VLOC BARB 180 ABS3/0GR12 (SUTURE) ×4
SUTURE V-LC BRB 180 2/0GR6GS22 (SUTURE) IMPLANT
SUTURE VLOC BRB 180 ABS3/0GR12 (SUTURE) ×2 IMPLANT
SYR 10ML ECCENTRIC (SYRINGE) ×2 IMPLANT
SYS LAPSCP GELPORT 120MM (MISCELLANEOUS)
SYSTEM LAPSCP GELPORT 120MM (MISCELLANEOUS) IMPLANT
TOWEL OR NON WOVEN STRL DISP B (DISPOSABLE) ×2 IMPLANT
TRAY FOLEY MTR SLVR 14FR STAT (SET/KITS/TRAYS/PACK) ×2 IMPLANT
TROCAR ADV FIXATION 5X100MM (TROCAR) ×2 IMPLANT
TUBING CONNECTING 10 (TUBING) IMPLANT
TUBING INSUFFLATION 10FT LAP (TUBING) ×2 IMPLANT

## 2019-08-01 NOTE — Interval H&P Note (Signed)
History and Physical Interval Note:  08/01/2019 12:49 PM  [name] [name]  has presented today for surgery, with the diagnosis of CANCER OF TRANSVERSE COLON, POSSIBLE ANAL FISTULA.  The various methods of treatment have been discussed with the patient and family. After consideration of risks, benefits and other options for treatment, the patient has consented to  Procedure(s): XI [place] (N/A) as a surgical intervention.  The patient's history has been reviewed, patient examined, no change in status, stable for surgery.  I have reviewed the patient's chart and labs.  Questions were answered to the patient's satisfaction.    I have re-reviewed the the patient's records, history, medications, and allergies.  I have re-examined the patient.  I again discussed intraoperative plans and goals of post-operative recovery.  The patient agrees to proceed.  Lawerance Matsuo  1950-12-05 [id number]  Patient Care Team: Corine Shelter, PA-C as PCP - General (Physician Assistant) Minus Breeding, MD as PCP - Cardiology (Cardiology) Jonnie Finner, RN as Oncology Nurse Navigator Michael Boston, MD as Consulting Physician (General Surgery) Minus Breeding, MD as Consulting Physician (Cardiology) Loletha Carrow Kirke Corin, MD as Consulting Physician (Gastroenterology) Truitt Merle, MD as Consulting Physician (Oncology)  Patient Active Problem List   Diagnosis Date Noted   Cancer of left colon [facility]) 06/03/2019    Priority: High   Perirectal abscess 03/29/2019   Dyslipidemia 12/18/2017   Cough 10/07/2011   Tobacco abuse 08/17/2010   FATTY LIVER DISEASE 08/25/2008   PURE HYPERCHOLESTEROLEMIA 08/22/2008   DYSLIPIDEMIA 06/24/2008   OVERWEIGHT 06/24/2008   DERMATITIS DUE DRUGS&MEDICINES TAKEN INTERNALLY 06/24/2008   Essential hypertension 06/20/2008   CAD in native artery 06/20/2008    Past Medical History:  Diagnosis Date   Arthritis    Colon cancer ([place])    COPD  (chronic obstructive pulmonary disease) ([place])    mild per pt.   Coronary artery disease    cardiac catheterization on January 26,2010, right coronary arter distal occlusion with stenting of this with xience drug-eluting stent.  Pt also had liminal irregularities  in the LAD and 40-50% mid circumflex  stenosis   Diabetes mellitus without complication (HCC)    Dyspnea    exersion only   GERD (gastroesophageal reflux disease)    History of colon polyps    History of kidney stones    Hyperlipidemia    Hypertension    Myocardial infarction [facility]) 2010   Perirectal abscess    Pneumonia 2013   Stomach ulcer 1970's    Past Surgical History:  Procedure Laterality Date   COLONOSCOPY     CORONARY ANGIOPLASTY WITH STENT PLACEMENT  2010   POLYPECTOMY      Social History   Socioeconomic History   Marital status: Married    Spouse name: Not on file   Number of children: 3   Years of education: Not on file   Highest education level: Not on file  Occupational History   Occupation: Carpenter  Tobacco Use   Smoking status: Current Every Day Smoker    Packs/day: 1.00    Years: 55.00    Pack years: 55.00    Types: Cigarettes   Smokeless tobacco: Former Network engineer and Sexual Activity   Alcohol use: Not Currently   Drug use: No   Sexual activity: Not on file  Other Topics Concern   Not on file  Social History Narrative   Not on file   Social Determinants of Health   Financial Resource Strain:  Difficulty of Paying Living Expenses:   Food Insecurity:    Worried About Charity fundraiser in the Last Year:    Arboriculturist in the Last Year:   Transportation Needs:    Film/video editor (Medical):    Lack of Transportation (Non-Medical):   Physical Activity:    Days of Exercise per Week:    Minutes of Exercise per Session:   Stress:    Feeling of Stress :   Social Connections:    Frequency of Communication with Friends and Family:    Frequency of Social Gatherings  with Friends and Family:    Attends Religious Services:    Active Member of Clubs or Organizations:    Attends Music therapist:    Marital Status:   Intimate Partner Violence:    Fear of Current or Ex-Partner:    Emotionally Abused:    Physically Abused:    Sexually Abused:     Family History  Problem Relation Age of Onset   Colon cancer Mother 83   Diabetes Mother    Lung cancer Father    Colon polyps Neg Hx    Esophageal cancer Neg Hx    Rectal cancer Neg Hx    Stomach cancer Neg Hx     Facility-Administered Medications Prior to Admission  Medication Dose Route Frequency Provider Last Rate Last Admin   0.9 %  sodium chloride infusion  500 mL Intravenous Once Doran Stabler, MD       Medications Prior to Admission  Medication Sig Dispense Refill Last Dose   aspirin EC 81 MG tablet Take 81 mg by mouth every evening.    07/31/2019 at 2000   b complex vitamins tablet Take 1 tablet by mouth every evening.    07/31/2019 at 2000   lisinopril (PRINIVIL,ZESTRIL) 10 MG tablet TAKE 1 TABLET BY MOUTH EVERY DAY (Patient taking differently: Take 10 mg by mouth every evening. TAKE 1 TABLET BY MOUTH EVERY DAY) 30 tablet 3 07/31/2019 at 2000   metFORMIN (GLUCOPHAGE) 500 MG tablet Take 500 mg by mouth every evening.    07/31/2019 at 2000   naproxen sodium (ALEVE) 220 MG tablet Take 220-440 mg by mouth 2 (two) times daily as needed (pain.).   07/29/2019   nitroGLYCERIN (NITROSTAT) 0.4 MG SL tablet Place 1 tablet (0.4 mg total) under the tongue every 5 (five) minutes as needed. 25 tablet 2    pantoprazole (PROTONIX) 40 MG tablet Take 40 mg by mouth every other day. In the evening.   07/30/2019 at 2000   simvastatin (ZOCOR) 40 MG tablet TAKE 1 TABLET (40 MG TOTAL) BY MOUTH AT BEDTIME. (Patient taking differently: Take 40 mg by mouth every evening. ) 30 tablet 6 07/31/2019 at 2000   vitamin B-12 (CYANOCOBALAMIN) 1000 MCG tablet Take 1,000 mcg by mouth every evening.   07/31/2019 at 2000    metroNIDAZOLE (FLAGYL) 500 MG tablet Take 1,000 mg by mouth in the morning, at noon, and at bedtime.   07/31/2019 at 2200   neomycin (MYCIFRADIN) 500 MG tablet Take 1,000 mg by mouth in the morning, at noon, and at bedtime.   07/31/2019 at [place] KIT 17.5-3.13-1.6 GM/177ML SOLN Take 354 mLs by mouth once.   07/31/2019 at 1245    Current Facility-Administered Medications  Medication Dose Route Frequency Provider Last Rate Last Admin   bisacodyl (DULCOLAX) EC tablet 20 mg  20 mg Oral Once Michael Boston, MD  bupivacaine liposome (EXPAREL) 1.3 % injection 266 mg  20 mL Infiltration Once Green, Terri L, RPH       cefoTEtan (CEFOTAN) 2 g in sodium chloride 0.9 % 100 mL IVPB  2 g Intravenous On Call to OR Michael Boston, MD       lactated ringers infusion   Intravenous Continuous Effie Berkshire, MD 75 mL/hr at 08/01/19 1200 New Bag at 08/01/19 1200   neomycin (MYCIFRADIN) tablet 1,000 mg  1,000 mg Oral 3 times per day Michael Boston, MD       And   metroNIDAZOLE (FLAGYL) tablet 1,000 mg  1,000 mg Oral 3 times per day Michael Boston, MD       polyethylene glycol powder (GLYCOLAX/MIRALAX) container 255 g  1 Container Oral Once Michael Boston, MD         Allergies  Allergen Reactions   Clopidogrel Bisulfate Hives    Plavix="hives"    BP (!) 146/75   Pulse 73   Temp 97.9 F (36.6 C) (Oral)   Resp 17   SpO2 97%   Labs: Results for orders placed or performed during the hospital encounter of 08/01/19 (from the past 48 hour(s))  Glucose, capillary     Status: Abnormal   Collection Time: 08/01/19 11:02 AM  Result Value Ref Range   Glucose-Capillary 158 (H) 70 - 99 mg/dL    Comment: Glucose reference range applies only to samples taken after fasting for at least 8 hours.    Imaging / Studies: No results found.   Adin Hector, M.D., F.A.C.S. Gastrointestinal and Minimally Invasive Surgery [facility] [address], [place], [place]  [address] [phone] Main / Paging  08/01/2019 12:50 PM    Adin Hector

## 2019-08-01 NOTE — Anesthesia Procedure Notes (Signed)
Procedure Name: Intubation Date/Time: 08/01/2019 1:23 PM Performed by: Claudia Desanctis, CRNA Pre-anesthesia Checklist: Patient identified, Emergency Drugs available, Suction available and Patient being monitored Patient Re-evaluated:Patient Re-evaluated prior to induction Oxygen Delivery Method: Circle system utilized Preoxygenation: Pre-oxygenation with 100% oxygen Induction Type: IV induction Ventilation: Mask ventilation without difficulty Laryngoscope Size: 2 and Miller Grade View: Grade I Tube type: Oral Tube size: 8.0 mm Number of attempts: 1 Airway Equipment and Method: Stylet Placement Confirmation: ETT inserted through vocal cords under direct vision,  positive ETCO2 and breath sounds checked- equal and bilateral Secured at: 22 cm Tube secured with: Tape Dental Injury: Teeth and Oropharynx as per pre-operative assessment

## 2019-08-01 NOTE — Op Note (Signed)
08/01/2019  6:30 PM  PATIENT:  [name] [name]  69 y.o. 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  This assistant was needed for exposure, dissection, suctioning, retraction, instrument exchange, etc.  ANESTHESIA:     General  Nerve block provided with liposomal bupivacaine (Experel) mixed with 0.25% bupivacaine as a Bilateral TAP block x 63m each side at the level of the transverse abdominis & preperitoneal spaces along the flank at the anterior axillary line, from subcostal ridge to iliac crest under laparoscopic guidance   Local field block at port sites & extraction wound  Anorectal block  EBL:  Total I/O In: 3100 [I.V.:3000; IV Piggyback:100] Out: 200 [Urine:100; Blood:100]  Delay start of Pharmacological VTE agent (>24hrs) due to surgical blood loss or risk of bleeding:  no  DRAINS: No  SPECIMEN:   RECTOSIGMOID COLON (open end proximal) DISTAL ANASTOMOTIC RING (final distal margin) ANAL  FISTULA EXTERNAL HEMORRHOID  DISPOSITION OF SPECIMEN:  PATHOLOGY  COUNTS:  YES  PLAN OF CARE: Admit to inpatient   PATIENT DISPOSITION:  PACU - hemodynamically stable.  INDICATION:    Pleasant gentleman found to have a cancer in his transverse colon via CAT scan and colonoscopy.  I recommended segmental resection:  The anatomy & physiology of the digestive tract was discussed.  The pathophysiology was discussed.  Natural history risks without surgery was discussed.   I worked to give an overview of the disease and the frequent need to have multispecialty involvement.  I feel the risks of no intervention will lead to serious problems that outweigh the operative risks; therefore, I recommended a partial colectomy to remove the pathology.  Laparoscopic & open techniques were discussed.   Risks such as bleeding, infection, abscess, leak, reoperation, possible ostomy, hernia, heart attack, death, and other risks were discussed.  I noted a good likelihood this will help address the problem.   Goals of post-operative recovery were discussed as well.  We will work to minimize complications.  Educational materials on the pathology had been given in the office.  Questions were answered.    The patient expressed understanding & wished to proceed with surgery.  Patient also had chronic anal drainage suspicious for fistula.  He wished to try and have this addressed at the same time.  The anatomy & physiology of the anorectal region was discussed.  We discussed the pathophysiology of anorectal abscess and fistula.  Differential diagnosis was discussed.  Natural history progression was discussed.   I stressed the importance of a bowel regimen to have daily soft bowel movements to minimize progression of disease.     The patient's condition is not adequately controlled.  Non-operative  treatment has not healed the fistula.  Therefore, I recommended examination under anaesthesia to confirm the diagnosis and  treat the fistula.  I discussed techniques that may be required such as fistulotomy, ligation by LIFT technique, and/or seton placement.  Benefits & alternatives discussed.  I noted a good likelihood this will help address the problem, but sometimes repeat operations and prolonged healing times may occur.  Risks such as bleeding, pain, recurrence, reoperation, injury to other organs, need for repair of tissues / organs reoperation, incontinence, heart attack, death, and other risks were discussed.      Educational handouts further explaining the pathology, treatment options, and bowel regimen were given.  The patient expressed understanding & wishes to proceed.  We will work to coordinate surgery for a mutually convenient time.    OR FINDINGS:   Patient had obvious thickened tumor in the mid to distal transverse colon.  Resection.  Patient has a side-to-side antiperistaltic intracorporeal anastomosis of ileum to distal transverse colon just proximal to the splenic flexure.  It rests in the left upper quadrant.  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  Ladd's type procedure done with small bowel resting the right abdomen and remaining colon in the left gutter  No obvious metastatic disease on visceral parietal peritoneum or liver.  Patient had a right anterior perirectal opening 3 cm from the anal verge.  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  Ligation of the fistulous tract done at the sphincter level.  Ligation of the rectum using 2-0 Vicryl suture.  Grade 3 internal hemorrhoids.  Suture ligation and pexy done.  Left lateral external hemorrhoids and prolapsed anal canal polyp excised.  That wound left open.  Anorectal block done  CASE DATA:  Type of patient?: Elective [facility] Private Case  Status of Case?  Elective Scheduled  Infection Present At Time Of Surgery (PATOS)?  NO  DESCRIPTION:   Informed consent was confirmed.  The patient underwent general anaesthesia without difficulty.  The patient was positioned appropriately.  VTE prevention in place.  The patient was clipped, prepped, & draped in a sterile fashion.  Surgical timeout confirmed our plan.  The patient was positioned in reverse Trendelenburg.  Abdominal entry was gained using Varess technique at the left subcostal ridge on the anterior abdominal wall.  No elevated EtCO2 noted.  Port placed.  Camera inspection revealed no injury.  Extra ports were carefully placed under direct laparoscopic visualization.  As suspected on his CAT scan, his cecum and ascending colon were flopped over across his lower abdomen with the appendix resting in the left lower quadrant.  Suspicious for malrotation.  Began to elevate the transverse colon and help free adhesions of it off the cecum ascending colon towards the hepatic flexure to help splay out the mesentery.  I ran the small bowel which was resting primarily in the right abdomen.  The terminal ileum was densely folded upon itself creating a floppy pedicle.  I ended up freeing off the interloop adhesions to straighten it out.  I ran the small bowel until I found the ligament of Treitz resting in the right upper quadrant.  Definitely atypical.  I mobilized & reflected the greater omentum and small bowel in the upper abdomen.    I began to mobilize the terminal ileal and proximal colon mesentery off the retroperitoneum heading  towards the hepatic flexure in a lateral to medial fashion.  Eventually was able to find the duodenal sweep resting up at the hepatic flexure slightly more in the right upper quadrant than the expected right of midline.  I then transected the greater omentum off the distal transverse colon and headed more proximally towards the hepatic flexure.  Found tattooing in the mid distal and mid  transverse colon with thickening consistent with the tumor that correlated on CAT scan.  Eventually alternating from superior lateral approaches I was able to mobilize the hepatic flexure and right colon off the retroperitoneum.  I was able to elevate the proximal colon to isolate the ileocolonic pedicle.  I scored the ileal mesentery just proximal to that.   I carried that further dissection in a medial to lateral fashion.  I was able to bluntly get into the retro-mesenteric plane on the right side.  I freed the proximal right sided colonic mesentery off the retroperitoneum including the duodenal sweep, pancreatic head, & Gerota's fascia of the right kidney.  I did get into some oozing near the pancreatic head but controlled told that with focus bipolar and pressure.  I was able to get underneath the hepatic flexure.  I was able to get underneath the proximal and mid transverse colon.  I isolated the proximal ileocecal pedicle.  We confirmed that the small bowel was inferior and therefore the superior mesenteric artery was resting posterior.  We skeletonized around the ileocecal pedicle and got a 360 critical view with only the right colon anterior to this pedicle consistent with ileus cecal pedicle.  I skeletonized it & transected the vessels.  Then continually more proximally and found a right colic pedicle going towards the atypical hepatic flexure.  Again did a complete skeletonization and critical view and transected it.  I freed the stomach off the retroperitoneum.  The lesser sac had moderate adhesions to it but eventually freed it off to confirm that.  I helped mobilize the transverse colon mesentery off the retroperitoneum on the visceral peritoneum to help straighten it out.  I then elevated the transverse current anteriorly.  Could see the right and left middle colic pedicles.  I mobilized the transverse mesocolon off the retroperitoneum including the neck and body of the pancreas.  Eventually came  over and connected with the pancreatic head and duodenal dissection.  I was able to breach and come through the transverse mesocolon attachments on the pancreas into the lesser sac.  Confirmed that I was elevating transverse colon mesentery only after looking superiorly and inferiorly.  Again I placed the transverse colon on tension.  Skeletonized the middle colic pedicle vessels right and left-sided dominant and transected them using a vessel sealer.  With that I had complete transection of the colon to the distal transverse colon, distal to the tumor.  I transected the distal transverse colon mesentery radially toward the preselected region.  I also transected the mesentery in the distal ileum radially as well.  Because of the atypical anatomy as well as transection, I decided to confirm good blood supply on the remaining ends for anastomosis.  We asked anesthesia to dilute the indocyanine green (ICG) to 10 mL and inject 3 mL intravenously with IV flush.  I switched to the NIR fluorescence (Firefly mode) imaging window on the daVinci platform.  I was able to see good light green visualization of blood vessels with good perfusion of tissues, confirming good tissue perfusion of the transverse colon.  There was  a few centimeters of ischemia proximal to the mesenteric transection on the ileum.  Therefore I transected mesentery a little more proximally on the ileum 5 cm until I came to the well-perfused region.  .  I could isolate the pathology. When he went ahead and proceeded with transection.  I transected at the distal ileum with a robotic stapler.  Transected at the distal transverse colon with a robotic stapler.  We assured hemostasis.  We then ran the small bowel from the ileum to the right sided ligament of Treitz.  I then ran it back distally to allow the small bowel to rest in the right abdomen.  The mesentery now was splayed wide without any twisting or torsion or pedicle.  This allowed the distal ileum  to come out to the left upper quadrant quite easily with minimal tension.  The mesentery was splayed broadly and flat.  It seemed delayed best in a side to side classic antiperistaltic fashion.   I did a side-to-side stapled anastomosis of ileum to distal transverse colon using a 25m white load robotic stapler x 1 firings in an antiperistaltic fashion.  (Distal stump of ileum to distal transverse colon stump).  I confirmed good hemostasis of a patent side to side ileocolonic anastomosis.  I sewed the common staple channel wound with an absorbable suture ( 2-0 V-lock) in a running CBrewerfashion from each corner and meeting in the center.  I did meticulous inspection prove an airtight closure.  I protected the anastomosis line with an anterior omentopexy of greater omentum using V lock suture.  We did reinspection of the abdomen.  Hemostasis was good.   Ureters, retroperitoneum, and bowel uninjured.  The anastomosis looked healthy.   Did irrigation.  Hemostasis was good on the pancreatic head and elsewhere.  We placed the wound protector through the 160mport site after it was enlarged in a Pfannenstiel fashion.  Specimen removed without incident.   I closed the 12 mm stapler port site with a 0 Vicryl using a laparoscopic suture passer under direct visualization. Endoluminal gas was evacuated.  Ports & wound protector removed.  We changed gloves & redraped the patient per colon SSI prevention protocol.  We aspirated the antibiotic irrigation.  Hemostasis was good.  Sterile unused instruments were used from this point.  I closed the skin at the port sites using Monocryl stitch and sterile dressing.  I closed the extraction wound using a 0 Vicryl vertical peritoneal closure and a #1 PDS transverse anterior rectal fascial closure like a small Pfannenstiel closure. I closed the skin with some interrupted Monocryl stitches. I placed antibiotic-soaked wicks into the closure at the corners x2.  I placed sterile  dressings.     Next I focused on anorectal evaluation.  Patient was placed in high lithotomy.  Perineum had already been prepped and draped.  I did expection.  Findings as noted above.  He did have a punctate opening in the right anterior perirectal region about 3 cm from the anal verge.  Some thickness as well.  Suspicious for chronic abscess or irritation.  The opening was too small to probe.  I therefore did cautery around the opening and some phlegmon for a 15 mm button at the skin.  Then came through the dermis and the soft tissues.  He seemed to have a fistulous tract.  I transected through the tubular part of the wound.  I could probe a little bit but it only went approximately for 15 mm,  stopping at the sphincters.  I injected the tract as well with methylene blue.  Again the tract did not continue into the anal verge or internally.  No evidence of a true complete fistula.  I skeletonized that tract and followed it until it came to the sphincter complex near the anterior midline.  It tapered off and stopped.  No more opening.  I did ligate the apex of this tract using 2-0 Vicryl coronally and sagittally with interrupted figure-of-eight sutures to good result.  I did did hemorrhoidal ligation and pexy.  Right anterior hemorrhoidal pile.  2-0 Vicryl suture with figure-of-eight suturing at the rectum sick centimeters proximal anal verge in the right anterior column.  I ran that suture down all the way to the anal verge and tied that down.  That provided hemorrhoidal ligation and pexy.  Did another one anterior midline to help cover up the anal crypts although again confirmed no internal opening fistula.  He had prominent right posterior and left posterior hemorrhoidal piles.  Did ligations of those columns as well.  He had prolapsing anal crypt polyp and anal external hemorrhoid in the left lateral aspect.  I excised that and it biconcave fusiform fashion and left a 1 cm wide wound open.  I returned to  the right anterior wound.  I closed the anal verge with interrupted 2-0 chromic horizontal mattress sutures to help close the wound down to the anal verge.  I then opened up the external wound with some cautery to have a open 2 cm diameter wound.  Onlay gauze placed.  No deep packing done.  Patient is being extubated go to recovery room. I discussed postop care with the patient in detail the office & in the holding area. Instructions are written. I discussed operative findings, updated the patient's status, discussed probable steps to recovery, and gave postoperative recommendations to the patient's spouse.  Recommendations were made.  Questions were answered.  She expressed understanding & appreciation.  Adin Hector, M.D., F.A.C.S. Gastrointestinal and Minimally Invasive Surgery [facility] [address], [place], [place] [address] [phone] Main / Paging

## 2019-08-01 NOTE — H&P (Signed)
[name] [name]  DOB: 04/20/1950  Married / Language: Cleophus Molt / Race: White  Male   Patient Care Team: Corine Shelter, PA-C as PCP - General (Physician Assistant) Minus Breeding, MD as PCP - Cardiology (Cardiology) Jonnie Finner, RN as Oncology Nurse Navigator Michael Boston, MD as Consulting Physician (General Surgery) Minus Breeding, MD as Consulting Physician (Cardiology) Loletha Carrow Kirke Corin, MD as Consulting Physician (Gastroenterology) Truitt Merle, MD as Consulting Physician (Oncology)  `  `  Patient sent for surgical consultation at the request of Dr Wilfrid Lund, [facility]  Chief Complaint: Newly diagnosed colon cancer. Also history of perirectal abscess and possible fistula  `  `  The patient is a active smoking male with strong family history of colon polyps and cancer. Last colonoscopy in 2014. I believe there is a 3 year follow-up recommended for 2017 but that did not happen until this year. Obvious mass noted in the left-sided colon suspicious to be at the splenic flexure. Biopsy consistent with adenocarcinoma. Surgical consultation and medical oncology consult requested. He's had CT of the chest abdomen and pelvis done. CEA is less than 3. 1.6 cm lesion noted and left hepatic lobe. Suspicious for cancer. That just happened a few days ago. Patient also notes he had episode of perianal pain and swelling. Went to urgent care center. Hematoma suspected. Had worsening pain and returned to have a peritoneal abscess lanced in late December 2020. Some persistent intermittent drainage. He tried squeeze out. He had follow-up with Dr. Arvin Collard in [facility] about health system. They felt like everything is healed up. Patient notes that he has had some intermittent drainage. Concern for possible fistula noted on the colonoscopy as well. Surgery consultation requested to help address that as well.  Patient notes he moves his bowels usually on a daily basis. They have been somewhat irregular. He  has been on oral Cipro and Flagyl since the colonoscopy. I made his bowels regular. However he feels better. No swelling is gone markedly down. He really notices anything down there. He does smoke. He does have a history of myocardial infarction requiring stenting in 2010. Follow-up in Dr. Susy [place]. Had a underwhelming exercise tolerance test in 2017. He claims he can walk half hour easily. He's never had any abdominal surgeries. He is a diabetic but is just on oral metformin.  (Review of systems as stated in this history (HPI) or in the review of systems. Otherwise all other 12 point ROS are negative)  `  `  `  This patient encounter took 65 minutes today to perform the following: obtain history, perform exam, review outside records, interpret tests & imaging, counsel the patient on their diagnosis; and, document this encounter, including findings & plan in the electronic health record (EHR).  Past Surgical History Antonietta Jewel, [place]; 06/03/2019 3:30 PM)  Colon Polyp Removal - Colonoscopy  Diagnostic Studies History (Chanel Teressa Senter, CMA; 06/03/2019 3:30 PM)  Colonoscopy within last year  Allergies (Chanel Teressa Senter, CMA; 06/03/2019 3:31 PM)  Plavix *HEMATOLOGICAL AGENTS - MISC.*  Allergies Reconciled  Medication History (Chanel Teressa Senter, CMA; 06/03/2019 3:32 PM)  Simvastatin (40MG Tablet, Oral) Active.  metroNIDAZOLE (500MG Tablet, Oral) Active.  metFORMIN HCl (500MG Tablet, Oral) Active.  Lisinopril (10MG Tablet, Oral) Active.  Pantoprazole Sodium (40MG Tablet DR, Oral) Active.  B & C B-Complex (Oral) Active.  Nitroglycerin (0.4MG Tab Sublingual, Sublingual) Active.  Medications Reconciled  Social History Antonietta Jewel, CMA; 06/03/2019 3:30 PM)  Alcohol use Remotely quit alcohol use.  Caffeine  use Carbonated beverages, Tea.  Tobacco use Current every day smoker.  Family History Antonietta Jewel, [place]; 06/03/2019 3:30 PM)  Cancer Father.  Colon Cancer Mother.  Diabetes Mellitus Mother.  Other Problems (Chanel  Teressa Senter, [place]; 06/03/2019 3:30 PM)  Arthritis  Back Pain  Diabetes Mellitus  Gastroesophageal Reflux Disease  Hypercholesterolemia  Myocardial infarction  Review of Systems (Chanel Nolan CMA; 06/03/2019 3:30 PM)  General Not Present- Appetite Loss, Chills, Fatigue, Fever, Night Sweats, Weight Gain and Weight Loss.  Skin Present- Dryness. Not Present- Change in Wart/Mole, Hives, Jaundice, New Lesions, Non-Healing Wounds, Rash and Ulcer.  HEENT Not Present- Earache, Hearing Loss, Hoarseness, Nose Bleed, Oral Ulcers, Ringing in the Ears, Seasonal Allergies, Sinus Pain, Sore Throat, Visual Disturbances, Wears glasses/contact lenses and Yellow Eyes.  Respiratory Not Present- Bloody sputum, Chronic Cough, Difficulty Breathing, Snoring and Wheezing.  Breast Not Present- Breast Mass, Breast Pain, Nipple Discharge and Skin Changes.  Cardiovascular Not Present- Chest Pain, Difficulty Breathing Lying Down, Leg Cramps, Palpitations, Rapid Heart Rate, Shortness of Breath and Swelling of Extremities.  Gastrointestinal Present- Rectal Pain. Not Present- Abdominal Pain, Bloating, Bloody Stool, Change in Bowel Habits, Chronic diarrhea, Constipation, Difficulty Swallowing, Excessive gas, Gets full quickly at meals, Hemorrhoids, Indigestion, Nausea and Vomiting.  Musculoskeletal Present- Back Pain. Not Present- Joint Pain, Joint Stiffness, Muscle Pain, Muscle Weakness and Swelling of Extremities.  Neurological Not Present- Decreased Memory, Fainting, Headaches, Numbness, Seizures, Tingling, Tremor, Trouble walking and Weakness.  Psychiatric Not Present- Anxiety, Bipolar, Change in Sleep Pattern, Depression, Fearful and Frequent crying.  Endocrine Not Present- Cold Intolerance, Excessive Hunger, Hair Changes, Heat Intolerance, Hot flashes and New Diabetes.  Hematology Not Present- Blood Thinners, Easy Bruising, Excessive bleeding, Gland problems, HIV and Persistent Infections.  Vitals (Chanel Nolan CMA; 06/03/2019 3:33 PM)    06/03/2019 3:32 PM  Weight: 196 lb Height: 64 in  Body Surface Area: 1.94 m Body Mass Index: 33.64 kg/m  Temp.: 98.1 F Pulse: 90 (Regular)  BP: 130/68 (Sitting, Left Arm, Standard)  Physical Exam Adin Hector MD; 06/03/2019 3:58 PM)  General  Mental Status - Alert.  General Appearance - Not in acute distress, Not Sickly.  Orientation - Oriented X3.  Hydration - Well hydrated.  Voice - Normal.  Integumentary  Global Assessment  Upon inspection and palpation of skin surfaces of the - Axillae: non-tender, no inflammation or ulceration, no drainage. and Distribution of scalp and body hair is normal.  General Characteristics  Temperature - normal warmth is noted.  Note: Thickened skin diffusely. Somewhat dry. Some numerous actinic keratoses and seborrheic dermatitis  Head and Neck  Head - normocephalic, atraumatic with no lesions or palpable masses.  Face  Global Assessment - atraumatic, no absence of expression.  Neck  Global Assessment - no abnormal movements, no bruit auscultated on the right, no bruit auscultated on the left, no decreased range of motion, non-tender.  Trachea - midline.  Thyroid  Gland Characteristics - non-tender.  Eye  Eyeball - Left - Extraocular movements intact, No Nystagmus - Left.  Eyeball - Right - Extraocular movements intact, No Nystagmus - Right.  Cornea - Left - No Hazy - Left.  Cornea - Right - No Hazy - Right.  Sclera/Conjunctiva - Left - No scleral icterus, No Discharge - Left.  Sclera/Conjunctiva - Right - No scleral icterus, No Discharge - Right.  Pupil - Left - Direct reaction to light normal.  Pupil - Right - Direct reaction to light normal.  ENMT  Ears  Pinna -  Left - no drainage observed, no generalized tenderness observed. Pinna - Right - no drainage observed, no generalized tenderness observed.  Nose and Sinuses  External Inspection of the Nose - no destructive lesion observed. Inspection of the nares - Left - quiet respiration.  Inspection of the nares - Right - quiet respiration.  Mouth and Throat  Lips - Upper Lip - no fissures observed, no pallor noted. Lower Lip - no fissures observed, no pallor noted. Nasopharynx - no discharge present. Oral Cavity/Oropharynx - Tongue - no dryness observed. Oral Mucosa - no cyanosis observed. Hypopharynx - no evidence of airway distress observed.  Chest and Lung Exam  Inspection  Movements - Normal and Symmetrical. Accessory muscles - No use of accessory muscles in breathing.  Palpation  Palpation of the chest reveals - Non-tender.  Auscultation  Breath sounds - Normal and Clear.  Cardiovascular  Auscultation  Rhythm - Regular. Murmurs & Other Heart Sounds - Auscultation of the heart reveals - No Murmurs and No Systolic Clicks.  Abdomen  Inspection  Inspection of the abdomen reveals - No Visible peristalsis and No Abnormal pulsations. Umbilicus - No Bleeding, No Urine drainage.  Palpation/Percussion  Palpation and Percussion of the abdomen reveal - Soft, Non Tender, No Rebound tenderness, No Rigidity (guarding) and No Cutaneous hyperesthesia.  Note: Abdomen overweight but soft. Mild super umbilical diastases recti. No umbilical hernia. Soft. Nontender. Not distended. No guarding.  Male Genitourinary  Note: No inguinal hernias. Normal external genitalia. Epididymi, testes, and spermatic cords normal without any masses.  Rectal  Note: Perianal skin clear. There is subtle dimpling at the right anterior anal verge with a 1 cm cord that goes superficial in the sphincters to an anal crypt. Seems to correlate with the pictures and where he points for a chronic perirectal fistula. However there is no active abscess or drainage today.  No fissure. No external hemorrhoids. Normal sphincter tone. Tolerated digital and anoscopic exam. Grade 2 internal hemorrhoids left lateral and right posterior. Not actively bleeding. Prostate mildly enlarged but smooth. No discrete rectal masses.    Peripheral Vascular  Upper Extremity  Inspection - Left - No Cyanotic nailbeds - Left, Not Ischemic. Inspection - Right - No Cyanotic nailbeds - Right, Not Ischemic.  Neurologic  Neurologic evaluation reveals - normal attention span and ability to concentrate, able to name objects and repeat phrases. Appropriate fund of knowledge , normal sensation and normal coordination.  Mental Status  Affect - not angry, not paranoid.  Cranial Nerves - Normal Bilaterally.  Gait - Normal.  Neuropsychiatric  Mental status exam performed with findings of - able to articulate well with normal speech/language, rate, volume and coherence, thought content normal with ability to perform basic computations and apply abstract reasoning and no evidence of hallucinations, delusions, obsessions or homicidal/suicidal ideation.  Musculoskeletal  Global Assessment  Spine, Ribs and Pelvis - no instability, subluxation or laxity. Right Upper Extremity - no instability, subluxation or laxity.  Lymphatic  Head & Neck  General Head & Neck Lymphatics: Bilateral - Description - No Localized lymphadenopathy.  Axillary  General Axillary Region: Bilateral - Description - No Localized lymphadenopathy.  Femoral & Inguinal  Generalized Femoral & Inguinal Lymphatics: Left - Description - No Localized lymphadenopathy. Right - Description - No Localized lymphadenopathy.  Results Adin Hector MD; 06/03/2019 4:59 PM)  Procedures  Name  Value  Date  Hemorrhoids  Procedure  Other: Perianal skin clear. There is subtle dimpling at the right anterior anal verge with a  1 cm cord that goes superficial in the sphincters to an anal crypt. Seems to correlate with the pictures and where he points for a chronic perirectal fistula. However there is no active abscess or drainage today............Marland KitchenNo fissure. No external hemorrhoids. Normal sphincter tone. Tolerated digital and anoscopic exam. Grade 2 internal hemorrhoids left lateral and  right posterior. Not actively bleeding. Prostate mildly enlarged but smooth. No discrete rectal masses.  Performed: 06/03/2019 3:58 PM  Assessment & Plan Adin Hector MD; 06/03/2019 4:59 PM)  LIVER MASS, LEFT LOBE (R16.0)  Impression: "1.6 cm ill-defined low-density lesion in the medial segment left  liver, highly suspicious for metastatic disease. MRI abdomen with  and without contrast would likely prove helpful to further evaluate"  Current Plans  He will have a new lesion in her liver. Radiology recommends MRI to help characterize this to see if it is benign or metastatic disease. He may require liver biopsy.  Keep your appointment to see medical oncology Dr. Burr Medico.  She to have metastatic colon cancer, standard of care is to do chemotherapy to control this first before considering surgery.  He do have a story suspicious for a fistula. Hopefully it is just take abscess that is finally healed up. Take Augmentin antibiotic should you have a flareup until we can have a more definitive plan to manage your cancer.  MRI (eg, proton), abdomen; w/o contrast material(s), followed by w contrast material(s) and further sequences(74183)(ACR 0 )(DSN [id number])(G-Code G1004(MG)) - STAT (Clinical Scenarios: No content available for this procedure; ; Pt needs mri abdomen w/ w/o contrast to eval. 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 Mid transverse colon by CT scan. Biopsy consistent with adenocarcinoma.  Standard of care would be segmental colonic resection. Most likely would be an extended right hemicolectomy with ileal distal transverse anastomosis.  However the patient has a newly found liver lesion suspicious for metastatic disease. That needs to be worked up. If proven as a metastatic liver lesion, and standard care would be to do chemotherapy first.  Should he proceed with colectomy if the liver  lesion is benign, would plan robotic right hemicolectomy with ileal transverse colon anastomosis.  I would need cardiac clearance as the patient's a smoker followed by Dr. Percival Spanish. Had underwhelming stress test in 2017 but he still smokes and had coronary stent in 2010. His exercise tolerance seems to be pretty decent.  Current Plans  Pt Education - [facility] Colorectal Cancer (AT): discussed with patient and provided information.  ANAL FISTULA (K60.3)  Impression: Perirectal abscess drained in December with some intermittent drainage suspicious for fistula but not that impressive on exam today.  Quit smoking 10 minimize chance of recurrent infections.  Try and hold off on any surgical intervention on this at this time as he is asymptomatic and feeling better off antibiotics. Should he get some recurrent drainage or worsening symptoms, he may benefit from examination under anesthesia with probable fistulotomy versus lift repair if it is truly deeper. I think I would give him Augmentin antibiotics of refills for now until we can figure out what the course of his cancer treatment is. May be they would want fistulotomy before starting chemotherapy. If he has recurrent symptoms, it may come to that. If not, then hold off.  Current Plans  ANOSCOPY, DIAGNOSTIC (44034)  Started Amoxicillin-Pot Clavulanate 875-125 MG Oral Tablet, 1 (one) Tablet two times daily, #10, 5 days  starting 06/03/2019, Ref. x3.  Pt Education - [facility] Abscess/Fistula (AT): discussed with patient and provided information.  Pt Education - [facility] Good Bowel Health ([place])  TOBACCO ABUSE (Z72.0)  Impression: STOP SMOKING!  We talked to the patient about the dangers of smoking. We stressed that tobacco use dramatically increases the risk of peri-operative complications such as infection, tissue necrosis leaving to problems with incision/wound and organ healing, hernia, chronic pain, heart attack, stroke, DVT, pulmonary embolism, and death. We noted  there are programs in our community to help stop smoking. Information was available.  Current Plans  Pt Education - [facility] STOP SMOKING!  PRESENCE OF STENT IN CORONARY ARTERY IN PATIENT WITH CORONARY ARTERY DISEASE (I25.10)  Impression: History of coronary disease with stenting. No longer anticoagulated. He still smokes.  Should he need surgery, would like cardiac clearance. Most likely it would be okay since he has a decent exercise tolerance test in 2017. Agree with Dr. 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 We will tentatively schedule for robotic proximal hemicolectomy with ileal transverse intracorporeal anastomosis as long as there are no other concerns by medical oncology who is supposed to see him soon.   Adin Hector, MD, FACS, MASCRS Gastrointestinal and Minimally Invasive Surgery  [facility] [address], [place], [place] [address] [phone] Main / Paging [phone] Fax

## 2019-08-01 NOTE — Anesthesia Postprocedure Evaluation (Signed)
Anesthesia Post Note  Patient: Giani Betzold  Procedure(s) Performed: XI ROBOTIC EXTENDED RIGHT COLECTOMY, OMENTALPEXY, BILATERAL TAP BLOCKS; ASSESSMENT OF TISSUE USING FIREFLY FLUORESCENCE; ANAL FISTULA REPAIR, HEMORRHOID LIGATION AND PEXY (N/A Abdomen)     Patient location during evaluation: PACU Anesthesia Type: General Level of consciousness: awake and alert Pain management: pain level controlled Vital Signs Assessment: post-procedure vital signs reviewed and stable Respiratory status: spontaneous breathing, nonlabored ventilation, respiratory function stable and patient connected to nasal cannula oxygen Cardiovascular status: blood pressure returned to baseline and stable Postop Assessment: no apparent nausea or vomiting Anesthetic complications: no    Last Vitals:  Vitals:   08/01/19 1930 08/01/19 1939  BP: (!) 155/71 (!) 148/66  Pulse: 82 70  Resp: 11 10  Temp:    SpO2: 97% 100%    Last Pain:  Vitals:   08/01/19 1930  TempSrc:   PainSc: Asleep                 Effie Berkshire

## 2019-08-01 NOTE — Anesthesia Preprocedure Evaluation (Addendum)
Anesthesia Evaluation  Patient identified by MRN, date of birth, ID band Patient awake    Reviewed: Allergy & Precautions, NPO status , Patient's Chart, lab work & pertinent test results  Airway Mallampati: II  TM Distance: <3 FB Neck ROM: Full    Dental  (+) Edentulous Upper, Edentulous Lower   Pulmonary COPD, Current Smoker,     + decreased breath sounds      Cardiovascular hypertension, Pt. on medications + CAD and + Cardiac Stents   Rhythm:Regular Rate:Normal     Neuro/Psych    GI/Hepatic Neg liver ROS, PUD, GERD  Medicated,  Endo/Other  diabetes, Type 2, Oral Hypoglycemic Agents  Renal/GU negative Renal ROS     Musculoskeletal  (+) Arthritis ,   Abdominal (+) + obese,   Peds  Hematology negative hematology ROS (+)   Anesthesia Other Findings   Reproductive/Obstetrics                           Anesthesia Physical Anesthesia Plan  ASA: III  Anesthesia Plan: General   Post-op Pain Management:    Induction: Intravenous  PONV Risk Score and Plan: 2 and Ondansetron and Treatment may vary due to age or medical condition  Airway Management Planned: Oral ETT  Additional Equipment:   Intra-op Plan:   Post-operative Plan: Extubation in OR  Informed Consent:   Plan Discussed with: CRNA  Anesthesia Plan Comments: (2 IV's)       Anesthesia Quick Evaluation

## 2019-08-01 NOTE — Transfer of Care (Signed)
Immediate Anesthesia Transfer of Care Note  Patient: [name] [name]  Procedure(s) Performed: XI ROBOT EXTENDED PROXIMAL HEMICOLECTOMY, OMENTALPEXY, BILATERAL TAP BLOCKS; ASSESSMENT OF TISSUE USING FIREFLY FLUORESCENCE; ANAL FISTULA REPAIR, HEMORRHOID LIGATION AND PEXY (N/A Abdomen)  Patient Location: PACU  Anesthesia Type:General  Level of Consciousness: awake, alert , oriented and patient cooperative  Airway & Oxygen Therapy: Patient Spontanous Breathing and Patient connected to face mask  Post-op Assessment: Report given to RN and Post -op Vital signs reviewed and stable  Post vital signs: Reviewed and stable  Last Vitals:  Vitals Value Taken Time  BP 184/99 08/01/19 1839  Temp    Pulse 93 08/01/19 1841  Resp 12 08/01/19 1841  SpO2 99 % 08/01/19 1841  Vitals shown include unvalidated device data.  Last Pain:  Vitals:   08/01/19 1200  TempSrc:   PainSc: 0-No pain      Patients Stated Pain Goal: 3 (30/05/11 0211)  Complications: No apparent anesthesia complications

## 2019-08-02 ENCOUNTER — Other Ambulatory Visit: Payer: Self-pay

## 2019-08-02 DIAGNOSIS — K642 Third degree hemorrhoids: Secondary | ICD-10-CM

## 2019-08-02 LAB — CBC
HCT: 40 % (ref 39.0–52.0)
Hemoglobin: 13.7 g/dL (ref 13.0–17.0)
MCH: 34 pg (ref 26.0–34.0)
MCHC: 34.3 g/dL (ref 30.0–36.0)
MCV: 99.3 fL (ref 80.0–100.0)
Platelets: 140 10*3/uL — ABNORMAL LOW (ref 150–400)
RBC: 4.03 MIL/uL — ABNORMAL LOW (ref 4.22–5.81)
RDW: 12.8 % (ref 11.5–15.5)
WBC: 13.4 10*3/uL — ABNORMAL HIGH (ref 4.0–10.5)
nRBC: 0 % (ref 0.0–0.2)

## 2019-08-02 LAB — BASIC METABOLIC PANEL
Anion gap: 9 (ref 5–15)
BUN: 10 mg/dL (ref 8–23)
CO2: 23 mmol/L (ref 22–32)
Calcium: 8.3 mg/dL — ABNORMAL LOW (ref 8.9–10.3)
Chloride: 106 mmol/L (ref 98–111)
Creatinine, Ser: 0.98 mg/dL (ref 0.61–1.24)
GFR calc Af Amer: >60 mL/min (ref >60)
GFR calc non Af Amer: >60 mL/min (ref >60)
Glucose, Bld: 131 mg/dL — ABNORMAL HIGH (ref 70–99)
Potassium: 4.1 mmol/L (ref 3.5–5.1)
Sodium: 138 mmol/L (ref 135–145)

## 2019-08-02 LAB — MAGNESIUM: Magnesium: 1.6 mg/dL — ABNORMAL LOW (ref 1.7–2.4)

## 2019-08-02 MED ORDER — MAGNESIUM SULFATE 2 GM/50ML IV SOLN
2.0000 g | Freq: Once | INTRAVENOUS | Status: AC
  Administered 2019-08-02: 2 g via INTRAVENOUS
  Filled 2019-08-02: qty 50

## 2019-08-02 MED ORDER — WITCH HAZEL-GLYCERIN EX PADS: 1.0000 "application " | MEDICATED_PAD | CUTANEOUS | Status: DC | PRN

## 2019-08-02 MED ORDER — HYDROCORT-PRAMOXINE (PERIANAL) 2.5-1 % EX CREA
1.0000 "application " | TOPICAL_CREAM | Freq: Four times a day (QID) | CUTANEOUS | Status: DC | PRN
  Filled 2019-08-02: qty 30

## 2019-08-02 NOTE — Progress Notes (Addendum)
[name] [name] [id number] [birth year]  CARE TEAM:  PCP: Corine Shelter, PA-C  Outpatient Care Team: Patient Care Team: Corine Shelter, PA-C as PCP - General (Physician Assistant) Minus Breeding, MD as PCP - Cardiology (Cardiology) Jonnie Finner, RN as Oncology Nurse Navigator Michael Boston, MD as Consulting Physician (General Surgery) Minus Breeding, MD as Consulting Physician (Cardiology) Loletha Carrow Kirke Corin, MD as Consulting Physician (Gastroenterology) Truitt Merle, MD as Consulting Physician (Oncology)  Inpatient Treatment Team: Treatment Team: Attending Provider: Michael Boston, MD; Registered Nurse: Consuela Mimes, RN; Registered Nurse: Lottie Dawson, RN; Technician: Suzanna Obey, NT   Problem List:   Principal Problem:   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[facility]) Active Problems:   Pure hypercholesterolemia   Essential hypertension   Steatohepatitis, nonalcoholic   Tobacco abuse   Perirectal fistula s/p repair   Prolapsed internal hemorrhoids, grade 3   1 Day Post-Op  08/01/2019  POST-OPERATIVE DIAGNOSIS:   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  Stop IV fluids.  Advance diet.  Mobilize.  Check ortho VS  Corrected hypomagnesemia.  Hypertension control. - Hold if BP low (OK so far)  DC'd Foley on postop day 2 given anorectal surgery.  Sitz bath and pain medicine as needed.    Follow-up on pathology.  -VTE prophylaxis- SCDs, etc  -mobilize as tolerated to help recovery  25 minutes spent in review, evaluation, examination, counseling, and coordination of  care.  More than 50% of that time was spent in counseling.  I updated the patient's status to the patient, spouse, and nurse.  Recommendations were made.  Questions were answered.  They expressed understanding & appreciation.   08/02/2019    Subjective: (Chief complaint)  Nausea & lightheadeness mild  Feels much better today  Wife & RN at bedside  Objective:  Vital signs:  Vitals:   08/01/19 2000 08/02/19 0300 08/02/19 0549 08/02/19 0700  BP: (!) 159/72  129/60   Pulse: 71  85   Resp: 14  16   Temp: 97.9 F (36.6 C)  98.4 F (36.9 C)   TempSrc:   Oral   SpO2: 100%  97%   Weight:  88.5 kg  89.3 kg  Height:  5' 4"  (1.626 m)      Last BM Date: 07/31/19  Intake/Output   Yesterday:  04/29 0701 - 04/30 0700 In: 0867 [P.O.:240; I.V.:3450; IV Piggyback:100] Out: 1300 [Urine:1200; Blood:100] This shift:  No intake/output data recorded.  Bowel function:  Flatus: YES  BM:  YES  Drain: (No drain)   Physical Exam:  General: Pt awake/alert in no acute distress Eyes: PERRL, normal EOM.  Sclera clear.  No icterus Neuro: CN II-XII intact w/o focal sensory/motor deficits. Lymph: No head/neck/groin lymphadenopathy Psych:  No delerium/psychosis/paranoia.  Oriented x 4 HENT: Normocephalic, Mucus membranes moist.  No thrush Neck: Supple, No tracheal deviation.  No obvious thyromegaly Chest: No pain to chest wall compression.  Good respiratory excursion.  No audible wheezing CV:  Pulses intact.  Regular rhythm.  No major extremity edema MS: Normal AROM mjr joints.  No obvious deformity  Abdomen: Soft.  Nondistended.  Mildly tender at incisions only.  No evidence of peritonitis.  No incarcerated hernias.  Ext:  No deformity.  No mjr edema.  No cyanosis Skin: No petechiae / purpurea.  No major sores.  Warm and dry    Results:   Cultures: Recent Results (from the past 720 hour(s))  SARS CORONAVIRUS 2 (TAT 6-24 HRS) Nasopharyngeal Nasopharyngeal Swab     Status: None    Collection Time: 07/29/19  1:02 PM   Specimen: Nasopharyngeal Swab  Result Value Ref Range Status   SARS Coronavirus 2 NEGATIVE NEGATIVE Final    Comment: (NOTE) SARS-CoV-2 target nucleic acids are NOT DETECTED. The SARS-CoV-2 RNA is generally detectable in upper and lower respiratory specimens during the acute phase of infection. Negative results do not preclude SARS-CoV-2 infection, do not rule out co-infections with other pathogens, and should not be used as the sole basis for treatment or other patient management decisions. Negative results must be combined with clinical observations, patient history, and epidemiological information. The expected result is Negative. Fact Sheet for Patients: SugarRoll.be Fact Sheet for Healthcare Providers: https://www.woods-mathews.com/ This test is not yet approved or cleared by the [place] FDA and  has been authorized for detection and/or diagnosis of SARS-CoV-2 by FDA under an Emergency Use Authorization (EUA). This EUA will remain  in effect (meaning this test can be used) for the duration of the COVID-19 declaration under Section 56 4(b)(1) of the Act, 21 U.S.C. section 360bbb-3(b)(1), unless the authorization is terminated or revoked sooner. Performed at [facility], [place] [address], [place], [place] [address]     Labs: Results for orders placed or performed during the hospital encounter of 08/01/19 (from the past 48 hour(s))  Glucose, capillary     Status: Abnormal   Collection Time: 08/01/19 11:02 AM  Result Value Ref Range   Glucose-Capillary 158 (H) 70 - 99 mg/dL    Comment: Glucose reference range applies only to samples taken after fasting for at least 8 hours.  Type and screen [place]     Status: None   Collection Time: 08/01/19 12:50 PM  Result Value Ref Range   ABO/RH(D) O POS    Antibody Screen NEG    Sample Expiration       08/04/2019,2359 Performed at [facility], [place] [address], [place],  [address]   ABO/Rh     Status: None   Collection Time: 08/01/19 12:50 PM  Result Value Ref Range   ABO/RH(D)      O POS Performed at [facility], [place] [address], [place],  [address]   Glucose, capillary     Status: Abnormal   Collection Time: 08/01/19  6:40 PM  Result Value Ref Range   Glucose-Capillary 143 (H) 70 - 99 mg/dL    Comment: Glucose reference range applies only to samples taken after fasting for at least 8 hours.  Basic metabolic panel     Status: Abnormal   Collection Time: 08/02/19  5:02 AM  Result Value Ref Range   Sodium 138 135 - 145 mmol/L   Potassium 4.1 3.5 - 5.1 mmol/L   Chloride 106 98 - 111 mmol/L   CO2 23 22 - 32 mmol/L   Glucose, Bld 131 (H) 70 - 99 mg/dL    Comment: Glucose reference range applies only to samples taken after fasting for at least 8 hours.   BUN 10 8 - 23 mg/dL   Creatinine, Ser 0.98 0.61 - 1.24 mg/dL   Calcium 8.3 (L) 8.9 -  10.3 mg/dL   GFR calc non Af Amer >60 >60 mL/min   GFR calc Af Amer >60 >60 mL/min   Anion gap 9 5 - 15    Comment: Performed at [facility], [place] [address], [place], [place] [address]  CBC     Status: Abnormal   Collection Time: 08/02/19  5:02 AM  Result Value Ref Range   WBC 13.4 (H) 4.0 - 10.5 K/uL   RBC 4.03 (L) 4.22 - 5.81 MIL/uL   Hemoglobin 13.7 13.0 - 17.0 g/dL   HCT 40.0 39.0 - 52.0 %   MCV 99.3 80.0 - 100.0 fL   MCH 34.0 26.0 - 34.0 pg   MCHC 34.3 30.0 - 36.0 g/dL   RDW 12.8 11.5 - 15.5 %   Platelets 140 (L) 150 - 400 K/uL   nRBC 0.0 0.0 - 0.2 %    Comment: Performed at [facility], [place] [address] [place], [place] [address]  Magnesium     Status: Abnormal   Collection Time: 08/02/19  5:02 AM  Result Value Ref Range   Magnesium 1.6 (L) 1.7 - 2.4 mg/dL    Comment: Performed at [facility], [place] [address]  [place], [place] [address]    Imaging / Studies: No results found.  Medications / Allergies: per chart  Antibiotics: Anti-infectives (From admission, onward)   Start     Dose/Rate Route Frequency Ordered Stop   08/01/19 2200  cefoTEtan (CEFOTAN) 2 g in sodium chloride 0.9 % 100 mL IVPB     2 g 200 mL/hr over 30 Minutes Intravenous Every 12 hours 08/01/19 2039 08/01/19 2220   08/01/19 1400  neomycin (MYCIFRADIN) tablet 1,000 mg  Status:  Discontinued     1,000 mg Oral 3 times per day 08/01/19 1049 08/01/19 1950   08/01/19 1400  metroNIDAZOLE (FLAGYL) tablet 1,000 mg  Status:  Discontinued     1,000 mg Oral 3 times per day 08/01/19 1049 08/01/19 1950   08/01/19 1100  cefoTEtan (CEFOTAN) 2 g in sodium chloride 0.9 % 100 mL IVPB     2 g 200 mL/hr over 30 Minutes Intravenous On call to O.R. 08/01/19 1049 08/01/19 1341        Note: Portions of this report may have been transcribed using voice recognition software. Every effort was made to ensure accuracy; however, inadvertent computerized transcription errors may be present.   Any transcriptional errors that result from this process are unintentional.     Adin Hector, MD, FACS, MASCRS Gastrointestinal and Minimally Invasive Surgery    [address], [place], [place] [address] [phone] Main / Paging [phone] Fax Please see Amion for pager number, especial 5pm - 7am.

## 2019-08-03 NOTE — Progress Notes (Signed)
Pharmacy Brief Note - Alvimopan (Entereg)  The standing order set for alvimopan (Entereg) now includes an automatic order to discontinue the drug after the patient has had a bowel movement. The change was approved by the [facility] and the Medical Executive Committee.   This patient has had bowel movements documented by nursing. Therefore, alvimopan has been discontinued. If there are questions, please contact the pharmacy at [phone].   Thank you-  Dia Sitter, PharmD, BCPS 08/03/2019 1:08 PM

## 2019-08-04 MED ORDER — TRAMADOL HCL 50 MG PO TABS: 50.0000 mg | ORAL_TABLET | Freq: Four times a day (QID) | ORAL | 0 refills | Status: DC | PRN

## 2019-08-04 NOTE — Discharge Summary (Signed)
Physician Discharge Summary    Patient ID: [name] [name] MRN: [id number] DOB/AGE: Mar 02, 1951  69 y.o.  Patient Care Team: Corine Shelter, PA-C as PCP - General (Physician Assistant) Minus Breeding, MD as PCP - Cardiology (Cardiology) Jonnie Finner, RN as Oncology Nurse Navigator Michael Boston, MD as Consulting Physician (General Surgery) Minus Breeding, MD as Consulting Physician (Cardiology) Loletha Carrow Kirke Corin, MD as Consulting Physician (Gastroenterology) Truitt Merle, MD as Consulting Physician (Oncology)  Admit date: 08/01/2019  Discharge date: 08/04/2019  Hospital Stay = 3 days    Discharge Diagnoses:  Principal Problem:   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[facility]) Active Problems:   Pure hypercholesterolemia   Essential hypertension   Steatohepatitis, nonalcoholic   Tobacco abuse   Perirectal fistula s/p repair   Prolapsed internal hemorrhoids, grade 3   3 Days Post-Op  08/01/2019  POST-OPERATIVE DIAGNOSIS:  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  Postoperatively, the patient gradually mobilized and advanced to a solid diet.  Pain and other symptoms were treated aggressively.    By the time of discharge, the patient was walking well the hallways, eating food, having flatus.  Pain was well-controlled on an oral medications.  Based on meeting discharge criteria and continuing to recover, I felt it was safe for the patient to be discharged from the hospital to further recover with close followup. Postoperative recommendations were discussed in detail.  They are written as well.  Discharged Condition:  good  Discharge Exam: Blood pressure (!) 157/74, pulse 91, temperature 98.3 F (36.8 C), temperature source Oral, resp. rate 19, height 5' 4"  (1.626 m), weight 91.2 kg, SpO2 95 %.  General: Pt awake/alert/oriented x4 in No acute distress Eyes: PERRL, normal EOM.  Sclera clear.  No icterus Neuro: CN II-XII intact w/o focal sensory/motor deficits. Lymph: No head/neck/groin lymphadenopathy Psych:  No delerium/psychosis/paranoia HENT: Normocephalic, Mucus membranes moist.  No thrush Neck: Supple, No tracheal deviation Chest: No chest wall pain w good excursion CV:  Pulses intact.  Regular rhythm MS: Normal AROM mjr joints.  No obvious deformity Abdomen: Soft.  Nondistended.  Mildly tender at incisions only.  No evidence of peritonitis.  No incarcerated hernias. Ext:  SCDs BLE.  No mjr edema.  No cyanosis Skin: No petechiae / purpura   Disposition:   Follow-up Information    Michael Boston, MD. Schedule an appointment as soon as possible for a visit in 3 weeks.   Specialty: General Surgery Contact information: [address] [place] [address] [phone]           Discharge disposition: 01-Home or Self Care       Discharge Instructions    Call MD for:   Complete by: As directed    FEVER > 101.5 F  (temperatures < 101.5 F are not significant)   Call MD for:   Complete by: As directed    FEVER > 101.5 F  (temperatures < 101.5 F are not significant)   Call MD for:  extreme fatigue   Complete by: As directed    Call MD for:  extreme fatigue   Complete by: As directed    Call MD for:  persistant dizziness or light-headedness  Complete by: As directed    Call MD for:  persistant dizziness or light-headedness   Complete by: As directed    Call MD for:  persistant nausea and vomiting   Complete by: As directed    Call MD for:  persistant nausea and vomiting   Complete by: As directed    Call MD for:  redness, tenderness, or signs of infection (pain,  swelling, redness, odor or green/yellow discharge around incision site)   Complete by: As directed    Call MD for:  redness, tenderness, or signs of infection (pain, swelling, redness, odor or green/yellow discharge around incision site)   Complete by: As directed    Call MD for:  severe uncontrolled pain   Complete by: As directed    Call MD for:  severe uncontrolled pain   Complete by: As directed    Diet - low sodium heart healthy   Complete by: As directed    Start with a bland diet such as soups, liquids, starchy foods, low fat foods, etc. the first few days at home. Gradually advance to a solid, low-fat, high fiber diet by the end of the first week at home.   Add a fiber supplement to your diet (Metamucil, etc) If you feel full, bloated, or constipated, stay on a full liquid or pureed/blenderized diet for a few days until you feel better and are no longer constipated.   Discharge instructions   Complete by: As directed    See Discharge Instructions If you are not getting better after two weeks or are noticing you are getting worse, contact our office [phone] for further advice.  We may need to adjust your medications, re-evaluate you in the office, send you to the emergency room, or see what other things we can do to help. The clinic staff is available to answer your questions during regular business hours (8:30am-5pm).  Please don't hesitate to call and ask to speak to one of our nurses for clinical concerns.    A surgeon from [facility] is always on call at the hospitals 24 hours/day If you have a medical emergency, go to the nearest emergency room or call 911.   Discharge instructions   Complete by: As directed    See Discharge Instructions If you are not getting better after two weeks or are noticing you are getting worse, contact our office [phone] for further advice.  We may need to adjust your medications, re-evaluate you in the office, send you to the  emergency room, or see what other things we can do to help. The clinic staff is available to answer your questions during regular business hours (8:30am-5pm).  Please don't hesitate to call and ask to speak to one of our nurses for clinical concerns.    A surgeon from [facility] is always on call at the hospitals 24 hours/day If you have a medical emergency, go to the nearest emergency room or call 911.   Discharge wound care:   Complete by: As directed    It is good for closed incisions and even open wounds to be washed every day.  Shower every day.  Short baths are fine.  Wash the incisions and wounds clean with soap & water.    You may leave closed incisions open to air if it is dry.   You may cover the incision with clean gauze & replace it after your daily shower for comfort.   Discharge wound care:  Complete by: As directed    It is good for closed incisions and even open wounds to be washed every day.  Shower every day.  Short baths are fine.  Wash the incisions and wounds clean with soap & water.    You may leave closed incisions open to air if it is dry.   You may cover the incision with clean gauze & replace it after your daily shower for comfort.  Remove all your dressings on Monday morning, postoperative day #3.  08/05/2019   Driving Restrictions   Complete by: As directed    You may drive when: - you are no longer taking narcotic prescription pain medication - you can comfortably wear a seatbelt - you can safely make sudden turns/stops without pain.   Driving Restrictions   Complete by: As directed    You may drive when: - you are no longer taking narcotic prescription pain medication - you can comfortably wear a seatbelt - you can safely make sudden turns/stops without pain.   Increase activity slowly   Complete by: As directed    Start light daily activities --- self-care, walking, climbing stairs- beginning the day after surgery.  Gradually increase activities  as tolerated.  Control your pain to be active.  Stop when you are tired.  Ideally, walk several times a day, eventually an hour a day.   Most people are back to most day-to-day activities in a few weeks.  It takes 4-6 weeks to get back to unrestricted, intense activity. If you can walk 30 minutes without difficulty, it is safe to try more intense activity such as jogging, treadmill, bicycling, low-impact aerobics, swimming, etc. Save the most intensive and strenuous activity for last (Usually 4-8 weeks after surgery) such as sit-ups, heavy lifting, contact sports, etc.  Refrain from any intense heavy lifting or straining until you are off narcotics for pain control.  You will have off days, but things should improve week-by-week. DO NOT PUSH THROUGH PAIN.  Let pain be your guide: If it hurts to do something, don't do it.   Increase activity slowly   Complete by: As directed    Start light daily activities --- self-care, walking, climbing stairs- beginning the day after surgery.  Gradually increase activities as tolerated.  Control your pain to be active.  Stop when you are tired.  Ideally, walk several times a day, eventually an hour a day.   Most people are back to most day-to-day activities in a few weeks.  It takes 4-6 weeks to get back to unrestricted, intense activity. If you can walk 30 minutes without difficulty, it is safe to try more intense activity such as jogging, treadmill, bicycling, low-impact aerobics, swimming, etc. Save the most intensive and strenuous activity for last (Usually 4-8 weeks after surgery) such as sit-ups, heavy lifting, contact sports, etc.  Refrain from any intense heavy lifting or straining until you are off narcotics for pain control.  You will have off days, but things should improve week-by-week. DO NOT PUSH THROUGH PAIN.  Let pain be your guide: If it hurts to do something, don't do it.   Lifting restrictions   Complete by: As directed    If you can walk 30  minutes without difficulty, it is safe to try more intense activity such as jogging, treadmill, bicycling, low-impact aerobics, swimming, etc. Save the most intensive and strenuous activity for last (Usually 4-8 weeks after surgery) such as sit-ups, heavy lifting, contact sports, etc.   Refrain  from any intense heavy lifting or straining until you are off narcotics for pain control.  You will have off days, but things should improve week-by-week. DO NOT PUSH THROUGH PAIN.  Let pain be your guide: If it hurts to do something, don't do it.  Pain is your body warning you to avoid that activity for another week until the pain goes down.   Lifting restrictions   Complete by: As directed    If you can walk 30 minutes without difficulty, it is safe to try more intense activity such as jogging, treadmill, bicycling, low-impact aerobics, swimming, etc. Save the most intensive and strenuous activity for last (Usually 4-8 weeks after surgery) such as sit-ups, heavy lifting, contact sports, etc.   Refrain from any intense heavy lifting or straining until you are off narcotics for pain control.  You will have off days, but things should improve week-by-week. DO NOT PUSH THROUGH PAIN.  Let pain be your guide: If it hurts to do something, don't do it.  Pain is your body warning you to avoid that activity for another week until the pain goes down.   May shower / Bathe   Complete by: As directed    May shower / Bathe   Complete by: As directed    May walk up steps   Complete by: As directed    May walk up steps   Complete by: As directed    Remove dressing in 72 hours   Complete by: As directed    Make sure all dressings are removed by the third day after surgery.  Leave incisions open to air.  OK to cover incisions with gauze or bandages as desired   Remove dressing in 72 hours   Complete by: As directed    Remove all your dressings on Monday morning, postoperative day #3.  08/05/2019   Leave incisions open to  air.  OK to cover incisions with gauze or bandages as desired   Sexual Activity Restrictions   Complete by: As directed    You may have sexual intercourse when it is comfortable. If it hurts to do something, stop.   Sexual Activity Restrictions   Complete by: As directed    You may have sexual intercourse when it is comfortable. If it hurts to do something, stop.      Allergies as of 08/04/2019      Reactions   Clopidogrel Bisulfate Hives   Plavix="hives"      Medication List    TAKE these medications   aspirin EC 81 MG tablet Take 81 mg by mouth every evening.   b complex vitamins tablet Take 1 tablet by mouth every evening.   lisinopril 10 MG tablet Commonly known as: ZESTRIL TAKE 1 TABLET BY MOUTH EVERY DAY What changed:   how much to take  how to take this  when to take this   metFORMIN 500 MG tablet Commonly known as: GLUCOPHAGE Take 500 mg by mouth every evening.   naproxen sodium 220 MG tablet Commonly known as: ALEVE Take 220-440 mg by mouth 2 (two) times daily as needed (pain.).   nitroGLYCERIN 0.4 MG SL tablet Commonly known as: NITROSTAT Place 1 tablet (0.4 mg total) under the tongue every 5 (five) minutes as needed.   pantoprazole 40 MG tablet Commonly known as: PROTONIX Take 40 mg by mouth every other day. In the evening.   simvastatin 40 MG tablet Commonly known as: ZOCOR TAKE 1 TABLET (40 MG TOTAL) BY MOUTH  AT BEDTIME. What changed: See the new instructions.   traMADol 50 MG tablet Commonly known as: ULTRAM Take 1-2 tablets (50-100 mg total) by mouth every 6 (six) hours as needed for moderate pain or severe pain (mild pain).   vitamin B-12 1000 MCG tablet Commonly known as: CYANOCOBALAMIN Take 1,000 mcg by mouth every evening.            Discharge Care Instructions  (From admission, onward)         Start     Ordered   08/04/19 0000  Discharge wound care:    Comments: It is good for closed incisions and even open wounds to be  washed every day.  Shower every day.  Short baths are fine.  Wash the incisions and wounds clean with soap & water.    You may leave closed incisions open to air if it is dry.   You may cover the incision with clean gauze & replace it after your daily shower for comfort.  Remove all your dressings on Monday morning, postoperative day #3.  08/05/2019   08/04/19 0801   08/01/19 0000  Discharge wound care:    Comments: It is good for closed incisions and even open wounds to be washed every day.  Shower every day.  Short baths are fine.  Wash the incisions and wounds clean with soap & water.    You may leave closed incisions open to air if it is dry.   You may cover the incision with clean gauze & replace it after your daily shower for comfort.   08/01/19 1824          Significant Diagnostic Studies:  Results for orders placed or performed during the hospital encounter of 08/01/19 (from the past 72 hour(s))  Glucose, capillary     Status: Abnormal   Collection Time: 08/01/19 11:02 AM  Result Value Ref Range   Glucose-Capillary 158 (H) 70 - 99 mg/dL    Comment: Glucose reference range applies only to samples taken after fasting for at least 8 hours.  Type and screen [place]     Status: None   Collection Time: 08/01/19 12:50 PM  Result Value Ref Range   ABO/RH(D) O POS    Antibody Screen NEG    Sample Expiration      08/04/2019,2359 Performed at [facility], [place] [address], [place], [place] [address]   ABO/Rh     Status: None   Collection Time: 08/01/19 12:50 PM  Result Value Ref Range   ABO/RH(D)      O POS Performed at [facility], [place] [address] [place], [place] [address]   Glucose, capillary     Status: Abnormal   Collection Time: 08/01/19  6:40 PM  Result Value Ref Range   Glucose-Capillary 143 (H) 70 - 99 mg/dL    Comment: Glucose reference range applies only to samples taken after fasting for at least 8 hours.    Basic metabolic panel     Status: Abnormal   Collection Time: 08/02/19  5:02 AM  Result Value Ref Range   Sodium 138 135 - 145 mmol/L   Potassium 4.1 3.5 - 5.1 mmol/L   Chloride 106 98 - 111 mmol/L   CO2 23 22 - 32 mmol/L   Glucose, Bld 131 (H) 70 - 99 mg/dL    Comment: Glucose reference range applies only to samples taken after fasting for at least 8 hours.   BUN 10 8 -  23 mg/dL   Creatinine, Ser 0.98 0.61 - 1.24 mg/dL   Calcium 8.3 (L) 8.9 - 10.3 mg/dL   GFR calc non Af Amer >60 >60 mL/min   GFR calc Af Amer >60 >60 mL/min   Anion gap 9 5 - 15    Comment: Performed at [facility], [place] [address] [place], [place] [address]  CBC     Status: Abnormal   Collection Time: 08/02/19  5:02 AM  Result Value Ref Range   WBC 13.4 (H) 4.0 - 10.5 K/uL   RBC 4.03 (L) 4.22 - 5.81 MIL/uL   Hemoglobin 13.7 13.0 - 17.0 g/dL   HCT 40.0 39.0 - 52.0 %   MCV 99.3 80.0 - 100.0 fL   MCH 34.0 26.0 - 34.0 pg   MCHC 34.3 30.0 - 36.0 g/dL   RDW 12.8 11.5 - 15.5 %   Platelets 140 (L) 150 - 400 K/uL   nRBC 0.0 0.0 - 0.2 %    Comment: Performed at [facility], [place] [address] [place] [address], [place], [place] [address]  Magnesium     Status: Abnormal   Collection Time: 08/02/19  5:02 AM  Result Value Ref Range   Magnesium 1.6 (L) 1.7 - 2.4 mg/dL    Comment: Performed at [facility], [place] [address], [place],  [address]    No results found.  Past Medical History:  Diagnosis Date  . Arthritis   . Colon cancer ([place])   . COPD (chronic obstructive pulmonary disease) (H[facility])    mild per pt.  . Coronary artery disease    cardiac catheterization on January 26,2010, right coronary arter distal occlusion with stenting of this with xience drug-eluting stent.  Pt also had liminal irregularities  in the LAD and 40-50% mid circumflex  stenosis  . Diabetes mellitus without complication ([place])   . Dyspnea    exersion only  . GERD (gastroesophageal reflux disease)    . History of colon polyps   . History of kidney stones   . Hyperlipidemia   . Hypertension   . Myocardial infarction ([place]) 2010  . Perirectal abscess   . Pneumonia 2013  . Stomach ulcer 1970's    Past Surgical History:  Procedure Laterality Date  . COLONOSCOPY    . CORONARY ANGIOPLASTY WITH STENT PLACEMENT  2010  . POLYPECTOMY      Social History   Socioeconomic History  . Marital status: Married    Spouse name: Not on file  . Number of children: 3  . Years of education: Not on file  . Highest education level: Not on file  Occupational History  . Occupation: Carpenter  Tobacco Use  . Smoking status: Current Every Day Smoker    Packs/day: 1.00    Years: 55.00    Pack years: 55.00    Types: Cigarettes  . Smokeless tobacco: Former Network engineer and Sexual Activity  . Alcohol use: Not Currently  . Drug use: No  . Sexual activity: Not on file  Other Topics Concern  . Not on file  Social History Narrative  . Not on file   Social Determinants of Health   Financial Resource Strain:   . Difficulty of Paying Living Expenses:   Food Insecurity:   . Worried About Charity fundraiser in the Last Year:   . Arboriculturist in the Last Year:   Transportation Needs:   . Film/video editor (Medical):   Marland Kitchen Lack of Transportation (  Non-Medical):   Physical Activity:   . Days of Exercise per Week:   . Minutes of Exercise per Session:   Stress:   . Feeling of Stress :   Social Connections:   . Frequency of Communication with Friends and Family:   . Frequency of Social Gatherings with Friends and Family:   . Attends Religious Services:   . Active Member of Clubs or Organizations:   . Attends Archivist Meetings:   Marland Kitchen Marital Status:   Intimate Partner Violence:   . Fear of Current or Ex-Partner:   . Emotionally Abused:   Marland Kitchen Physically Abused:   . Sexually Abused:     Family History  Problem Relation Age of Onset  . Colon cancer Mother 44  . Diabetes  Mother   . Lung cancer Father   . Colon polyps Neg Hx   . Esophageal cancer Neg Hx   . Rectal cancer Neg Hx   . Stomach cancer Neg Hx     Current Facility-Administered Medications  Medication Dose Route Frequency Provider Last Rate Last Admin  . acetaminophen (TYLENOL) tablet 1,000 mg  1,000 mg Oral Lajuana Ripple, MD   1,000 mg at 08/04/19 0615  . alum & mag hydroxide-simeth (MAALOX/MYLANTA) 200-200-20 MG/5ML suspension 30 mL  30 mL Oral Q6H PRN Michael Boston, MD   30 mL at 08/04/19 0739  . aspirin EC tablet 81 mg  81 mg Oral QPM Michael Boston, MD   81 mg at 08/03/19 1714  . diphenhydrAMINE (BENADRYL) 12.5 MG/5ML elixir 12.5 mg  12.5 mg Oral Q6H PRN Michael Boston, MD       Or  . diphenhydrAMINE (BENADRYL) injection 12.5 mg  12.5 mg Intravenous Q6H PRN Michael Boston, MD      . enalaprilat (VASOTEC) injection 0.625-1.25 mg  0.625-1.25 mg Intravenous Q6H PRN Michael Boston, MD      . enoxaparin (LOVENOX) injection 40 mg  40 mg Subcutaneous Q24H Michael Boston, MD   40 mg at 08/04/19 0739  . feeding supplement (ENSURE SURGERY) liquid 237 mL  237 mL Oral BID BM Michael Boston, MD   237 mL at 08/02/19 1029  . gabapentin (NEURONTIN) capsule 200 mg  200 mg Oral TID Michael Boston, MD   200 mg at 08/03/19 2109  . hydrocortisone-pramoxine (ANALPRAM-HC) 2.5-1 % rectal cream 1 application  1 application Rectal J8H PRN Michael Boston, MD      . HYDROmorphone (DILAUDID) injection 0.5-2 mg  0.5-2 mg Intravenous Q4H PRN Michael Boston, MD      . lip balm (CARMEX) ointment 1 application  1 application Topical BID Michael Boston, MD   1 application at 63/14/97 2110  . lisinopril (ZESTRIL) tablet 10 mg  10 mg Oral QPM Michael Boston, MD   10 mg at 08/03/19 1714  . magic mouthwash  15 mL Oral QID PRN Michael Boston, MD      . metoprolol tartrate (LOPRESSOR) injection 5 mg  5 mg Intravenous Q6H PRN Michael Boston, MD   5 mg at 08/01/19 1926  . nitroGLYCERIN (NITROSTAT) SL tablet 0.4 mg  0.4 mg Sublingual Q5 min PRN  Michael Boston, MD      . ondansetron [facility]) tablet 4 mg  4 mg Oral Q6H PRN Michael Boston, MD   4 mg at 08/03/19 0923   Or  . ondansetron (ZOFRAN) injection 4 mg  4 mg Intravenous Q6H PRN Michael Boston, MD      . pantoprazole (PROTONIX) EC tablet 40 mg  40  mg Oral Carlis Abbott, MD   40 mg at 08/03/19 2109  . prochlorperazine (COMPAZINE) tablet 10 mg  10 mg Oral Q6H PRN Michael Boston, MD       Or  . prochlorperazine (COMPAZINE) injection 5-10 mg  5-10 mg Intravenous Q6H PRN Michael Boston, MD      . simvastatin (ZOCOR) tablet 40 mg  40 mg Oral QPM Michael Boston, MD   40 mg at 08/03/19 1714  . traMADol (ULTRAM) tablet 50-100 mg  50-100 mg Oral Q6H PRN Michael Boston, MD   50 mg at 08/03/19 1035  . vitamin B-12 (CYANOCOBALAMIN) tablet 1,000 mcg  1,000 mcg Oral QPM Michael Boston, MD   1,000 mcg at 08/03/19 1714  . witch hazel-glycerin (TUCKS) pad 1 application  1 application Topical PRN Michael Boston, MD         Allergies  Allergen Reactions  . Clopidogrel Bisulfate Hives    Plavix="hives"    Signed: Morton Peters, MD, FACS, MASCRS Gastrointestinal and Minimally Invasive Surgery  [facility] [address] [place], [place] [address] [phone] Main / Paging [phone] Fax     08/04/2019, 8:01 AM

## 2019-08-04 NOTE — Discharge Instructions (Signed)
Remove all surgical dressings on Monday morning, 5/3    SURGERY: POST OP INSTRUCTIONS (Surgery for small bowel obstruction, colon resection, etc)   ######################################################################  EAT Gradually transition to a high fiber diet with a fiber supplement over the next few days after discharge  WALK Walk an hour a day.  Control your pain to do that.    CONTROL PAIN Control pain so that you can walk, sleep, tolerate sneezing/coughing, go up/down stairs.  HAVE A BOWEL MOVEMENT DAILY Keep your bowels regular to avoid problems.  OK to try a laxative to override constipation.  OK to use an antidairrheal to slow down diarrhea.  Call if not better after 2 tries  CALL IF YOU HAVE PROBLEMS/CONCERNS Call if you are still struggling despite following these instructions. Call if you have concerns not answered by these instructions  ######################################################################   DIET Follow a light diet the first few days at home.  Start with a bland diet such as soups, liquids, starchy foods, low fat foods, etc.  If you feel full, bloated, or constipated, stay on a ful liquid or pureed/blenderized diet for a few days until you feel better and no longer constipated. Be sure to drink plenty of fluids every day to avoid getting dehydrated (feeling dizzy, not urinating, etc.). Gradually add a fiber supplement to your diet over the next week.  Gradually get back to a regular solid diet.  Avoid fast food or heavy meals the first week as you are more likely to get nauseated. It is expected for your digestive tract to need a few months to get back to normal.  It is common for your bowel movements and stools to be irregular.  You will have occasional bloating and cramping that should eventually fade away.  Until you are eating solid food normally, off all pain medications, and back to regular activities; your bowels will not be normal. Focus on  eating a low-fat, high fiber diet the rest of your life (See Getting to [place], below).  CARE of your INCISION or WOUND It is good for closed incision and even open wounds to be washed every day.  Shower every day.  Short baths are fine.  Wash the incisions and wounds clean with soap & water.    If you have a closed incision(s), wash the incision with soap & water every day.  You may leave closed incisions open to air if it is dry.   You may cover the incision with clean gauze & replace it after your daily shower for comfort. If you have skin tapes (Steristrips) or skin glue (Dermabond) on your incision, leave them in place.  They will fall off on their own like a scab.  You may trim any edges that curl up with clean scissors.  If you have staples, set up an appointment for them to be removed in the office in 10 days after surgery.  If you have a drain, wash around the skin exit site with soap & water and place a new dressing of gauze or band aid around the skin every day.  Keep the drain site clean & dry.    If you have an open wound with packing, see wound care instructions.  In general, it is encouraged that you remove your dressing and packing, shower with soap & water, and replace your dressing once a day.  Pack the wound with clean gauze moistened with normal (0.9%) saline to keep the wound moist & uninfected.  Pressure on the dressing for 30 minutes will stop most wound bleeding.  Eventually your body will heal & pull the open wound closed over the next few months.  Raw open wounds will occasionally bleed or secrete yellow drainage until it heals closed.  Drain sites will drain a little until the drain is removed.  Even closed incisions can have mild bleeding or drainage the first few days until the skin edges scab over & seal.   If you have an open wound with a wound vac, see wound vac care instructions.     ACTIVITIES as tolerated Start light daily activities --- self-care,  walking, climbing stairs-- beginning the day after surgery.  Gradually increase activities as tolerated.  Control your pain to be active.  Stop when you are tired.  Ideally, walk several times a day, eventually an hour a day.   Most people are back to most day-to-day activities in a few weeks.  It takes 4-8 weeks to get back to unrestricted, intense activity. If you can walk 30 minutes without difficulty, it is safe to try more intense activity such as jogging, treadmill, bicycling, low-impact aerobics, swimming, etc. Save the most intensive and strenuous activity for last (Usually 4-8 weeks after surgery) such as sit-ups, heavy lifting, contact sports, etc.  Refrain from any intense heavy lifting or straining until you are off narcotics for pain control.  You will have off days, but things should improve week-by-week. DO NOT PUSH THROUGH PAIN.  Let pain be your guide: If it hurts to do something, don't do it.  Pain is your body warning you to avoid that activity for another week until the pain goes down. You may drive when you are no longer taking narcotic prescription pain medication, you can comfortably wear a seatbelt, and you can safely make sudden turns/stops to protect yourself without hesitating due to pain. You may have sexual intercourse when it is comfortable. If it hurts to do something, stop.  MEDICATIONS Take your usually prescribed home medications unless otherwise directed.   Blood thinners:  Usually you can restart any strong blood thinners after the second postoperative day.  It is OK to take aspirin right away.     If you are on strong blood thinners (warfarin/Coumadin, Plavix, Xerelto, Eliquis, Pradaxa, etc), discuss with your surgeon, medicine PCP, and/or cardiologist for instructions on when to restart the blood thinner & if blood monitoring is needed (PT/INR blood check, etc).     PAIN CONTROL Pain after surgery or related to activity is often due to strain/injury to muscle,  tendon, nerves and/or incisions.  This pain is usually short-term and will improve in a few months.  To help speed the process of healing and to get back to regular activity more quickly, DO THE FOLLOWING THINGS TOGETHER: 1. Increase activity gradually.  DO NOT PUSH THROUGH PAIN 2. Use Ice and/or Heat 3. Try Gentle Massage and/or Stretching 4. Take over the counter pain medication 5. Take Narcotic prescription pain medication for more severe pain  Good pain control = faster recovery.  It is better to take more medicine to be more active than to stay in bed all day to avoid medications. 1.  Increase activity gradually Avoid heavy lifting at first, then increase to lifting as tolerated over the next 6 weeks. Do not "push through" the pain.  Listen to your body and avoid positions and maneuvers than reproduce the pain.  Wait a few days before trying something more intense Walking an  hour a day is encouraged to help your body recover faster and more safely.  Start slowly and stop when getting sore.  If you can walk 30 minutes without stopping or pain, you can try more intense activity (running, jogging, aerobics, cycling, swimming, treadmill, sex, sports, weightlifting, etc.) Remember: If it hurts to do it, then don't do it! 2. Use Ice and/or Heat You will have swelling and bruising around the incisions.  This will take several weeks to resolve. Ice packs or heating pads (6-8 times a day, 30-60 minutes at a time) will help sooth soreness & bruising. Some people prefer to use ice alone, heat alone, or alternate between ice & heat.  Experiment and see what works best for you.  Consider trying ice for the first few days to help decrease swelling and bruising; then, switch to heat to help relax sore spots and speed recovery. Shower every day.  Short baths are fine.  It feels good!  Keep the incisions and wounds clean with soap & water.   3. Try Gentle Massage and/or Stretching Massage at the area of pain  many times a day Stop if you feel pain - do not overdo it 4. Take over the counter pain medication This helps the muscle and nerve tissues become less irritable and calm down faster Choose ONE of the following over-the-counter anti-inflammatory medications: Acetaminophen 520m tabs (Tylenol) 1-2 pills with every meal and just before bedtime (avoid if you have liver problems or if you have acetaminophen in you narcotic prescription) Naproxen 2216mtabs (ex. Aleve, Naprosyn) 1-2 pills twice a day (avoid if you have kidney, stomach, IBD, or bleeding problems) Ibuprofen 20079mabs (ex. Advil, Motrin) 3-4 pills with every meal and just before bedtime (avoid if you have kidney, stomach, IBD, or bleeding problems) Take with food/snack several times a day as directed for at least 2 weeks to help keep pain / soreness down & more manageable. 5. Take Narcotic prescription pain medication for more severe pain A prescription for strong pain control is often given to you upon discharge (for example: oxycodone/Percocet, hydrocodone/Norco/Vicodin, or tramadol/Ultram) Take your pain medication as prescribed. Be mindful that most narcotic prescriptions contain Tylenol (acetaminophen) as well - avoid taking too much Tylenol. If you are having problems/concerns with the prescription medicine (does not control pain, nausea, vomiting, rash, itching, etc.), please call us [phone] see if we need to switch you to a different pain medicine that will work better for you and/or control your side effects better. If you need a refill on your pain medication, you must call the office before 4 pm and on weekdays only.  By federal law, prescriptions for narcotics cannot be called into a pharmacy.  They must be filled out on paper & picked up from our office by the patient or authorized caretaker.  Prescriptions cannot be filled after 4 pm nor on weekends.    WHEN TO CALL US [phone] uncontrolled or worsening  pain  Fever over 101 F (38.5 C) Concerns with the incision: Worsening pain, redness, rash/hives, swelling, bleeding, or drainage Reactions / problems with new medications (itching, rash, hives, nausea, etc.) Nausea and/or vomiting Difficulty urinating Difficulty breathing Worsening fatigue, dizziness, lightheadedness, blurred vision Other concerns If you are not getting better after two weeks or are noticing you are getting worse, contact our office [phone] for further advice.  We may need to adjust your medications, re-evaluate you in the office, send you to the emergency room,  or see what other things we can do to help. The clinic staff is available to answer your questions during regular business hours (8:30am-5pm).  Please don't hesitate to call and ask to speak to one of our nurses for clinical concerns.    A surgeon from [facility] is always on call at the hospitals 24 hours/day If you have a medical emergency, go to the nearest emergency room or call 911.  FOLLOW UP in our office One the day of your discharge from the hospital (or the next business weekday), please call [facility] to set up or confirm an appointment to see your surgeon in the office for a follow-up appointment.  Usually it is 2-3 weeks after your surgery.   If you have skin staples at your incision(s), let the office know so we can set up a time in the office for the nurse to remove them (usually around 10 days after surgery). Make sure that you call for appointments the day of discharge (or the next business weekday) from the hospital to ensure a convenient appointment time. IF YOU HAVE DISABILITY OR FAMILY LEAVE FORMS, BRING THEM TO THE OFFICE FOR PROCESSING.  DO NOT GIVE THEM TO YOUR DOCTOR.  [facility] [address], [place], [place], [place]  [address] ? [phone] - Main [phone] - [place],  [phone] -  Fax [email or web address]  GETTING TO GOOD BOWEL HEALTH. It is expected for your digestive tract to need a few months to get back to normal.  It is common for your bowel movements and stools to be irregular.  You will have occasional bloating and cramping that should eventually fade away.  Until you are eating solid food normally, off all pain medications, and back to regular activities; your bowels will not be normal.   Avoiding constipation The goal: ONE SOFT BOWEL MOVEMENT A DAY!    Drink plenty of fluids.  Choose water first. TAKE A FIBER SUPPLEMENT EVERY DAY THE REST OF YOUR LIFE During your first week back home, gradually add back a fiber supplement every day Experiment which form you can tolerate.   There are many forms such as powders, tablets, wafers, gummies, etc Psyllium bran (Metamucil), methylcellulose (Citrucel), Miralax or Glycolax, Benefiber, Flax Seed.  Adjust the dose week-by-week (1/2 dose/day to 6 doses a day) until you are moving your bowels 1-2 times a day.  Cut back the dose or try a different fiber product if it is giving you problems such as diarrhea or bloating. Sometimes a laxative is needed to help jump-start bowels if constipated until the fiber supplement can help regulate your bowels.  If you are tolerating eating & you are farting, it is okay to try a gentle laxative such as double dose MiraLax, prune juice, or Milk of Magnesia.  Avoid using laxatives too often. Stool softeners can sometimes help counteract the constipating effects of narcotic pain medicines.  It can also cause diarrhea, so avoid using for too long. If you are still constipated despite taking fiber daily, eating solids, and a few doses of laxatives, call our office. Controlling diarrhea Try drinking liquids and eating bland foods for a few days to avoid stressing your intestines further. Avoid dairy products (especially milk & ice cream) for a short time.  The intestines often can lose the  ability to digest lactose when stressed. Avoid foods that cause gassiness or bloating.  Typical foods include beans and other legumes, cabbage, broccoli, and dairy  foods.  Avoid greasy, spicy, fast foods.  Every person has some sensitivity to other foods, so listen to your body and avoid those foods that trigger problems for you. Probiotics (such as active yogurt, Align, etc) may help repopulate the intestines and colon with normal bacteria and calm down a sensitive digestive tract Adding a fiber supplement gradually can help thicken stools by absorbing excess fluid and retrain the intestines to act more normally.  Slowly increase the dose over a few weeks.  Too much fiber too soon can backfire and cause cramping & bloating. It is okay to try and slow down diarrhea with a few doses of antidiarrheal medicines.   Bismuth subsalicylate (ex. Kayopectate, Pepto Bismol) for a few doses can help control diarrhea.  Avoid if pregnant.   Loperamide (Imodium) can slow down diarrhea.  Start with one tablet (45m) first.  Avoid if you are having fevers or severe pain.  ILEOSTOMY PATIENTS WILL HAVE CHRONIC DIARRHEA since their colon is not in use.    Drink plenty of liquids.  You will need to drink even more glasses of water/liquid a day to avoid getting dehydrated. Record output from your ileostomy.  Expect to empty the bag every 3-4 hours at first.  Most people with a permanent ileostomy empty their bag 4-6 times at the least.   Use antidiarrheal medicine (especially Imodium) several times a day to avoid getting dehydrated.  Start with a dose at bedtime & breakfast.  Adjust up or down as needed.  Increase antidiarrheal medications as directed to avoid emptying the bag more than 8 times a day (every 3 hours). Work with your wound ostomy nurse to learn care for your ostomy.  See ostomy care instructions. TROUBLESHOOTING IRREGULAR BOWELS 1) Start with a soft & bland diet. No spicy, greasy, or fried foods.  2) Avoid  gluten/wheat or dairy products from diet to see if symptoms improve. 3) Miralax 17gm or flax seed mixed in [place] water or juice-daily. May use 2-4 times a day as needed. 4) Gas-X, Phazyme, etc. as needed for gas & bloating.  5) Prilosec (omeprazole) over-the-counter as needed 6)  Consider probiotics (Align, Activa, etc) to help calm the bowels down  Call your doctor if you are getting worse or not getting better.  Sometimes further testing (cultures, endoscopy, X-ray studies, CT scans, bloodwork, etc.) may be needed to help diagnose and treat the cause of the diarrhea. [facility] [place]  [phone]- Main.    [phone] - Toll Free.   [phone] Fax [email or web address]   ANORECTAL SURGERY:  POST OPERATIVE INSTRUCTIONS    Use a Sitz Bath 4-8 times a day for relief   Sitz Bath A sitz bath is a warm water bath taken in the sitting position that covers only the hips and buttocks. It may be used for either healing or hygiene purposes. Sitz baths are also used to relieve pain, itching, or muscle spasms. The water may contain medicine. Moist heat will help you heal and relax.  HOME CARE INSTRUCTIONS  Take 3 to 4 sitz baths a day. 1. Fill the bathtub half full with warm water. 2. Sit in the water and open the drain a little. 3. Turn on the warm water to keep the tub half full. Keep the water running constantly. 4. Soak in the water for 15 to 20 minutes. 5. After the sitz bath, pat the affected area dry first.  6. KEEP YOUR BOWELS REGULAR a. The goal is one soft bowel movement a day b. Avoid getting constipated.  Between the surgery and the pain medications, it is common to experience some constipation.  Increasing fluid intake and taking a fiber supplement (such as Metamucil, Citrucel, FiberCon, MiraLax, etc) 2-3 times a day regularly will usually help prevent this problem from occurring.  A mild  laxative (prune juice, Milk of Magnesia, MiraLax, etc) should be taken according to package directions if there are no bowel movements after 48 hours. c. Watch out for diarrhea.  If you have many loose bowel movements, simplify your diet to bland foods & liquids for a few days.  Stop any stool softeners and decrease your fiber supplement.  Switching to mild anti-diarrheal medications (Kayopectate, Pepto Bismol) can help.  Can try an imodium/loperamide dose.  If this worsens or does not improve, please call us.  7. Wound Care  a. Wear an absorbent pad or soft cotton balls in your underwear as needed to catch any drainage and help keep the area  b. Keep the area clean and dry.  Bathe / shower every day.  Keep the area clean by showering / bathing over the incision / wound.   It is okay to soak an open wound to help wash it.  Consider using a squeeze bottle filled with warm water to gently wash the anal area.  Wet wipes or showers / gentle washing after bowel movements is often less traumatic than regular toilet paper. c. You will often notice bleeding with bowel movements.  This should slow down by the end of the first week of surgery.  Sitting on an ice pack can help. d. Expect some drainage.  This should slow down by the end of the first week of surgery, but you will have occasional bleeding or drainage up to a few months after surgery.  Wear an absorbent pad or soft cotton gauze in your underwear until the drainage stops.  8. ACTIVITIES as tolerated:   a. You may resume regular (light) daily activities beginning the next day--such as daily self-care, walking, climbing stairs--gradually increasing activities as tolerated.  If you can walk 30 minutes without difficulty, it is safe to try more intense activity such as jogging, treadmill, bicycling, low-impact aerobics, swimming, etc. b. Save the most intensive and strenuous activity for last such as sit-ups, heavy lifting, contact sports, etc  Refrain from  any heavy lifting or straining until you are off narcotics for pain control.   c. DO NOT PUSH THROUGH PAIN.  Let pain be your guide: If it hurts to do something, don't do it.  Pain is your body warning you to avoid that activity for another week until the pain goes down. d. You may drive when you are no longer taking prescription pain medication, you can comfortably sit for long periods of time, and you can safely maneuver your car and apply brakes. e. Dennis Bast may have sexual intercourse when it is comfortable.  9. FOLLOW UP in our office a. Please call [facility] at [phone] to set up an appointment to see your surgeon in the office for a follow-up appointment approximately 2-3 weeks after your surgery. b. Make sure that you call for this appointment the day you arrive home to ensure a convenient appointment time.  8. IF YOU HAVE DISABILITY OR FAMILY LEAVE FORMS, BRING THEM TO THE OFFICE FOR PROCESSING.  DO NOT GIVE THEM TO YOUR DOCTOR.  WHEN TO CALL us [phone]: 6. Poor pain control 7. Reactions / problems with new medications (rash/itching, nausea, etc)  8. Fever over 101.5 F (38.5 C) 9. Inability to urinate 10. Nausea and/or vomiting 11. Worsening swelling or bruising 12. Continued bleeding from incision. 13. Increased pain, redness, or drainage from the incision  The clinic staff is available to answer your questions during regular business hours (8:30am-5pm).  Please don't hesitate to call and ask to speak to one of our nurses for clinical concerns.   A surgeon from [facility] is always on call at the hospitals   If you have a medical emergency, go to the nearest emergency room or call 911.    [facility], [place], [place], [place], [place]  [address] ? MAIN: [phone] ? TOLL FREE: [phone] ? FAX [phone] [email or web address]

## 2019-08-04 NOTE — Progress Notes (Signed)
Daughter In to take patient home. 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 Both IV's dc'd. Discharge instructions explained to patient, questions answered. One prescription patient needs to pick up at pharmacy.

## 2019-08-14 ENCOUNTER — Other Ambulatory Visit: Payer: Self-pay

## 2019-08-14 LAB — SURGICAL PATHOLOGY

## 2019-08-21 ENCOUNTER — Encounter (HOSPITAL_COMMUNITY): Payer: Self-pay

## 2019-08-29 NOTE — Progress Notes (Signed)
[place]   [phone] [phone]   Clinic Follow up Note   Patient Care Team: Corine Shelter, PA-C as PCP - General (Physician Assistant) Minus Breeding, MD as PCP - Cardiology (Cardiology) Michael Boston, MD as Consulting Physician (General Surgery) Minus Breeding, MD as Consulting Physician (Cardiology) Loletha Carrow Kirke Corin, MD as Consulting Physician (Gastroenterology) Truitt Merle, MD as Consulting Physician (Oncology)  Date of Service:  09/05/2019  CHIEF COMPLAINT: F/u of colon cancer   SUMMARY OF ONCOLOGIC HISTORY: Oncology History Overview Note  Cancer Staging No matching staging information was found for the patient.    Cancer of left colon ([place]) (Resolved)  05/22/2019 Procedure   Colonoscopy by Dr Loletha Carrow 05/22/19  IMPRESSION - A fullness with overlying mild erythema right peri-anal area. Purulent material found on perianal exam. - The examined portion of the ileum was normal. - Three 3 to 5 mm polyps in the rectum and in the ascending colon, removed with a cold snare. Resected and retrieved. - Diverticulosis in the left colon. - Likely malignant partially obstructing tumor at the splenic flexure. Biopsied. Tattooed. - The examination was otherwise normal on direct and retroflexion views.   05/22/2019 Initial Biopsy   Diagnosis 05/22/19 1. Ascending Colon Polyp, rectal, polyps, 3 - TUBULAR ADENOMA(S) WITHOUT HIGH-GRADE DYSPLASIA OR MALIGNANCY - INFLAMMATORY POLYP(S) 2. Descending Colon Biopsy, colon mass - ADENOCARCINOMA. SEE NOTE   05/31/2019 Imaging   CT CAP W Contrast 05/31/19  IMPRESSION: 1. Apple-core lesion described on recent colonoscopy report is identified in the proximal to mid transverse colon. This lesion measures about 3.8 cm in length and is associated with small lymph nodes in the omentum just inferior to the lesion. 2. 1.6 cm ill-defined low-density lesion in the medial segment left liver, highly suspicious for  metastatic disease. MRI abdomen with and without contrast would likely prove helpful to further evaluate. 3. Upper normal hepatoduodenal ligament lymph nodes. Attention on follow-up imaging recommended. 4. Tiny bilateral pulmonary nodules measuring up to 4 mm. Unlikely to be metastatic, but attention on follow-up recommended. 5. Tiny hiatal hernia. 6. Aortic Atherosclerosis (ICD10-I70.0).   06/03/2019 Initial Diagnosis   Cancer of left colon ([place])   06/05/2019 Imaging   MRI abdomen  IMPRESSION: 1. 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 No definite signs of metastatic disease to the liver. 2. Probable iron deposition in the spleen. 3. Aortic atherosclerosis.     08/01/2019 Surgery   Surgery by Dr Johney Maine 08/01/19  XI ROBOTIC EXTENDED RIGHT COLECTOMY, OMENTALPEXY, BILATERAL TAP BLOCKS; ASSESSMENT OF TISSUE USING FIREFLY FLUORESCENCE; ANAL FISTULA REPAIR, HEMORRHOID LIGATION AND PEXY    08/01/2019 Pathology Results   FINAL MICROSCOPIC DIAGNOSIS: 08/01/19 A. COLON, PROXIMAL RIGHT COLON TO SPLENIC FLEXURE, RESECTION:  -  Adenocarcinoma, moderately differentiated, 4.0 cm  -  No carcinoma identified in thirty-one lymph nodes (0/31)  -  Margins uninvolved by carcinoma  -  See oncology table and comment below   B. SKIN, RIGHT ANTERIOR PERIRECTAL TRACT, EXCISION:  -  Squamous mucosa with underlying chronic inflammation and granulation  tissue  -  No malignancy identified   C. 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 He presents to the clinic today with his family member. He notes he recovered from surgery which he had on 08/01/19. He had diarrhea after surgery which has recently improved. He was taking Metamucil to help. He is eating adequately. He notes only abdominal pain with recent mowing lawn.     REVIEW OF SYSTEMS:   Constitutional: Denies fevers, chills or abnormal weight loss Eyes: Denies blurriness of vision Ears, nose, mouth, throat, and face: Denies mucositis or sore throat Respiratory: Denies cough, dyspnea or wheezes Cardiovascular: Denies palpitation, chest discomfort or lower extremity swelling Gastrointestinal:  Denies nausea, heartburn or change in bowel habits Skin: Denies abnormal skin rashes Lymphatics: Denies new lymphadenopathy or easy bruising Neurological:Denies numbness, tingling or new weaknesses Behavioral/Psych: Mood is stable, no new changes  All other systems were reviewed with the patient and are negative.  MEDICAL HISTORY:  Past Medical History:  Diagnosis Date  . Arthritis   . Colon cancer ([place])   . COPD (chronic obstructive pulmonary disease) ([facility])    mild per pt.  . Coronary artery disease    cardiac catheterization on January 26,2010, right coronary arter distal occlusion with stenting of this with xience drug-eluting stent.  Pt also had liminal irregularities  in the LAD and 40-50% mid circumflex  stenosis  . Diabetes mellitus without complication ([place])   . Dyspnea    exersion only  . GERD (gastroesophageal reflux disease)   . History of colon polyps   . History of kidney stones   . Hyperlipidemia   . Hypertension   . Myocardial infarction ([place]) 2010  . Perirectal abscess   . Pneumonia 2013  . Stomach ulcer 1970's    SURGICAL HISTORY: Past Surgical History:  Procedure Laterality Date  . COLONOSCOPY    . CORONARY  ANGIOPLASTY WITH STENT PLACEMENT  2010  . POLYPECTOMY      I have reviewed the social history and family history with the patient and they are unchanged from previous note.  ALLERGIES:  is allergic to clopidogrel bisulfate.  MEDICATIONS:  Current Outpatient Medications  Medication Sig Dispense Refill  . aspirin EC 81 MG tablet Take 81 mg by mouth every evening.     Marland Kitchen b complex vitamins tablet Take 1 tablet by mouth every evening.     Marland Kitchen lisinopril (PRINIVIL,ZESTRIL) 10 MG tablet TAKE 1 TABLET BY MOUTH EVERY DAY (Patient taking differently: Take 10 mg by mouth every evening. TAKE 1 TABLET BY MOUTH EVERY DAY) 30 tablet 3  . metFORMIN (GLUCOPHAGE) 500 MG tablet Take 500 mg by mouth every evening.     . naproxen sodium (ALEVE) 220 MG tablet Take 220-440 mg by mouth 2 (two) times daily as needed (pain.).    Marland Kitchen nitroGLYCERIN (NITROSTAT) 0.4 MG SL tablet Place 1 tablet (0.4 mg total) under the tongue every 5 (five) minutes as needed. 25 tablet 2  . pantoprazole (PROTONIX) 40 MG tablet Take 40 mg by mouth every other day. In the evening.    . simvastatin (ZOCOR) 40 MG tablet TAKE 1 TABLET (40 MG TOTAL) BY MOUTH AT BEDTIME. (Patient taking differently: Take 40 mg by mouth every evening. ) 30 tablet 6  . vitamin B-12 (CYANOCOBALAMIN) 1000 MCG  tablet Take 1,000 mcg by mouth every evening.     Current Facility-Administered Medications  Medication Dose Route Frequency Provider Last Rate Last Admin  . 0.9 %  sodium chloride infusion  500 mL Intravenous Once Nelida Meuse III, MD        PHYSICAL EXAMINATION: ECOG PERFORMANCE STATUS: 0 - Asymptomatic  Vitals:   09/05/19 0929  BP: 130/64  Pulse: 72  Resp: 18  Temp: 98.1 F (36.7 C)  SpO2: 100%   Filed Weights   09/05/19 0929  Weight: 183 lb 14.4 oz (83.4 kg)    GENERAL:alert, no distress and comfortable SKIN: skin color, texture, turgor are normal, no rashes or significant lesions EYES: normal, Conjunctiva are pink and non-injected, sclera  clear  NECK: supple, thyroid normal size, non-tender, without nodularity LYMPH:  no palpable lymphadenopathy in the cervical, axillary  LUNGS: clear to auscultation and percussion with normal breathing effort HEART: regular rate & rhythm and no murmurs and no lower extremity edema ABDOMEN:abdomen soft, non-tender and normal bowel sounds (+) Surgical incisions healed very well Musculoskeletal:no cyanosis of digits and no clubbing  NEURO: alert & oriented x 3 with fluent speech, no focal motor/sensory deficits  LABORATORY DATA:  I have reviewed the data as listed CBC Latest Ref Rng & Units 09/05/2019 08/02/2019 07/26/2019  WBC 4.0 - 10.5 K/uL 10.1 13.4(H) 7.3  Hemoglobin 13.0 - 17.0 g/dL 14.2 13.7 15.8  Hematocrit 39.0 - 52.0 % 42.2 40.0 47.0  Platelets 150 - 400 K/uL 182 140(L) 140(L)     CMP Latest Ref Rng & Units 09/05/2019 08/02/2019 07/26/2019  Glucose 70 - 99 mg/dL 104(H) 131(H) 126(H)  BUN 8 - 23 mg/dL 8 10 10   Creatinine 0.61 - 1.24 mg/dL 0.97 0.98 0.81  Sodium 135 - 145 mmol/L 142 138 146(H)  Potassium 3.5 - 5.1 mmol/L 4.6 4.1 4.3  Chloride 98 - 111 mmol/L 106 106 110  CO2 22 - 32 mmol/L 25 23 28   Calcium 8.9 - 10.3 mg/dL 9.3 8.3(L) 9.0  Total Protein 6.5 - 8.1 g/dL 7.6 - -  Total Bilirubin 0.3 - 1.2 mg/dL 0.5 - -  Alkaline Phos 38 - 126 U/L 74 - -  AST 15 - 41 U/L 15 - -  ALT 0 - 44 U/L 19 - -      RADIOGRAPHIC STUDIES: I have personally reviewed the radiological images as listed and agreed with the findings in the report. No results found.   ASSESSMENT & PLAN:  [name] [name] is a 69 y.o. male with    1. Left side colon cancer, pT3N0M0, stage IIA -I reviewed his surgical pathology findings with patient and his wife in details.  He had a T3 disease with negative margins, and also the one lymph nodes were negative.  It is moderately differentiated, no high risk features such as lymphovascular invasion or perineural invasion. -I discussed his risk of cancer  recurrence after surgery, which is about 15 to 20%. Per NCCN guideline, he does not need adjuvant chemotherapy. -I discussed the [place] which is a pilot study to detect circulating tumor DNA in colon cancer patients.  Small study has showed high sensitivity and specificity to predict risk of colon cancer recurrence after surgery.  I recommend doing this test to help determine his risk of recurrence, and I would recommend adjuvant oral Xeloda if test came back positive. He is interested in testing.  -I discussed the surveillance plan, which is a physical exam and lab test (including CBC,  CMP and CEA) every 3-4 months for the first 2 years, then every 6-12 months, colonoscopy in one year, and surveillance CT scan every 12 month for up to 3 year. I discussed what concerning symptoms of cancer recurrence to watch for.  -He has recovered well from surgery. His post surgery diarrhea has much improved. Physical exam unremarkable. Will proceed with labs today.  -F/u in 4 months. He will f/u with Dr Johney Maine in interim.    2.  COPD, coronary artery disease, status post stent placement, diabetes, hypertension, arthritis -Follow-up with PCP and cardiology -He will have cardiac clearance before surgery  3. Smoking cessation -I strongly encouraged him to stop smoking, he is reluctant to commit.   PLAN:  -Lab today including GuardanReveal -We will repeat GuardanReview in 4-6 and 11-13 weeks  -Lab and F/u in 4 months    No problem-specific Assessment & Plan notes found for this encounter.   Orders Placed This Encounter  Procedures  . CBC with Differential ([facility] Only)    Standing Status:   Standing    Number of Occurrences:   10    Standing Expiration Date:   09/04/2020  . CMP ([place] [facility] only)    Standing Status:   Standing    Number of Occurrences:   10    Standing Expiration Date:   09/04/2020  . Guardant 360    Please draw for GuardReveal (not Guardant360) today, second  draw in 4-6 weeks and third draw in 11-13 weeks    Standing Status:   Standing    Number of Occurrences:   10    Standing Expiration Date:   09/04/2020  . CEA (IN HOUSE-[facility])    Standing Status:   Standing    Number of Occurrences:   10    Standing Expiration Date:   09/04/2020   All questions were answered. The patient knows to call the clinic with any problems, questions or concerns. No barriers to learning was detected. The total time spent in the appointment was 30 minutes.     Truitt Merle, MD 09/05/2019   I, Joslyn Devon, am acting as scribe for Truitt Merle, MD.   I have reviewed the above documentation for accuracy and completeness, and I agree with the above.

## 2019-09-05 ENCOUNTER — Encounter: Payer: Self-pay | Admitting: Hematology

## 2019-09-05 ENCOUNTER — Inpatient Hospital Stay: Payer: Medicare Other

## 2019-09-05 ENCOUNTER — Inpatient Hospital Stay: Payer: Medicare Other | Attending: Hematology | Admitting: Hematology

## 2019-09-05 ENCOUNTER — Other Ambulatory Visit: Payer: Self-pay

## 2019-09-05 VITALS — BP 130/64 | HR 72 | Temp 98.1°F | Resp 18 | Wt 183.9 lb

## 2019-09-05 DIAGNOSIS — F17210 Nicotine dependence, cigarettes, uncomplicated: Secondary | ICD-10-CM | POA: Insufficient documentation

## 2019-09-05 DIAGNOSIS — K449 Diaphragmatic hernia without obstruction or gangrene: Secondary | ICD-10-CM | POA: Diagnosis not present

## 2019-09-05 DIAGNOSIS — I10 Essential (primary) hypertension: Secondary | ICD-10-CM | POA: Insufficient documentation

## 2019-09-05 DIAGNOSIS — I252 Old myocardial infarction: Secondary | ICD-10-CM | POA: Insufficient documentation

## 2019-09-05 DIAGNOSIS — D122 Benign neoplasm of ascending colon: Secondary | ICD-10-CM | POA: Insufficient documentation

## 2019-09-05 DIAGNOSIS — K603 Anal fistula: Secondary | ICD-10-CM | POA: Insufficient documentation

## 2019-09-05 DIAGNOSIS — K621 Rectal polyp: Secondary | ICD-10-CM | POA: Insufficient documentation

## 2019-09-05 DIAGNOSIS — C186 Malignant neoplasm of descending colon: Secondary | ICD-10-CM | POA: Diagnosis present

## 2019-09-05 DIAGNOSIS — I2510 Atherosclerotic heart disease of native coronary artery without angina pectoris: Secondary | ICD-10-CM | POA: Diagnosis not present

## 2019-09-05 DIAGNOSIS — K5730 Diverticulosis of large intestine without perforation or abscess without bleeding: Secondary | ICD-10-CM | POA: Insufficient documentation

## 2019-09-05 DIAGNOSIS — I700 Atherosclerosis of aorta: Secondary | ICD-10-CM | POA: Insufficient documentation

## 2019-09-05 DIAGNOSIS — Z87442 Personal history of urinary calculi: Secondary | ICD-10-CM | POA: Insufficient documentation

## 2019-09-05 DIAGNOSIS — C184 Malignant neoplasm of transverse colon: Secondary | ICD-10-CM

## 2019-09-05 DIAGNOSIS — E785 Hyperlipidemia, unspecified: Secondary | ICD-10-CM | POA: Insufficient documentation

## 2019-09-05 DIAGNOSIS — J449 Chronic obstructive pulmonary disease, unspecified: Secondary | ICD-10-CM | POA: Diagnosis not present

## 2019-09-05 DIAGNOSIS — Z79899 Other long term (current) drug therapy: Secondary | ICD-10-CM | POA: Diagnosis not present

## 2019-09-05 DIAGNOSIS — Z8719 Personal history of other diseases of the digestive system: Secondary | ICD-10-CM | POA: Insufficient documentation

## 2019-09-05 DIAGNOSIS — M1990 Unspecified osteoarthritis, unspecified site: Secondary | ICD-10-CM | POA: Diagnosis not present

## 2019-09-05 LAB — CMP (CANCER CENTER ONLY)
ALT: 19 U/L (ref 0–44)
AST: 15 U/L (ref 15–41)
Albumin: 3.5 g/dL (ref 3.5–5.0)
Alkaline Phosphatase: 74 U/L (ref 38–126)
Anion gap: 11 (ref 5–15)
BUN: 8 mg/dL (ref 8–23)
CO2: 25 mmol/L (ref 22–32)
Calcium: 9.3 mg/dL (ref 8.9–10.3)
Chloride: 106 mmol/L (ref 98–111)
Creatinine: 0.97 mg/dL (ref 0.61–1.24)
GFR, Est AFR Am: >60 mL/min (ref >60)
GFR, Estimated: >60 mL/min (ref >60)
Glucose, Bld: 104 mg/dL — ABNORMAL HIGH (ref 70–99)
Potassium: 4.6 mmol/L (ref 3.5–5.1)
Sodium: 142 mmol/L (ref 135–145)
Total Bilirubin: 0.5 mg/dL (ref 0.3–1.2)
Total Protein: 7.6 g/dL (ref 6.5–8.1)

## 2019-09-05 LAB — CBC WITH DIFFERENTIAL (CANCER CENTER ONLY)
Abs Immature Granulocytes: 0.03 10*3/uL (ref 0.00–0.07)
Basophils Absolute: 0.1 10*3/uL (ref 0.0–0.1)
Basophils Relative: 1 %
Eosinophils Absolute: 0.3 10*3/uL (ref 0.0–0.5)
Eosinophils Relative: 3 %
HCT: 42.2 % (ref 39.0–52.0)
Hemoglobin: 14.2 g/dL (ref 13.0–17.0)
Immature Granulocytes: 0 %
Lymphocytes Relative: 20 %
Lymphs Abs: 2 10*3/uL (ref 0.7–4.0)
MCH: 32.9 pg (ref 26.0–34.0)
MCHC: 33.6 g/dL (ref 30.0–36.0)
MCV: 97.7 fL (ref 80.0–100.0)
Monocytes Absolute: 0.6 10*3/uL (ref 0.1–1.0)
Monocytes Relative: 6 %
Neutro Abs: 7.2 10*3/uL (ref 1.7–7.7)
Neutrophils Relative %: 70 %
Platelet Count: 182 10*3/uL (ref 150–400)
RBC: 4.32 MIL/uL (ref 4.22–5.81)
RDW: 12.9 % (ref 11.5–15.5)
WBC Count: 10.1 10*3/uL (ref 4.0–10.5)
nRBC: 0 % (ref 0.0–0.2)

## 2019-09-05 LAB — CEA (IN HOUSE-CHCC): CEA (CHCC-In House): 3.55 ng/mL (ref 0.00–5.00)

## 2019-09-06 ENCOUNTER — Telehealth: Payer: Self-pay | Admitting: Hematology

## 2019-09-06 NOTE — Telephone Encounter (Signed)
Scheduled appt per 6/3 los.  Left a vm of the appt date and time.

## 2019-09-16 ENCOUNTER — Telehealth: Payer: Self-pay

## 2019-09-16 NOTE — Telephone Encounter (Signed)
I spoke with [name] [name] and let him know Per Dr. Burr Medico his GuardantReveal test did not detect ctDNA.  He verbalized understanding.

## 2019-09-17 LAB — GUARDANT 360

## 2019-10-09 ENCOUNTER — Other Ambulatory Visit: Payer: Self-pay

## 2019-10-09 DIAGNOSIS — C184 Malignant neoplasm of transverse colon: Secondary | ICD-10-CM

## 2019-10-10 ENCOUNTER — Other Ambulatory Visit: Payer: Self-pay

## 2019-10-10 ENCOUNTER — Inpatient Hospital Stay: Payer: Medicare Other | Attending: Hematology

## 2019-10-10 ENCOUNTER — Encounter: Payer: Self-pay | Admitting: Hematology

## 2019-10-10 DIAGNOSIS — C184 Malignant neoplasm of transverse colon: Secondary | ICD-10-CM

## 2019-10-22 ENCOUNTER — Telehealth: Payer: Self-pay | Admitting: *Deleted

## 2019-10-22 NOTE — Telephone Encounter (Signed)
Notified patient that [place] result was negative

## 2019-10-31 LAB — GUARDANT 360

## 2019-11-28 ENCOUNTER — Other Ambulatory Visit: Payer: Self-pay

## 2019-11-28 ENCOUNTER — Inpatient Hospital Stay: Payer: Medicare Other | Attending: Hematology

## 2019-11-28 DIAGNOSIS — C184 Malignant neoplasm of transverse colon: Secondary | ICD-10-CM

## 2019-12-03 ENCOUNTER — Telehealth: Payer: Self-pay | Admitting: *Deleted

## 2019-12-03 NOTE — Telephone Encounter (Signed)
A message was left, re: his follow up visit. 

## 2019-12-20 ENCOUNTER — Encounter: Payer: Self-pay | Admitting: Hematology

## 2019-12-23 ENCOUNTER — Telehealth: Payer: Self-pay

## 2019-12-23 LAB — GUARDANT 360

## 2019-12-23 NOTE — Telephone Encounter (Signed)
I left vm for [name] [name] letting him know his last Guardant reveal test collected 11/28/2019 was negative.

## 2020-01-03 NOTE — Progress Notes (Addendum)
[name] [name]   [phone] [phone]   Clinic Follow up Note   Patient Care Team: Orpah Melter, MD as PCP - General (Family Medicine) Minus Breeding, MD as PCP - Cardiology (Cardiology) Michael Boston, MD as Consulting Physician (General Surgery) Minus Breeding, MD as Consulting Physician (Cardiology) Loletha Carrow Kirke Corin, MD as Consulting Physician (Gastroenterology) Truitt Merle, MD as Consulting Physician (Oncology)  Date of Service:  01/06/2020  CHIEF COMPLAINT: F/u of colon cancer   SUMMARY OF ONCOLOGIC HISTORY: Oncology History Overview Note  Cancer Staging No matching staging information was found for the patient.    Cancer of left colon ([place]) (Resolved)  05/22/2019 Procedure   Colonoscopy by Dr Loletha Carrow 05/22/19  IMPRESSION - A fullness with overlying mild erythema right peri-anal area. Purulent material found on perianal exam. - The examined portion of the ileum was normal. - Three 3 to 5 mm polyps in the rectum and in the ascending colon, removed with a cold snare. Resected and retrieved. - Diverticulosis in the left colon. - Likely malignant partially obstructing tumor at the splenic flexure. Biopsied. Tattooed. - The examination was otherwise normal on direct and retroflexion views.   05/22/2019 Initial Biopsy   Diagnosis 05/22/19 1. Ascending Colon Polyp, rectal, polyps, 3 - TUBULAR ADENOMA(S) WITHOUT HIGH-GRADE DYSPLASIA OR MALIGNANCY - INFLAMMATORY POLYP(S) 2. Descending Colon Biopsy, colon mass - ADENOCARCINOMA. SEE NOTE   05/31/2019 Imaging   CT CAP W Contrast 05/31/19  IMPRESSION: 1. Apple-core lesion described on recent colonoscopy report is identified in the proximal to mid transverse colon. This lesion measures about 3.8 cm in length and is associated with small lymph nodes in the omentum just inferior to the lesion. 2. 1.6 cm ill-defined low-density lesion in the medial segment left liver, highly suspicious for  metastatic disease. MRI abdomen with and without contrast would likely prove helpful to further evaluate. 3. Upper normal hepatoduodenal ligament lymph nodes. Attention on follow-up imaging recommended. 4. Tiny bilateral pulmonary nodules measuring up to 4 mm. Unlikely to be metastatic, but attention on follow-up recommended. 5. Tiny hiatal hernia. 6. Aortic Atherosclerosis (ICD10-I70.0).   06/03/2019 Initial Diagnosis   Cancer of left colon ([place])   06/05/2019 Imaging   MRI abdomen  IMPRESSION: 1. 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 No definite signs of metastatic disease to the liver. 2. Probable iron deposition in the spleen. 3. Aortic atherosclerosis.     08/01/2019 Surgery   Surgery by Dr Johney Maine 08/01/19  XI ROBOTIC EXTENDED RIGHT COLECTOMY, OMENTALPEXY, BILATERAL TAP BLOCKS; ASSESSMENT OF TISSUE USING FIREFLY FLUORESCENCE; ANAL FISTULA REPAIR, HEMORRHOID LIGATION AND PEXY    08/01/2019 Pathology Results   FINAL MICROSCOPIC DIAGNOSIS: 08/01/19 A. COLON, PROXIMAL RIGHT COLON TO SPLENIC FLEXURE, RESECTION:  -  Adenocarcinoma, moderately differentiated, 4.0 cm  -  No carcinoma identified in thirty-one lymph nodes (0/31)  -  Margins uninvolved by carcinoma  -  See oncology table and comment below   B. SKIN, RIGHT ANTERIOR PERIRECTAL TRACT, EXCISION:  -  Squamous mucosa with underlying chronic inflammation and granulation  tissue  -  No malignancy identified   C. 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 He presents to the clinic alone. He notes he is doing well. He notes he is eating well and gaining weight. He notes his BM are 2-3 times a day and no blood in stool.     REVIEW OF SYSTEMS:   Constitutional: Denies fevers, chills or abnormal weight loss Eyes: Denies blurriness of vision Ears, nose, mouth, throat, and face: Denies mucositis or sore throat Respiratory: Denies cough, dyspnea or wheezes Cardiovascular: Denies palpitation, chest discomfort or lower extremity swelling Gastrointestinal:  Denies nausea, heartburn or change in bowel habits Skin: Denies abnormal skin rashes Lymphatics: Denies new lymphadenopathy or easy bruising Neurological:Denies numbness, tingling or new weaknesses Behavioral/Psych: Mood is stable, no new changes  All other systems were reviewed with the patient and are negative.  MEDICAL HISTORY:  Past Medical History:  Diagnosis Date  . Arthritis   . Colon cancer ([place])   . COPD (chronic obstructive pulmonary disease) (HCC)    mild per pt.  . Coronary artery disease    cardiac catheterization on January 26,2010, right coronary arter distal occlusion with stenting of this with xience drug-eluting stent.  Pt also had liminal irregularities  in the LAD and 40-50% mid circumflex  stenosis  . Diabetes mellitus without complication ([place])   . Dyspnea    exersion only  . GERD (gastroesophageal reflux disease)   . History of colon polyps   . History of kidney stones   . Hyperlipidemia   . Hypertension   . Myocardial infarction ([place]) 2010  . Perirectal abscess   . Pneumonia 2013  . Stomach ulcer 1970's    SURGICAL HISTORY: Past Surgical History:  Procedure Laterality Date  . COLONOSCOPY    . CORONARY ANGIOPLASTY WITH STENT PLACEMENT  2010  . POLYPECTOMY      I have reviewed the social history and family history with the patient  and they are unchanged from previous note.  ALLERGIES:  is allergic to clopidogrel bisulfate.  MEDICATIONS:  Current Outpatient Medications  Medication Sig Dispense Refill  . aspirin EC 81 MG tablet Take 81 mg by mouth every evening.     Marland Kitchen b complex vitamins tablet Take 1 tablet by mouth every evening.     Marland Kitchen lisinopril (PRINIVIL,ZESTRIL) 10 MG tablet TAKE 1 TABLET BY MOUTH EVERY DAY (Patient taking differently: Take 10 mg by mouth every evening. TAKE 1 TABLET BY MOUTH EVERY DAY) 30 tablet 3  . metFORMIN (GLUCOPHAGE) 500 MG tablet Take 500 mg by mouth every evening.     . naproxen sodium (ALEVE) 220 MG tablet Take 220-440 mg by mouth 2 (two) times daily as needed (pain.).    Marland Kitchen nitroGLYCERIN (NITROSTAT) 0.4 MG SL tablet Place 1 tablet (0.4 mg total) under the tongue every 5 (five) minutes as needed. 25 tablet 2  . pantoprazole (PROTONIX) 40 MG tablet Take 40 mg by mouth every other day. In the evening.    . simvastatin (ZOCOR) 40 MG tablet TAKE 1 TABLET (40 MG TOTAL) BY MOUTH AT BEDTIME. (Patient taking differently: Take 40 mg by mouth every evening. ) 30 tablet 6  . vitamin B-12 (CYANOCOBALAMIN) 1000 MCG tablet Take 1,000 mcg by mouth every evening.     Current Facility-Administered Medications  Medication  Dose Route Frequency Provider Last Rate Last Admin  . 0.9 %  sodium chloride infusion  500 mL Intravenous Once Nelida Meuse III, MD        PHYSICAL EXAMINATION: ECOG PERFORMANCE STATUS: 0 - Asymptomatic  Vitals:   01/06/20 1025  BP: (!) 141/60  Pulse: 63  Resp: 17  Temp: (!) 97.3 F (36.3 C)  SpO2: 98%   Filed Weights   01/06/20 1025  Weight: 198 lb 3.2 oz (89.9 kg)    GENERAL:alert, no distress and comfortable SKIN: skin color, texture, turgor are normal, no rashes or significant lesions EYES: normal, Conjunctiva are pink and non-injected, sclera clear  NECK: supple, thyroid normal size, non-tender, without nodularity LYMPH:  no palpable lymphadenopathy in the cervical,  axillary  LUNGS: clear to auscultation and percussion with normal breathing effort HEART: regular rate & rhythm and no murmurs and no lower extremity edema ABDOMEN:abdomen soft, non-tender and normal bowel sounds (+) Surgical incision healed well Musculoskeletal:no cyanosis of digits and no clubbing  NEURO: alert & oriented x 3 with fluent speech, no focal motor/sensory deficits  LABORATORY DATA:  I have reviewed the data as listed CBC Latest Ref Rng & Units 01/06/2020 09/05/2019 08/02/2019  WBC 4.0 - 10.5 K/uL 7.0 10.1 13.4(H)  Hemoglobin 13.0 - 17.0 g/dL 15.8 14.2 13.7  Hematocrit 39 - 52 % 45.1 42.2 40.0  Platelets 150 - 400 K/uL 129(L) 182 140(L)     CMP Latest Ref Rng & Units 01/06/2020 09/05/2019 08/02/2019  Glucose 70 - 99 mg/dL 111(H) 104(H) 131(H)  BUN 8 - 23 mg/dL 9 8 10   Creatinine 0.61 - 1.24 mg/dL 0.99 0.97 0.98  Sodium 135 - 145 mmol/L 139 142 138  Potassium 3.5 - 5.1 mmol/L 4.1 4.6 4.1  Chloride 98 - 111 mmol/L 106 106 106  CO2 22 - 32 mmol/L 28 25 23   Calcium 8.9 - 10.3 mg/dL 9.1 9.3 8.3(L)  Total Protein 6.5 - 8.1 g/dL 7.4 7.6 -  Total Bilirubin 0.3 - 1.2 mg/dL 0.4 0.5 -  Alkaline Phos 38 - 126 U/L 59 74 -  AST 15 - 41 U/L 24 15 -  ALT 0 - 44 U/L 34 19 -      RADIOGRAPHIC STUDIES: I have personally reviewed the radiological images as listed and agreed with the findings in the report. No results found.   ASSESSMENT & PLAN:  Timon Geissinger is a 69 y.o. male with    1. Left side colon cancer, pT3N0M0, stage IIA -He was diagnosed in 05/2019. He underwent surgery with Dr Johney Maine on 08/01/19. Surgical path showed T3 disease with negative margins, and also the one lymph nodes were negative.  It is moderately differentiated, no high risk features such as lymphovascular invasion or perineural invasion. -His risk of recurrence is about 15 to 20%. Per NCCN guideline, he does not need adjuvant chemotherapy. -His GuardanReview Testing in June, July and August 2021 which  were all negative  - Continue surveillance. -I dicussed the option of continuing GuardanReview testing every 4 months for first 2 years. He is agreeable.  -He is clinically doing well. Labs reviewed, CBC and CMP WNL except plt 129K. CEA is still pending. Physical exam today unremarkable. -Continue surveillance. Next scan in spring 2022 and next colonoscopy in 07/2020.  -F/u in 4 months with scan.    2.  COPD, coronary artery disease, status post stent placement, diabetes, hypertension, arthritis -Follow-up with PCP and cardiology -He will have cardiac clearance before surgery  3. Smoking cessation -I have repeatedly encouraged him to stop smoking, he is reluctant to commit.    PLAN:  -He is clinically doing well  -f/u in 4 months with lab and CT CAP a few days before, lab including GuardantReveal    No problem-specific Assessment & Plan notes found for this encounter.   Orders Placed This Encounter  Procedures  . CT Abdomen Pelvis W Contrast    Standing Status:   Future    Standing Expiration Date:   01/05/2021    Order Specific Question:   If indicated for the ordered procedure, I authorize the administration of contrast media per Radiology protocol    Answer:   Yes    Order Specific Question:   Preferred imaging location?    Answer:   [facility]    Order Specific Question:   Is Oral Contrast requested for this exam?    Answer:   Yes, Per Radiology protocol  . CT Chest W Contrast    Standing Status:   Future    Standing Expiration Date:   01/05/2021    Order Specific Question:   If indicated for the ordered procedure, I authorize the administration of contrast media per Radiology protocol    Answer:   Yes    Order Specific Question:   Preferred imaging location?    Answer:   [place] [address]    Standing Status:   Future    Standing Expiration Date:   01/05/2021   All questions were answered. The patient knows to call the clinic with any  problems, questions or concerns. No barriers to learning was detected. The total time spent in the appointment was 30 minutes.     Truitt Merle, MD 01/06/2020   I, Joslyn Devon, am acting as scribe for Truitt Merle, MD.   I have seen the patient, examined him. I agree with the assessment and and plan and have edited the notes.

## 2020-01-06 ENCOUNTER — Other Ambulatory Visit: Payer: Self-pay

## 2020-01-06 ENCOUNTER — Encounter: Payer: Self-pay | Admitting: Hematology

## 2020-01-06 ENCOUNTER — Inpatient Hospital Stay: Payer: Medicare Other | Attending: Hematology | Admitting: Hematology

## 2020-01-06 ENCOUNTER — Inpatient Hospital Stay: Payer: Medicare Other

## 2020-01-06 ENCOUNTER — Telehealth: Payer: Self-pay | Admitting: Hematology

## 2020-01-06 VITALS — BP 141/60 | HR 63 | Temp 97.3°F | Resp 17 | Ht 64.0 in | Wt 198.2 lb

## 2020-01-06 DIAGNOSIS — C184 Malignant neoplasm of transverse colon: Secondary | ICD-10-CM

## 2020-01-06 DIAGNOSIS — I10 Essential (primary) hypertension: Secondary | ICD-10-CM | POA: Diagnosis not present

## 2020-01-06 DIAGNOSIS — Z85038 Personal history of other malignant neoplasm of large intestine: Secondary | ICD-10-CM | POA: Insufficient documentation

## 2020-01-06 DIAGNOSIS — E119 Type 2 diabetes mellitus without complications: Secondary | ICD-10-CM | POA: Insufficient documentation

## 2020-01-06 DIAGNOSIS — M1990 Unspecified osteoarthritis, unspecified site: Secondary | ICD-10-CM | POA: Diagnosis not present

## 2020-01-06 DIAGNOSIS — J449 Chronic obstructive pulmonary disease, unspecified: Secondary | ICD-10-CM | POA: Insufficient documentation

## 2020-01-06 DIAGNOSIS — Z955 Presence of coronary angioplasty implant and graft: Secondary | ICD-10-CM | POA: Diagnosis not present

## 2020-01-06 DIAGNOSIS — F17210 Nicotine dependence, cigarettes, uncomplicated: Secondary | ICD-10-CM | POA: Diagnosis not present

## 2020-01-06 DIAGNOSIS — I2510 Atherosclerotic heart disease of native coronary artery without angina pectoris: Secondary | ICD-10-CM | POA: Insufficient documentation

## 2020-01-06 LAB — CBC WITH DIFFERENTIAL (CANCER CENTER ONLY)
Abs Immature Granulocytes: 0.02 10*3/uL (ref 0.00–0.07)
Basophils Absolute: 0.1 10*3/uL (ref 0.0–0.1)
Basophils Relative: 1 %
Eosinophils Absolute: 0.2 10*3/uL (ref 0.0–0.5)
Eosinophils Relative: 2 %
HCT: 45.1 % (ref 39.0–52.0)
Hemoglobin: 15.8 g/dL (ref 13.0–17.0)
Immature Granulocytes: 0 %
Lymphocytes Relative: 26 %
Lymphs Abs: 1.8 10*3/uL (ref 0.7–4.0)
MCH: 32.8 pg (ref 26.0–34.0)
MCHC: 35 g/dL (ref 30.0–36.0)
MCV: 93.8 fL (ref 80.0–100.0)
Monocytes Absolute: 0.4 10*3/uL (ref 0.1–1.0)
Monocytes Relative: 6 %
Neutro Abs: 4.5 10*3/uL (ref 1.7–7.7)
Neutrophils Relative %: 65 %
Platelet Count: 129 10*3/uL — ABNORMAL LOW (ref 150–400)
RBC: 4.81 MIL/uL (ref 4.22–5.81)
RDW: 13.2 % (ref 11.5–15.5)
WBC Count: 7 10*3/uL (ref 4.0–10.5)
nRBC: 0 % (ref 0.0–0.2)

## 2020-01-06 LAB — CMP (CANCER CENTER ONLY)
ALT: 34 U/L (ref 0–44)
AST: 24 U/L (ref 15–41)
Albumin: 3.9 g/dL (ref 3.5–5.0)
Alkaline Phosphatase: 59 U/L (ref 38–126)
Anion gap: 5 (ref 5–15)
BUN: 9 mg/dL (ref 8–23)
CO2: 28 mmol/L (ref 22–32)
Calcium: 9.1 mg/dL (ref 8.9–10.3)
Chloride: 106 mmol/L (ref 98–111)
Creatinine: 0.99 mg/dL (ref 0.61–1.24)
GFR, Est AFR Am: >60 mL/min (ref >60)
GFR, Estimated: >60 mL/min (ref >60)
Glucose, Bld: 111 mg/dL — ABNORMAL HIGH (ref 70–99)
Potassium: 4.1 mmol/L (ref 3.5–5.1)
Sodium: 139 mmol/L (ref 135–145)
Total Bilirubin: 0.4 mg/dL (ref 0.3–1.2)
Total Protein: 7.4 g/dL (ref 6.5–8.1)

## 2020-01-06 LAB — CEA (IN HOUSE-CHCC): CEA (CHCC-In House): 3.37 ng/mL (ref 0.00–5.00)

## 2020-01-06 NOTE — Telephone Encounter (Signed)
Scheduled per 10/4 los. Printed avs and calendar for pt.

## 2020-01-09 NOTE — Progress Notes (Signed)
Cardiology Office Note   Date:  01/10/2020   ID:  [name] [name], DOB [birth year], MRN [id number]  PCP:  Aura Dials, MD  Cardiologist:   Minus Breeding, MD   Chief Complaint  Patient presents with  . Coronary Artery Disease      History of Present Illness: [name] [name] is a 69 y.o. male who presents for follow-up of CAD.  He had a negative POET (Plain Old Exercise Treadmill) in 2017.   Since I last saw him he was seen in our clinic prior to hemicolectomy.  He had colon cancer resected.  He did very well with this.  He is active doing some golfing but not as much activity as I would like. The patient denies any new symptoms such as chest discomfort, neck or arm discomfort. There has been no new shortness of breath, PND or orthopnea. There have been no reported palpitations, presyncope or syncope.    Past Medical History:  Diagnosis Date  . Arthritis   . Colon cancer ([place])   . COPD (chronic obstructive pulmonary disease) (HCC)    mild per pt.  . Coronary artery disease    cardiac catheterization on January 26,2010, right coronary arter distal occlusion with stenting of this with xience drug-eluting stent.  Pt also had liminal irregularities  in the LAD and 40-50% mid circumflex  stenosis  . Diabetes mellitus without complication ([place])   . Dyspnea    exersion only  . GERD (gastroesophageal reflux disease)   . History of colon polyps   . History of kidney stones   . Hyperlipidemia   . Hypertension   . Myocardial infarction ([place]) 2010  . Perirectal abscess   . Pneumonia 2013  . Stomach ulcer 1970's    Past Surgical History:  Procedure Laterality Date  . COLONOSCOPY    . CORONARY ANGIOPLASTY WITH STENT PLACEMENT  2010  . POLYPECTOMY       Current Outpatient Medications  Medication Sig Dispense Refill  . aspirin EC 81 MG tablet Take 81 mg by mouth every evening.     Marland Kitchen b complex vitamins tablet Take 1 tablet by mouth every evening.     Marland Kitchen lisinopril  (PRINIVIL,ZESTRIL) 10 MG tablet TAKE 1 TABLET BY MOUTH EVERY DAY (Patient taking differently: Take 10 mg by mouth every evening. TAKE 1 TABLET BY MOUTH EVERY DAY) 30 tablet 3  . metFORMIN (GLUCOPHAGE) 500 MG tablet Take 500 mg by mouth every evening.     . naproxen sodium (ALEVE) 220 MG tablet Take 220-440 mg by mouth 2 (two) times daily as needed (pain.).    Marland Kitchen nitroGLYCERIN (NITROSTAT) 0.4 MG SL tablet Place 1 tablet (0.4 mg total) under the tongue every 5 (five) minutes as needed. 25 tablet 2  . pantoprazole (PROTONIX) 40 MG tablet Take 40 mg by mouth every other day. In the evening.    . simvastatin (ZOCOR) 40 MG tablet TAKE 1 TABLET (40 MG TOTAL) BY MOUTH AT BEDTIME. (Patient taking differently: Take 40 mg by mouth every evening. ) 30 tablet 6  . vitamin B-12 (CYANOCOBALAMIN) 1000 MCG tablet Take 1,000 mcg by mouth every evening.     Current Facility-Administered Medications  Medication Dose Route Frequency Provider Last Rate Last Admin  . 0.9 %  sodium chloride infusion  500 mL Intravenous Once Nelida Meuse III, MD        Allergies:   Clopidogrel bisulfate    ROS:  Please see the history of present illness.  Otherwise, review of systems are positive for none.   As stated in the HPI and negative for all other systems..   All other systems are reviewed and negative.    PHYSICAL EXAM: VS:  BP 130/73   Pulse 66   Temp (!) 95.4 F (35.2 C)   Ht 5' 4"  (1.626 m)   Wt 201 lb 9.6 oz (91.4 kg)   SpO2 98%   BMI 34.60 kg/m  , BMI Body mass index is 34.6 kg/m.  GENERAL:  Well appearing NECK:  No jugular venous distention, waveform within normal limits, carotid upstroke brisk and symmetric, no bruits, no thyromegaly LUNGS:  Clear to auscultation bilaterally CHEST:  Unremarkable HEART:  PMI not displaced or sustained,S1 and S2 within normal limits, no S3, no S4, no clicks, no rubs, no murmurs ABD:  Flat, positive bowel sounds normal in frequency in pitch, no bruits, no rebound, no  guarding, no midline pulsatile mass, no hepatomegaly, no splenomegaly EXT:  2 plus pulses throughout, no edema, no cyanosis no clubbing  EKG:  EKG is  ordered today. The ekg ordered demonstrates sinus rhythm, rate 66, possible old inferior infarct, no acute ST-T wave changes   Recent Labs: 08/02/2019: Magnesium 1.6 01/06/2020: ALT 34; BUN 9; Creatinine 0.99; Hemoglobin 15.8; Platelet Count 129; Potassium 4.1; Sodium 139    Lipid Panel   Wt Readings from Last 3 Encounters:  01/10/20 201 lb 9.6 oz (91.4 kg)  01/06/20 198 lb 3.2 oz (89.9 kg)  09/05/19 183 lb 14.4 oz (83.4 kg)      Other studies Reviewed: Additional studies/ records that were reviewed today include: Labs Review of the above records demonstrates:    See elsewhere   ASSESSMENT AND PLAN:   HYPERTENSION, UNSPECIFIED -  The blood pressure is at target.  No change in therapy.   CAD, UNSPECIFIED SITE -  The patient has no new sypmtoms.  No further cardiovascular testing is indicated.  We will continue with aggressive risk reduction and meds as listed.  TOBACCO ABUSE -  We again talked about the need to stop smoking.   DYSLIPIDEMIA - LDL was at target last year but I like to check this again today and he is fasting.  Goal is an LDL less than 70.   DM - A1C was 6.  No change in therapy.   COVID EDUCATION - He has not had his vaccine but I think after our conversation today he is likely to get it.   Current medicines are reviewed at length with the patient today.  The patient does not have concerns regarding medicines.  The following changes have been made:  None  Labs/ tests ordered today include:   Orders Placed This Encounter  Procedures  . Lipid panel  . Hepatic function panel  . EKG 12-Lead     Disposition:   FU with me in one year.  Ronnell Guadalajara, MD  01/10/2020 10:37 AM    [place]

## 2020-01-10 ENCOUNTER — Encounter: Payer: Self-pay | Admitting: Cardiology

## 2020-01-10 ENCOUNTER — Other Ambulatory Visit: Payer: Self-pay

## 2020-01-10 ENCOUNTER — Ambulatory Visit (INDEPENDENT_AMBULATORY_CARE_PROVIDER_SITE_OTHER): Payer: Medicare Other | Admitting: Cardiology

## 2020-01-10 VITALS — BP 130/73 | HR 66 | Temp 95.4°F | Ht 64.0 in | Wt 201.6 lb

## 2020-01-10 DIAGNOSIS — E118 Type 2 diabetes mellitus with unspecified complications: Secondary | ICD-10-CM

## 2020-01-10 DIAGNOSIS — I10 Essential (primary) hypertension: Secondary | ICD-10-CM

## 2020-01-10 DIAGNOSIS — Z7189 Other specified counseling: Secondary | ICD-10-CM

## 2020-01-10 DIAGNOSIS — E785 Hyperlipidemia, unspecified: Secondary | ICD-10-CM | POA: Diagnosis not present

## 2020-01-10 DIAGNOSIS — I2510 Atherosclerotic heart disease of native coronary artery without angina pectoris: Secondary | ICD-10-CM

## 2020-01-10 NOTE — Patient Instructions (Signed)
Medication Instructions:  Your physician recommends that you continue on your current medications as directed. Please refer to the Current Medication list given to you today.  *If you need a refill on your cardiac medications before your next appointment, please call your pharmacy*   Lab Work: Lipid Panel/Liver Function Test today  If you have labs (blood work) drawn today and your tests are completely normal, you will receive your results only by: Marland Kitchen MyChart Message (if you have MyChart) OR . A paper copy in the mail If you have any lab test that is abnormal or we need to change your treatment, we will call you to review the results.  Follow-Up: At [facility], you and your health needs are our priority.  As part of our continuing mission to provide you with exceptional heart care, we have created designated Provider Care Teams.  These Care Teams include your primary Cardiologist (physician) and Advanced Practice Providers (APPs -  Physician Assistants and Nurse Practitioners) who all work together to provide you with the care you need, when you need it.  We recommend signing up for the patient portal called "MyChart".  Sign up information is provided on this After Visit Summary.  MyChart is used to connect with patients for Virtual Visits (Telemedicine).  Patients are able to view lab/test results, encounter notes, upcoming appointments, etc.  Non-urgent messages can be sent to your provider as well.   To learn more about what you can do with MyChart, go to [email or web address].    Your next appointment:   12 month(s)  The format for your next appointment:   In Person  Provider:   You may see Minus Breeding, MD or one of the following Advanced Practice Providers on your designated Care Team:    Rosaria Ferries, PA-C  Jory Sims, DNP, ANP    Other Instructions

## 2020-01-11 LAB — LIPID PANEL
Chol/HDL Ratio: 3.4 ratio (ref 0.0–5.0)
Cholesterol, Total: 115 mg/dL (ref 100–199)
HDL: 34 mg/dL — ABNORMAL LOW (ref >39)
LDL Chol Calc (NIH): 51 mg/dL (ref 0–99)
Triglycerides: 179 mg/dL — ABNORMAL HIGH (ref 0–149)
VLDL Cholesterol Cal: 30 mg/dL (ref 5–40)

## 2020-01-11 LAB — HEPATIC FUNCTION PANEL
ALT: 29 IU/L (ref 0–44)
AST: 24 IU/L (ref 0–40)
Albumin: 4.4 g/dL (ref 3.8–4.8)
Alkaline Phosphatase: 68 IU/L (ref 44–121)
Bilirubin Total: 0.6 mg/dL (ref 0.0–1.2)
Bilirubin, Direct: 0.19 mg/dL (ref 0.00–0.40)
Total Protein: 7.2 g/dL (ref 6.0–8.5)

## 2020-05-04 NOTE — Progress Notes (Signed)
[name] [name]   [phone] Fax:([phone]   Clinic Follow up Note   Patient Care Team: Aura Dials, MD as PCP - General (Family Medicine) Minus Breeding, MD as PCP - Cardiology (Cardiology) Michael Boston, MD as Consulting Physician (General Surgery) Minus Breeding, MD as Consulting Physician (Cardiology) Loletha Carrow Kirke Corin, MD as Consulting Physician (Gastroenterology) Truitt Merle, MD as Consulting Physician (Oncology)  Date of Service:  05/07/2020  CHIEF COMPLAINT: F/u of colon cancer  SUMMARY OF ONCOLOGIC HISTORY: Oncology History Overview Note  Cancer Staging No matching staging information was found for the patient.    Cancer of left colon ([place]) (Resolved)  05/22/2019 Procedure   Colonoscopy by Dr Loletha Carrow 05/22/19  IMPRESSION - A fullness with overlying mild erythema right peri-anal area. Purulent material found on perianal exam. - The examined portion of the ileum was normal. - Three 3 to 5 mm polyps in the rectum and in the ascending colon, removed with a cold snare. Resected and retrieved. - Diverticulosis in the left colon. - Likely malignant partially obstructing tumor at the splenic flexure. Biopsied. Tattooed. - The examination was otherwise normal on direct and retroflexion views.   05/22/2019 Initial Biopsy   Diagnosis 05/22/19 1. Ascending Colon Polyp, rectal, polyps, 3 - TUBULAR ADENOMA(S) WITHOUT HIGH-GRADE DYSPLASIA OR MALIGNANCY - INFLAMMATORY POLYP(S) 2. Descending Colon Biopsy, colon mass - ADENOCARCINOMA. SEE NOTE   05/31/2019 Imaging   CT CAP W Contrast 05/31/19  IMPRESSION: 1. Apple-core lesion described on recent colonoscopy report is identified in the proximal to mid transverse colon. This lesion measures about 3.8 cm in length and is associated with small lymph nodes in the omentum just inferior to the lesion. 2. 1.6 cm ill-defined low-density lesion in the medial segment left liver, highly suspicious for  metastatic disease. MRI abdomen with and without contrast would likely prove helpful to further evaluate. 3. Upper normal hepatoduodenal ligament lymph nodes. Attention on follow-up imaging recommended. 4. Tiny bilateral pulmonary nodules measuring up to 4 mm. Unlikely to be metastatic, but attention on follow-up recommended. 5. Tiny hiatal hernia. 6. Aortic Atherosclerosis (ICD10-I70.0).   06/03/2019 Initial Diagnosis   Cancer of left colon ([place])   06/05/2019 Imaging   MRI abdomen  IMPRESSION: 1. 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 No definite signs of metastatic disease to the liver. 2. Probable iron deposition in the spleen. 3. Aortic atherosclerosis.     08/01/2019 Surgery   Surgery by Dr Johney Maine 08/01/19  XI ROBOTIC EXTENDED RIGHT COLECTOMY, OMENTALPEXY, BILATERAL TAP BLOCKS; ASSESSMENT OF TISSUE USING FIREFLY FLUORESCENCE; ANAL FISTULA REPAIR, HEMORRHOID LIGATION AND PEXY    08/01/2019 Pathology Results   FINAL MICROSCOPIC DIAGNOSIS: 08/01/19 A. COLON, PROXIMAL RIGHT COLON TO SPLENIC FLEXURE, RESECTION:  -  Adenocarcinoma, moderately differentiated, 4.0 cm  -  No carcinoma identified in thirty-one lymph nodes (0/31)  -  Margins uninvolved by carcinoma  -  See oncology table and comment below   B. SKIN, RIGHT ANTERIOR PERIRECTAL TRACT, EXCISION:  -  Squamous mucosa with underlying chronic inflammation and granulation  tissue  -  No malignancy identified   C. 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 He was last seen by me 4 months ago. He presents to the clinic alone.  Doing well overall  Occasional left side cramp abdominal pain a few times a week, usually before BM, no N/V, occasional loose BM, no hematochezia Eating well, has good energy level  All other systems were reviewed with the patient and are negative.  MEDICAL HISTORY:  Past Medical History:  Diagnosis Date  . Arthritis   . Colon cancer ([place])   . COPD (chronic obstructive pulmonary disease) ([facility])    mild per pt.  . Coronary artery disease    cardiac catheterization on January 26,2010, right coronary arter distal occlusion with stenting of this with xience drug-eluting stent.  Pt also had liminal irregularities  in the LAD and 40-50% mid circumflex  stenosis  . Diabetes mellitus without complication ([place])   . Dyspnea    exersion only  . GERD (gastroesophageal reflux disease)   . History of colon polyps   . History of kidney stones   . Hyperlipidemia   . Hypertension   . Myocardial infarction ([place]) 2010  . Perirectal abscess   . Pneumonia 2013  . Stomach ulcer 1970's    SURGICAL HISTORY: Past Surgical History:  Procedure Laterality Date  . COLONOSCOPY    . CORONARY ANGIOPLASTY WITH STENT PLACEMENT  2010  . POLYPECTOMY      I have reviewed the social history and family history with the patient and they are unchanged from previous note.  ALLERGIES:  is allergic to clopidogrel bisulfate.  MEDICATIONS:  Current Outpatient Medications  Medication Sig Dispense Refill  . aspirin EC 81 MG tablet Take 81 mg by mouth every evening.     Marland Kitchen b complex vitamins tablet Take 1 tablet by mouth every evening.     Marland Kitchen lisinopril (PRINIVIL,ZESTRIL) 10 MG tablet TAKE 1 TABLET BY MOUTH EVERY DAY (Patient taking differently: Take 10 mg by mouth every evening. TAKE 1 TABLET BY MOUTH EVERY DAY) 30 tablet 3   . metFORMIN (GLUCOPHAGE) 500 MG tablet Take 500 mg by mouth every evening.     . naproxen sodium (ALEVE) 220 MG tablet Take 220-440 mg by mouth 2 (two) times daily as needed (pain.).    Marland Kitchen nitroGLYCERIN (NITROSTAT) 0.4 MG SL tablet Place 1 tablet (0.4 mg total) under the tongue every 5 (five) minutes as needed. 25 tablet 2  . pantoprazole (PROTONIX) 40 MG tablet Take 40 mg by mouth every other day. In the evening.    . simvastatin (ZOCOR) 40 MG tablet TAKE 1 TABLET (40 MG TOTAL) BY MOUTH AT BEDTIME. (Patient taking differently: Take 40 mg by mouth every evening. ) 30 tablet 6  . vitamin B-12 (CYANOCOBALAMIN) 1000 MCG tablet Take 1,000 mcg by mouth every evening.     Current Facility-Administered Medications  Medication Dose Route Frequency Provider Last Rate Last Admin  . 0.9 %  sodium chloride infusion  500 mL Intravenous Once Nelida Meuse III, MD        PHYSICAL EXAMINATION: ECOG PERFORMANCE STATUS: 0 - Asymptomatic  Vitals:   05/07/20 0858  BP: (!) 141/61  Pulse: 69  Resp: 14  Temp: 97.8 F (36.6 C)  SpO2: 100%   Filed Weights  05/07/20 0858  Weight: 201 lb 6.4 oz (91.4 kg)    GENERAL:alert, no distress and comfortable SKIN: skin color, texture, turgor are normal, no rashes or significant lesions EYES: normal, Conjunctiva are pink and non-injected, sclera clear NEURO: alert & oriented x 3 with fluent speech, no focal motor/sensory deficits  LABORATORY DATA:  I have reviewed the data as listed CBC Latest Ref Rng & Units 05/05/2020 01/06/2020 09/05/2019  WBC 4.0 - 10.5 K/uL 8.3 7.0 10.1  Hemoglobin 13.0 - 17.0 g/dL 16.8 15.8 14.2  Hematocrit 39.0 - 52.0 % 48.2 45.1 42.2  Platelets 150 - 400 K/uL 142(L) 129(L) 182     CMP Latest Ref Rng & Units 05/05/2020 01/10/2020 01/06/2020  Glucose 70 - 99 mg/dL 124(H) - 111(H)  BUN 8 - 23 mg/dL 9 - 9  Creatinine 0.61 - 1.24 mg/dL 0.98 - 0.99  Sodium 135 - 145 mmol/L 139 - 139  Potassium 3.5 - 5.1 mmol/L 5.0 - 4.1  Chloride 98 - 111  mmol/L 106 - 106  CO2 22 - 32 mmol/L 26 - 28  Calcium 8.9 - 10.3 mg/dL 9.5 - 9.1  Total Protein 6.5 - 8.1 g/dL 7.9 7.2 7.4  Total Bilirubin 0.3 - 1.2 mg/dL 0.7 0.6 0.4  Alkaline Phos 38 - 126 U/L 65 68 59  AST 15 - 41 U/L 24 24 24   ALT 0 - 44 U/L 24 29 34      RADIOGRAPHIC STUDIES: I have personally reviewed the radiological images as listed and agreed with the findings in the report. CT Chest W Contrast  Result Date: 05/05/2020 CLINICAL DATA:  Colon cancer, status post transverse colon resection in March 2021, for surveillance EXAM: CT CHEST, ABDOMEN, AND PELVIS WITH CONTRAST TECHNIQUE: Multidetector CT imaging of the chest, abdomen and pelvis was performed following the standard protocol during bolus administration of intravenous contrast. CONTRAST:  158m OMNIPAQUE IOHEXOL 300 MG/ML  SOLN COMPARISON:  MRI abdomen dated 06/05/2019. CT chest abdomen pelvis dated 05/31/2019. FINDINGS: CT CHEST FINDINGS Cardiovascular: The heart is normal in size. No pericardial effusion. No evidence of thoracic aortic aneurysm. Mediastinum/Nodes: No suspicious mediastinal lymphadenopathy. Visualized thyroid is unremarkable. Lungs/Pleura: No suspicious pulmonary nodules. 3 mm calcified granuloma in the right upper lobe (series 4/image 43), unchanged, benign. 2 mm probable granuloma in the anterior left upper lobe (series 4/image 39), unchanged, benign. 4 mm subpleural nodule in the posterior right upper lobe (series 4/image 55), unchanged, benign. 2 mm subpleural nodule in the posterior right lower lobe (series 4/image 84), also likely reflecting a granuloma, unchanged/benign. Minimal dependent atelectasis in the posterior lingula and left lower lobe. No focal consolidation. No pleural effusion or pneumothorax. Musculoskeletal: Degenerative changes of the thoracic spine. CT ABDOMEN PELVIS FINDINGS Hepatobiliary: 13 mm lesion in segment 4A (series 2/image 56), less conspicuous on the current study due to phase of  contrast, previously characterized as a benign hemangioma. Additional tiny cyst in the right hepatic lobe, benign. Gallbladder is unremarkable. No intrahepatic or extrahepatic ductal dilatation. Pancreas: Within normal limits. Spleen: Within normal limits. Adrenals/Urinary Tract: Adrenal glands are within normal limits. Kidneys are within normal limits.  No hydronephrosis. Bladder is within normal limits. Stomach/Bowel: Stomach is notable for a tiny hiatal hernia. Status post right hemicolectomy with appendectomy. No colonic wall thickening or mass is evident on CT. Mild left colonic diverticulosis, without evidence of diverticulitis. Vascular/Lymphatic: No evidence of abdominal aortic aneurysm. Mild atherosclerotic calcifications abdominal aorta. No suspicious abdominopelvic lymphadenopathy. Reproductive: Prostate is unremarkable. Other: No abdominopelvic ascites. Tiny  fat containing right inguinal hernia (series 2/image 118). Additional mild fat in the bilateral inguinal canals. Musculoskeletal: Degenerative changes of the lumbar spine. IMPRESSION: Status post right hemicolectomy. No findings suspicious for recurrent or metastatic disease. Additional stable ancillary findings as above. Electronically Signed   By: Julian Hy M.D.   On: 05/05/2020 16:00   CT Abdomen Pelvis W Contrast  Result Date: 05/05/2020 CLINICAL DATA:  Colon cancer, status post transverse colon resection in March 2021, for surveillance EXAM: CT CHEST, ABDOMEN, AND PELVIS WITH CONTRAST TECHNIQUE: Multidetector CT imaging of the chest, abdomen and pelvis was performed following the standard protocol during bolus administration of intravenous contrast. CONTRAST:  123m OMNIPAQUE IOHEXOL 300 MG/ML  SOLN COMPARISON:  MRI abdomen dated 06/05/2019. CT chest abdomen pelvis dated 05/31/2019. FINDINGS: CT CHEST FINDINGS Cardiovascular: The heart is normal in size. No pericardial effusion. No evidence of thoracic aortic aneurysm.  Mediastinum/Nodes: No suspicious mediastinal lymphadenopathy. Visualized thyroid is unremarkable. Lungs/Pleura: No suspicious pulmonary nodules. 3 mm calcified granuloma in the right upper lobe (series 4/image 43), unchanged, benign. 2 mm probable granuloma in the anterior left upper lobe (series 4/image 39), unchanged, benign. 4 mm subpleural nodule in the posterior right upper lobe (series 4/image 55), unchanged, benign. 2 mm subpleural nodule in the posterior right lower lobe (series 4/image 84), also likely reflecting a granuloma, unchanged/benign. Minimal dependent atelectasis in the posterior lingula and left lower lobe. No focal consolidation. No pleural effusion or pneumothorax. Musculoskeletal: Degenerative changes of the thoracic spine. CT ABDOMEN PELVIS FINDINGS Hepatobiliary: 13 mm lesion in segment 4A (series 2/image 56), less conspicuous on the current study due to phase of contrast, previously characterized as a benign hemangioma. Additional tiny cyst in the right hepatic lobe, benign. Gallbladder is unremarkable. No intrahepatic or extrahepatic ductal dilatation. Pancreas: Within normal limits. Spleen: Within normal limits. Adrenals/Urinary Tract: Adrenal glands are within normal limits. Kidneys are within normal limits.  No hydronephrosis. Bladder is within normal limits. Stomach/Bowel: Stomach is notable for a tiny hiatal hernia. Status post right hemicolectomy with appendectomy. No colonic wall thickening or mass is evident on CT. Mild left colonic diverticulosis, without evidence of diverticulitis. Vascular/Lymphatic: No evidence of abdominal aortic aneurysm. Mild atherosclerotic calcifications abdominal aorta. No suspicious abdominopelvic lymphadenopathy. Reproductive: Prostate is unremarkable. Other: No abdominopelvic ascites. Tiny fat containing right inguinal hernia (series 2/image 118). Additional mild fat in the bilateral inguinal canals. Musculoskeletal: Degenerative changes of the lumbar  spine. IMPRESSION: Status post right hemicolectomy. No findings suspicious for recurrent or metastatic disease. Additional stable ancillary findings as above. Electronically Signed   By: SJulian HyM.D.   On: 05/05/2020 16:00     ASSESSMENT & PLAN:  [name] Leveneis a 70y.o. male with   1. Left side colon cancer,pT3N0M0, stage IIA -He was diagnosed in 05/2019. He underwent surgery with Dr GJohney Maineon 08/01/19. Surgical path showed T3 disease with negative margins, and also the one lymph nodes were negative. It is moderately differentiated, no high risk features such aslymphovascular invasion or perineural invasion. -His risk of recurrence is about 15 to 20%.Per NCCNguideline, he does not need adjuvant chemotherapy. -His GuardanReview Testing in June, July and August 2021 which were all negative. Will continue testing every 4 months for first 2 years. He is agreeable.  -I personally reviewed and discussed his CT CAP from 05/05/20 which showed no evidence of recurrence -Lab reviewed, CBC and CMP are unremarkable, CEA normal, GuardantReveal is pending  -He is clinically doing very well, no concern for  recurrence -Continue surveillance, will follow up every 4 months for this year, then every 6 months for next year   2. COPD, coronary artery disease, status post stent placement, diabetes, hypertension, arthritis -Follow-up with PCP and cardiology -He will have cardiac clearance before surgery  3. Smoking cessation -I have repeatedly encouraged him to stop smoking, he is reluctant to commit.   PLAN: -Lab and scan reviewed, no concern for recurrence -He is clinically doing well  -f/u in 4 months with lab   No problem-specific Assessment & Plan notes found for this encounter.   No orders of the defined types were placed in this encounter.  All questions were answered. The patient knows to call the clinic with any problems, questions or concerns. No barriers to  learning was detected. The total time spent in the appointment was 30 minutes.     Truitt Merle, MD 05/07/2020   I, Joslyn Devon, am acting as scribe for Truitt Merle, MD.   I have reviewed the above documentation for accuracy and completeness, and I agree with the above.

## 2020-05-05 ENCOUNTER — Inpatient Hospital Stay: Payer: Medicare Other | Attending: Hematology

## 2020-05-05 ENCOUNTER — Other Ambulatory Visit: Payer: Self-pay

## 2020-05-05 ENCOUNTER — Ambulatory Visit (HOSPITAL_COMMUNITY)
Admission: RE | Admit: 2020-05-05 | Discharge: 2020-05-05 | Disposition: A | Discharge status: Home / Self Care | Payer: Medicare Other | Source: Ambulatory Visit | Attending: Hematology | Admitting: Hematology

## 2020-05-05 DIAGNOSIS — Z955 Presence of coronary angioplasty implant and graft: Secondary | ICD-10-CM | POA: Diagnosis not present

## 2020-05-05 DIAGNOSIS — I2510 Atherosclerotic heart disease of native coronary artery without angina pectoris: Secondary | ICD-10-CM | POA: Diagnosis not present

## 2020-05-05 DIAGNOSIS — E119 Type 2 diabetes mellitus without complications: Secondary | ICD-10-CM | POA: Diagnosis not present

## 2020-05-05 DIAGNOSIS — F17210 Nicotine dependence, cigarettes, uncomplicated: Secondary | ICD-10-CM | POA: Diagnosis not present

## 2020-05-05 DIAGNOSIS — C184 Malignant neoplasm of transverse colon: Secondary | ICD-10-CM

## 2020-05-05 DIAGNOSIS — Z85038 Personal history of other malignant neoplasm of large intestine: Secondary | ICD-10-CM | POA: Insufficient documentation

## 2020-05-05 DIAGNOSIS — I10 Essential (primary) hypertension: Secondary | ICD-10-CM | POA: Diagnosis not present

## 2020-05-05 DIAGNOSIS — J449 Chronic obstructive pulmonary disease, unspecified: Secondary | ICD-10-CM | POA: Diagnosis not present

## 2020-05-05 LAB — CBC WITH DIFFERENTIAL (CANCER CENTER ONLY)
Abs Immature Granulocytes: 0.02 10*3/uL (ref 0.00–0.07)
Basophils Absolute: 0.1 10*3/uL (ref 0.0–0.1)
Basophils Relative: 1 %
Eosinophils Absolute: 0.1 10*3/uL (ref 0.0–0.5)
Eosinophils Relative: 2 %
HCT: 48.2 % (ref 39.0–52.0)
Hemoglobin: 16.8 g/dL (ref 13.0–17.0)
Immature Granulocytes: 0 %
Lymphocytes Relative: 22 %
Lymphs Abs: 1.8 10*3/uL (ref 0.7–4.0)
MCH: 33.3 pg (ref 26.0–34.0)
MCHC: 34.9 g/dL (ref 30.0–36.0)
MCV: 95.6 fL (ref 80.0–100.0)
Monocytes Absolute: 0.5 10*3/uL (ref 0.1–1.0)
Monocytes Relative: 7 %
Neutro Abs: 5.7 10*3/uL (ref 1.7–7.7)
Neutrophils Relative %: 68 %
Platelet Count: 142 10*3/uL — ABNORMAL LOW (ref 150–400)
RBC: 5.04 MIL/uL (ref 4.22–5.81)
RDW: 13 % (ref 11.5–15.5)
WBC Count: 8.3 10*3/uL (ref 4.0–10.5)
nRBC: 0 % (ref 0.0–0.2)

## 2020-05-05 LAB — CMP (CANCER CENTER ONLY)
ALT: 24 U/L (ref 0–44)
AST: 24 U/L (ref 15–41)
Albumin: 4.3 g/dL (ref 3.5–5.0)
Alkaline Phosphatase: 65 U/L (ref 38–126)
Anion gap: 7 (ref 5–15)
BUN: 9 mg/dL (ref 8–23)
CO2: 26 mmol/L (ref 22–32)
Calcium: 9.5 mg/dL (ref 8.9–10.3)
Chloride: 106 mmol/L (ref 98–111)
Creatinine: 0.98 mg/dL (ref 0.61–1.24)
GFR, Estimated: >60 mL/min (ref >60)
Glucose, Bld: 124 mg/dL — ABNORMAL HIGH (ref 70–99)
Potassium: 5 mmol/L (ref 3.5–5.1)
Sodium: 139 mmol/L (ref 135–145)
Total Bilirubin: 0.7 mg/dL (ref 0.3–1.2)
Total Protein: 7.9 g/dL (ref 6.5–8.1)

## 2020-05-05 LAB — CEA (IN HOUSE-CHCC): CEA (CHCC-In House): 3.05 ng/mL (ref 0.00–5.00)

## 2020-05-05 MED ORDER — IOHEXOL 300 MG/ML  SOLN
100.0000 mL | Freq: Once | INTRAMUSCULAR | Status: AC | PRN
  Administered 2020-05-05: 100 mL via INTRAVENOUS

## 2020-05-07 ENCOUNTER — Inpatient Hospital Stay (HOSPITAL_BASED_OUTPATIENT_CLINIC_OR_DEPARTMENT_OTHER): Payer: Medicare Other | Admitting: Hematology

## 2020-05-07 ENCOUNTER — Other Ambulatory Visit: Payer: Self-pay

## 2020-05-07 ENCOUNTER — Encounter: Payer: Self-pay | Admitting: Hematology

## 2020-05-07 VITALS — BP 141/61 | HR 69 | Temp 97.8°F | Resp 14 | Ht 64.0 in | Wt 201.4 lb

## 2020-05-07 DIAGNOSIS — C184 Malignant neoplasm of transverse colon: Secondary | ICD-10-CM | POA: Diagnosis not present

## 2020-05-07 DIAGNOSIS — Z85038 Personal history of other malignant neoplasm of large intestine: Secondary | ICD-10-CM | POA: Diagnosis not present

## 2020-05-18 ENCOUNTER — Encounter: Payer: Self-pay | Admitting: Nurse Practitioner

## 2020-05-18 LAB — GUARDANT 360

## 2020-09-03 ENCOUNTER — Encounter: Payer: Self-pay | Admitting: Hematology

## 2020-09-03 ENCOUNTER — Other Ambulatory Visit: Payer: Self-pay

## 2020-09-03 ENCOUNTER — Telehealth: Payer: Self-pay | Admitting: Hematology

## 2020-09-03 ENCOUNTER — Inpatient Hospital Stay: Payer: Medicare Other | Attending: Hematology

## 2020-09-03 ENCOUNTER — Inpatient Hospital Stay (HOSPITAL_BASED_OUTPATIENT_CLINIC_OR_DEPARTMENT_OTHER): Payer: Medicare Other | Admitting: Hematology

## 2020-09-03 VITALS — BP 136/71 | HR 69 | Temp 97.8°F | Resp 18 | Ht 64.0 in | Wt 201.0 lb

## 2020-09-03 DIAGNOSIS — C184 Malignant neoplasm of transverse colon: Secondary | ICD-10-CM

## 2020-09-03 DIAGNOSIS — I2510 Atherosclerotic heart disease of native coronary artery without angina pectoris: Secondary | ICD-10-CM | POA: Insufficient documentation

## 2020-09-03 DIAGNOSIS — I10 Essential (primary) hypertension: Secondary | ICD-10-CM | POA: Diagnosis not present

## 2020-09-03 DIAGNOSIS — J449 Chronic obstructive pulmonary disease, unspecified: Secondary | ICD-10-CM | POA: Insufficient documentation

## 2020-09-03 DIAGNOSIS — F17210 Nicotine dependence, cigarettes, uncomplicated: Secondary | ICD-10-CM | POA: Insufficient documentation

## 2020-09-03 DIAGNOSIS — E119 Type 2 diabetes mellitus without complications: Secondary | ICD-10-CM | POA: Insufficient documentation

## 2020-09-03 DIAGNOSIS — M1990 Unspecified osteoarthritis, unspecified site: Secondary | ICD-10-CM | POA: Diagnosis not present

## 2020-09-03 DIAGNOSIS — Z955 Presence of coronary angioplasty implant and graft: Secondary | ICD-10-CM | POA: Diagnosis not present

## 2020-09-03 DIAGNOSIS — Z85038 Personal history of other malignant neoplasm of large intestine: Secondary | ICD-10-CM | POA: Diagnosis present

## 2020-09-03 LAB — CBC WITH DIFFERENTIAL (CANCER CENTER ONLY)
Abs Immature Granulocytes: 0.03 10*3/uL (ref 0.00–0.07)
Basophils Absolute: 0.1 10*3/uL (ref 0.0–0.1)
Basophils Relative: 1 %
Eosinophils Absolute: 0.2 10*3/uL (ref 0.0–0.5)
Eosinophils Relative: 2 %
HCT: 44.9 % (ref 39.0–52.0)
Hemoglobin: 15.9 g/dL (ref 13.0–17.0)
Immature Granulocytes: 0 %
Lymphocytes Relative: 24 %
Lymphs Abs: 1.9 10*3/uL (ref 0.7–4.0)
MCH: 33.4 pg (ref 26.0–34.0)
MCHC: 35.4 g/dL (ref 30.0–36.0)
MCV: 94.3 fL (ref 80.0–100.0)
Monocytes Absolute: 0.6 10*3/uL (ref 0.1–1.0)
Monocytes Relative: 8 %
Neutro Abs: 5.3 10*3/uL (ref 1.7–7.7)
Neutrophils Relative %: 65 %
Platelet Count: 122 10*3/uL — ABNORMAL LOW (ref 150–400)
RBC: 4.76 MIL/uL (ref 4.22–5.81)
RDW: 12.5 % (ref 11.5–15.5)
WBC Count: 8.1 10*3/uL (ref 4.0–10.5)
nRBC: 0 % (ref 0.0–0.2)

## 2020-09-03 LAB — CMP (CANCER CENTER ONLY)
ALT: 22 U/L (ref 0–44)
AST: 18 U/L (ref 15–41)
Albumin: 3.9 g/dL (ref 3.5–5.0)
Alkaline Phosphatase: 71 U/L (ref 38–126)
Anion gap: 10 (ref 5–15)
BUN: 11 mg/dL (ref 8–23)
CO2: 26 mmol/L (ref 22–32)
Calcium: 9.1 mg/dL (ref 8.9–10.3)
Chloride: 106 mmol/L (ref 98–111)
Creatinine: 0.9 mg/dL (ref 0.61–1.24)
GFR, Estimated: >60 mL/min (ref >60)
Glucose, Bld: 114 mg/dL — ABNORMAL HIGH (ref 70–99)
Potassium: 4.5 mmol/L (ref 3.5–5.1)
Sodium: 142 mmol/L (ref 135–145)
Total Bilirubin: 0.9 mg/dL (ref 0.3–1.2)
Total Protein: 7.3 g/dL (ref 6.5–8.1)

## 2020-09-03 LAB — CEA (IN HOUSE-CHCC): CEA (CHCC-In House): 4.51 ng/mL (ref 0.00–5.00)

## 2020-09-03 NOTE — Progress Notes (Signed)
[place]   Telephone:(336) [phone] Fax:(336) [phone]   Clinic Follow up Note   Patient Care Team: Aura Dials, MD as PCP - General (Family Medicine) Minus Breeding, MD as PCP - Cardiology (Cardiology) Michael Boston, MD as Consulting Physician (General Surgery) Minus Breeding, MD as Consulting Physician (Cardiology) Loletha Carrow Kirke Corin, MD as Consulting Physician (Gastroenterology) Truitt Merle, MD as Consulting Physician (Oncology)  Date of Service:  09/03/2020  CHIEF COMPLAINT: f/u of colon cancer  SUMMARY OF ONCOLOGIC HISTORY: Oncology History Overview Note  Cancer Staging No matching staging information was found for the patient.    Cancer of left colon ([place]) (Resolved)  05/22/2019 Procedure   Colonoscopy by Dr Loletha Carrow 05/22/19  IMPRESSION - A fullness with overlying mild erythema right peri-anal area. Purulent material found on perianal exam. - The examined portion of the ileum was normal. - Three 3 to 5 mm polyps in the rectum and in the ascending colon, removed with a cold snare. Resected and retrieved. - Diverticulosis in the left colon. - Likely malignant partially obstructing tumor at the splenic flexure. Biopsied. Tattooed. - The examination was otherwise normal on direct and retroflexion views.   05/22/2019 Initial Biopsy   Diagnosis 05/22/19 1. Ascending Colon Polyp, rectal, polyps, 3 - TUBULAR ADENOMA(S) WITHOUT HIGH-GRADE DYSPLASIA OR MALIGNANCY - INFLAMMATORY POLYP(S) 2. Descending Colon Biopsy, colon mass - ADENOCARCINOMA. SEE NOTE   05/31/2019 Imaging   CT CAP W Contrast 05/31/19  IMPRESSION: 1. Apple-core lesion described on recent colonoscopy report is identified in the proximal to mid transverse colon. This lesion measures about 3.8 cm in length and is associated with small lymph nodes in the omentum just inferior to the lesion. 2. 1.6 cm ill-defined low-density lesion in the medial segment left liver, highly suspicious for  metastatic disease. MRI abdomen with and without contrast would likely prove helpful to further evaluate. 3. Upper normal hepatoduodenal ligament lymph nodes. Attention on follow-up imaging recommended. 4. Tiny bilateral pulmonary nodules measuring up to 4 mm. Unlikely to be metastatic, but attention on follow-up recommended. 5. Tiny hiatal hernia. 6. Aortic Atherosclerosis (ICD10-I70.0).   06/03/2019 Initial Diagnosis   Cancer of left colon ([place])   06/05/2019 Imaging   MRI abdomen  IMPRESSION: 1. 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 No definite signs of metastatic disease to the liver. 2. Probable iron deposition in the spleen. 3. Aortic atherosclerosis.     08/01/2019 Surgery   Surgery by Dr Johney Maine 08/01/19  XI ROBOTIC EXTENDED RIGHT COLECTOMY, OMENTALPEXY, BILATERAL TAP BLOCKS; ASSESSMENT OF TISSUE USING FIREFLY FLUORESCENCE; ANAL FISTULA REPAIR, HEMORRHOID LIGATION AND PEXY    08/01/2019 Pathology Results   FINAL MICROSCOPIC DIAGNOSIS: 08/01/19 A. COLON, PROXIMAL RIGHT COLON TO SPLENIC FLEXURE, RESECTION:  -  Adenocarcinoma, moderately differentiated, 4.0 cm  -  No carcinoma identified in thirty-one lymph nodes (0/31)  -  Margins uninvolved by carcinoma  -  See oncology table and comment below   B. SKIN, RIGHT ANTERIOR PERIRECTAL TRACT, EXCISION:  -  Squamous mucosa with underlying chronic inflammation and granulation  tissue  -  No malignancy identified   C. 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 He was last seen by me on 05/07/20. He presents to the clinic alone. He notes he had a good holiday weekend with his grandkids. He notes some intermittent pain in the left side, which occurs before bowel movements. He reports some intermittent diarrhea.   All other systems were reviewed with the patient and are negative.  MEDICAL HISTORY:  Past Medical History:  Diagnosis Date  . Arthritis   . Colon cancer ([place])   . COPD (chronic obstructive pulmonary disease) (HCC)    mild per pt.  . Coronary artery disease    cardiac catheterization on January 26,2010, right coronary arter distal occlusion with stenting of this with xience drug-eluting stent.  Pt also had liminal irregularities  in the LAD and 40-50% mid circumflex  stenosis  . Diabetes mellitus without complication ([place])   . Dyspnea    exersion only  . GERD (gastroesophageal reflux disease)   . History of colon polyps   . History of kidney stones   . Hyperlipidemia   . Hypertension   . Myocardial infarction ([place]) 2010  . Perirectal abscess   . Pneumonia 2013  . Stomach ulcer 1970's    SURGICAL HISTORY: Past Surgical History:  Procedure Laterality Date  . COLONOSCOPY    . CORONARY ANGIOPLASTY WITH STENT PLACEMENT  2010  . POLYPECTOMY      I have reviewed the social history and family history with the patient and they are unchanged from previous note.  ALLERGIES:  is allergic to clopidogrel bisulfate.  MEDICATIONS:  Current Outpatient Medications  Medication Sig Dispense Refill  . aspirin EC 81 MG tablet Take 81 mg by mouth every evening.     Marland Kitchen b complex vitamins tablet Take 1 tablet by mouth every evening.     Marland Kitchen lisinopril (PRINIVIL,ZESTRIL) 10 MG tablet TAKE 1 TABLET BY MOUTH EVERY DAY (Patient taking differently: Take 10 mg by mouth every evening. TAKE 1 TABLET BY MOUTH EVERY DAY) 30 tablet 3  .  metFORMIN (GLUCOPHAGE) 500 MG tablet Take 500 mg by mouth every evening.     . naproxen sodium (ALEVE) 220 MG tablet Take 220-440 mg by mouth 2 (two) times daily as needed (pain.).    Marland Kitchen nitroGLYCERIN (NITROSTAT) 0.4 MG SL tablet Place 1 tablet (0.4 mg total) under the tongue every 5 (five) minutes as needed. 25 tablet 2  . pantoprazole (PROTONIX) 40 MG tablet Take 40 mg by mouth every other day. In the evening.    . simvastatin (ZOCOR) 40 MG tablet TAKE 1 TABLET (40 MG TOTAL) BY MOUTH AT BEDTIME. (Patient taking differently: Take 40 mg by mouth every evening. ) 30 tablet 6  . vitamin B-12 (CYANOCOBALAMIN) 1000 MCG tablet Take 1,000 mcg by mouth every evening.     Current Facility-Administered Medications  Medication Dose Route Frequency Provider Last Rate Last Admin  . 0.9 %  sodium chloride infusion  500 mL Intravenous Once Nelida Meuse III, MD        PHYSICAL EXAMINATION: ECOG PERFORMANCE STATUS: 0 - Asymptomatic  Vitals:   09/03/20 0929  BP: 136/71  Pulse: 69  Resp: 18  Temp: 97.8 F (36.6 C)  SpO2: 98%   Filed Weights   09/03/20 0929  Weight:  201 lb (91.2 kg)    GENERAL:alert, no distress and comfortable SKIN: skin color, texture, turgor are normal, no rashes or significant lesions EYES: normal, Conjunctiva are pink and non-injected, sclera clear  NECK: supple, thyroid normal size, non-tender, without nodularity LYMPH:  no palpable lymphadenopathy in the cervical, axillary LUNGS: clear to auscultation and percussion with normal breathing effort HEART: regular rate & rhythm and no murmurs and no lower extremity edema ABDOMEN:abdomen soft, non-tender and normal bowel sounds Musculoskeletal:no cyanosis of digits and no clubbing  NEURO: alert & oriented x 3 with fluent speech, no focal motor/sensory deficits  LABORATORY DATA:  I have reviewed the data as listed CBC Latest Ref Rng & Units 09/03/2020 05/05/2020 01/06/2020  WBC 4.0 - 10.5 K/uL 8.1 8.3 7.0  Hemoglobin 13.0 - 17.0  g/dL 15.9 16.8 15.8  Hematocrit 39.0 - 52.0 % 44.9 48.2 45.1  Platelets 150 - 400 K/uL 122(L) 142(L) 129(L)     CMP Latest Ref Rng & Units 09/03/2020 05/05/2020 01/10/2020  Glucose 70 - 99 mg/dL 114(H) 124(H) -  BUN 8 - 23 mg/dL 11 9 -  Creatinine 0.61 - 1.24 mg/dL 0.90 0.98 -  Sodium 135 - 145 mmol/L 142 139 -  Potassium 3.5 - 5.1 mmol/L 4.5 5.0 -  Chloride 98 - 111 mmol/L 106 106 -  CO2 22 - 32 mmol/L 26 26 -  Calcium 8.9 - 10.3 mg/dL 9.1 9.5 -  Total Protein 6.5 - 8.1 g/dL 7.3 7.9 7.2  Total Bilirubin 0.3 - 1.2 mg/dL 0.9 0.7 0.6  Alkaline Phos 38 - 126 U/L 71 65 68  AST 15 - 41 U/L 18 24 24   ALT 0 - 44 U/L 22 24 29       RADIOGRAPHIC STUDIES: I have personally reviewed the radiological images as listed and agreed with the findings in the report. No results found.   ASSESSMENT & PLAN:  [name] [name] is a 70 y.o. male with   1. Left side colon cancer,pT3N0M0, stage IIA -He was diagnosed in 05/2019. He underwent surgery with Dr Johney Maine on 08/01/19. Surgical path showedT3 disease with negative margins, and also the one lymph nodes were negative. It is moderately differentiated, no high risk features such aslymphovascular invasion or perineural invasion. -His risk of recurrence isabout 15 to 20%.Per NCCNguideline, he does not need adjuvant chemotherapy. -HisGuardanReview Testing in June, July and August 2021which were allnegative. Will continue testing every34month for first2 years. He is agreeable.  -CT CAP from 05/05/20 showed no evidence of recurrence -Guardant 360 in 05/2020 was negative. Repeated today (09/03/20), results pending. -He is clinically doing very well, no concern for recurrence -Continue surveillance, will follow up every 4 months for this year, then every 6 months for next year -next scan in 05/2021  2. COPD, coronary artery disease, status post stent placement, diabetes, hypertension, arthritis -Follow-up with PCP and cardiology -He will have cardiac  clearance before surgery  3. Smoking cessation -He continues to smoke a pack a day. Ihave repeatedlyencouraged him to stop smoking.He notes he smokes less at home but more so with his friends, who also smoke.   PLAN: -Labs reviewed, no concern for recurrence, will call him with GuardanReveal result  -f/u and labs in 4 months    No problem-specific Assessment & Plan notes found for this encounter.   No orders of the defined types were placed in this encounter.  All questions were answered. The patient knows to call the clinic with any problems, questions or concerns. No barriers  to learning was detected. The total time spent in the appointment was 25 minutes.     Truitt Merle, MD 09/03/2020   I, Wilburn Mylar, am acting as scribe for Truitt Merle, MD.   I have reviewed the above documentation for accuracy and completeness, and I agree with the above.

## 2020-09-03 NOTE — Telephone Encounter (Signed)
Scheduled follow-up appointment per 6/2 los. Patient is aware.

## 2020-09-15 LAB — GUARDANT 360

## 2020-10-16 ENCOUNTER — Encounter: Payer: Self-pay | Admitting: Gastroenterology

## 2021-01-04 ENCOUNTER — Encounter: Payer: Self-pay | Admitting: Hematology

## 2021-01-04 ENCOUNTER — Other Ambulatory Visit: Payer: Self-pay | Admitting: Nurse Practitioner

## 2021-01-04 ENCOUNTER — Encounter: Payer: Self-pay | Admitting: Nurse Practitioner

## 2021-01-04 ENCOUNTER — Inpatient Hospital Stay: Payer: Medicare Other | Attending: Hematology | Admitting: Nurse Practitioner

## 2021-01-04 ENCOUNTER — Inpatient Hospital Stay: Payer: Medicare Other

## 2021-01-04 ENCOUNTER — Other Ambulatory Visit: Payer: Self-pay

## 2021-01-04 VITALS — BP 146/73 | HR 64 | Temp 97.8°F | Resp 18 | Ht 64.0 in | Wt 200.6 lb

## 2021-01-04 DIAGNOSIS — C186 Malignant neoplasm of descending colon: Secondary | ICD-10-CM | POA: Diagnosis not present

## 2021-01-04 DIAGNOSIS — Z87442 Personal history of urinary calculi: Secondary | ICD-10-CM | POA: Diagnosis not present

## 2021-01-04 DIAGNOSIS — Z8719 Personal history of other diseases of the digestive system: Secondary | ICD-10-CM | POA: Diagnosis not present

## 2021-01-04 DIAGNOSIS — C184 Malignant neoplasm of transverse colon: Secondary | ICD-10-CM

## 2021-01-04 DIAGNOSIS — D122 Benign neoplasm of ascending colon: Secondary | ICD-10-CM | POA: Insufficient documentation

## 2021-01-04 DIAGNOSIS — I252 Old myocardial infarction: Secondary | ICD-10-CM | POA: Insufficient documentation

## 2021-01-04 DIAGNOSIS — I700 Atherosclerosis of aorta: Secondary | ICD-10-CM | POA: Diagnosis not present

## 2021-01-04 DIAGNOSIS — K449 Diaphragmatic hernia without obstruction or gangrene: Secondary | ICD-10-CM | POA: Insufficient documentation

## 2021-01-04 DIAGNOSIS — C188 Malignant neoplasm of overlapping sites of colon: Secondary | ICD-10-CM | POA: Diagnosis present

## 2021-01-04 DIAGNOSIS — E119 Type 2 diabetes mellitus without complications: Secondary | ICD-10-CM | POA: Insufficient documentation

## 2021-01-04 DIAGNOSIS — I10 Essential (primary) hypertension: Secondary | ICD-10-CM | POA: Insufficient documentation

## 2021-01-04 DIAGNOSIS — I2510 Atherosclerotic heart disease of native coronary artery without angina pectoris: Secondary | ICD-10-CM | POA: Diagnosis not present

## 2021-01-04 DIAGNOSIS — Z79899 Other long term (current) drug therapy: Secondary | ICD-10-CM | POA: Insufficient documentation

## 2021-01-04 LAB — CBC WITH DIFFERENTIAL (CANCER CENTER ONLY)
Abs Immature Granulocytes: 0.03 10*3/uL (ref 0.00–0.07)
Basophils Absolute: 0.1 10*3/uL (ref 0.0–0.1)
Basophils Relative: 1 %
Eosinophils Absolute: 0.1 10*3/uL (ref 0.0–0.5)
Eosinophils Relative: 2 %
HCT: 46.8 % (ref 39.0–52.0)
Hemoglobin: 16.2 g/dL (ref 13.0–17.0)
Immature Granulocytes: 0 %
Lymphocytes Relative: 23 %
Lymphs Abs: 1.8 10*3/uL (ref 0.7–4.0)
MCH: 33.5 pg (ref 26.0–34.0)
MCHC: 34.6 g/dL (ref 30.0–36.0)
MCV: 96.9 fL (ref 80.0–100.0)
Monocytes Absolute: 0.5 10*3/uL (ref 0.1–1.0)
Monocytes Relative: 6 %
Neutro Abs: 5.1 10*3/uL (ref 1.7–7.7)
Neutrophils Relative %: 68 %
Platelet Count: 132 10*3/uL — ABNORMAL LOW (ref 150–400)
RBC: 4.83 MIL/uL (ref 4.22–5.81)
RDW: 12.8 % (ref 11.5–15.5)
WBC Count: 7.5 10*3/uL (ref 4.0–10.5)
nRBC: 0 % (ref 0.0–0.2)

## 2021-01-04 LAB — CMP (CANCER CENTER ONLY)
ALT: 27 U/L (ref 0–44)
AST: 20 U/L (ref 15–41)
Albumin: 4.2 g/dL (ref 3.5–5.0)
Alkaline Phosphatase: 64 U/L (ref 38–126)
Anion gap: 10 (ref 5–15)
BUN: 14 mg/dL (ref 8–23)
CO2: 26 mmol/L (ref 22–32)
Calcium: 9.4 mg/dL (ref 8.9–10.3)
Chloride: 106 mmol/L (ref 98–111)
Creatinine: 0.95 mg/dL (ref 0.61–1.24)
GFR, Estimated: >60 mL/min (ref >60)
Glucose, Bld: 114 mg/dL — ABNORMAL HIGH (ref 70–99)
Potassium: 4.3 mmol/L (ref 3.5–5.1)
Sodium: 142 mmol/L (ref 135–145)
Total Bilirubin: 0.8 mg/dL (ref 0.3–1.2)
Total Protein: 7.3 g/dL (ref 6.5–8.1)

## 2021-01-04 LAB — CEA (IN HOUSE-CHCC): CEA (CHCC-In House): 3.57 ng/mL (ref 0.00–5.00)

## 2021-01-04 NOTE — Progress Notes (Signed)
[place]   Telephone:(336) [phone] Fax:(336) [phone]   Clinic Follow up Note   Patient Care Team: Aura Dials, MD as PCP - General (Family Medicine) Minus Breeding, MD as PCP - Cardiology (Cardiology) Michael Boston, MD as Consulting Physician (General Surgery) Minus Breeding, MD as Consulting Physician (Cardiology) Loletha Carrow Kirke Corin, MD as Consulting Physician (Gastroenterology) Truitt Merle, MD as Consulting Physician (Oncology) 01/04/2021  CHIEF COMPLAINT: Follow up colon cancer  SUMMARY OF ONCOLOGIC HISTORY: Oncology History Overview Note  Cancer Staging No matching staging information was found for the patient.    Cancer of left colon ([place]) (Resolved)  05/22/2019 Procedure   Colonoscopy by Dr Loletha Carrow 05/22/19  IMPRESSION - A fullness with overlying mild erythema right peri-anal area. Purulent material found on perianal exam. - The examined portion of the ileum was normal. - Three 3 to 5 mm polyps in the rectum and in the ascending colon, removed with a cold snare. Resected and retrieved. - Diverticulosis in the left colon. - Likely malignant partially obstructing tumor at the splenic flexure. Biopsied. Tattooed. - The examination was otherwise normal on direct and retroflexion views.   05/22/2019 Initial Biopsy   Diagnosis 05/22/19 1. Ascending Colon Polyp, rectal, polyps, 3 - TUBULAR ADENOMA(S) WITHOUT HIGH-GRADE DYSPLASIA OR MALIGNANCY - INFLAMMATORY POLYP(S) 2. Descending Colon Biopsy, colon mass - ADENOCARCINOMA. SEE NOTE   05/31/2019 Imaging   CT CAP W Contrast 05/31/19  IMPRESSION: 1. Apple-core lesion described on recent colonoscopy report is identified in the proximal to mid transverse colon. This lesion measures about 3.8 cm in length and is associated with small lymph nodes in the omentum just inferior to the lesion. 2. 1.6 cm ill-defined low-density lesion in the medial segment left liver, highly suspicious for metastatic disease. MRI  abdomen with and without contrast would likely prove helpful to further evaluate. 3. Upper normal hepatoduodenal ligament lymph nodes. Attention on follow-up imaging recommended. 4. Tiny bilateral pulmonary nodules measuring up to 4 mm. Unlikely to be metastatic, but attention on follow-up recommended. 5. Tiny hiatal hernia. 6. Aortic Atherosclerosis (ICD10-I70.0).   06/03/2019 Initial Diagnosis   Cancer of left colon ([place])   06/05/2019 Imaging   MRI abdomen  IMPRESSION: 1. 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 No definite signs of metastatic disease to the liver. 2. Probable iron deposition in the spleen. 3. Aortic atherosclerosis.     08/01/2019 Surgery   Surgery by Dr Johney Maine 08/01/19  XI ROBOTIC EXTENDED RIGHT COLECTOMY, OMENTALPEXY, BILATERAL TAP BLOCKS; ASSESSMENT OF TISSUE USING FIREFLY FLUORESCENCE; ANAL FISTULA REPAIR, HEMORRHOID LIGATION AND PEXY    08/01/2019 Pathology Results   FINAL MICROSCOPIC DIAGNOSIS: 08/01/19 A. COLON, PROXIMAL RIGHT COLON TO SPLENIC FLEXURE, RESECTION:  -  Adenocarcinoma, moderately differentiated, 4.0 cm  -  No carcinoma identified in thirty-one lymph nodes (0/31)  -  Margins uninvolved by carcinoma  -  See oncology table and comment below   B. SKIN, RIGHT ANTERIOR PERIRECTAL TRACT, EXCISION:  -  Squamous mucosa with underlying chronic inflammation and granulation  tissue  -  No malignancy identified   C. 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 He was last seen by Dr. Burr Medico 09/03/20. ctDNA on that date (+57 weeks) was not detected. He is doing well, no changes to report. He continues to have occasional diarrhea and left flank pain mostly with flexing abdominal  muscles/straining, both of which have been stand since surgery. Energy and appetite are normal. Denies blood in stool, fatigue, unintentional weight loss, n/v, abd pain, or other specific complaints. He has not had a colonoscopy this year. Scheduled to get flu vac at PCP next week.      MEDICAL HISTORY:  Past Medical History:  Diagnosis Date   Arthritis    Colon cancer ([place])    COPD (chronic obstructive pulmonary disease) (HCC)    mild per pt.   Coronary artery disease    cardiac catheterization on January 26,2010, right coronary arter distal occlusion with stenting of this with xience drug-eluting stent.  Pt also had liminal irregularities  in the LAD and 40-50% mid circumflex  stenosis   Diabetes mellitus without complication (HCC)    Dyspnea    exersion only   GERD (gastroesophageal reflux disease)    History of colon polyps    History of kidney stones    Hyperlipidemia    Hypertension    Myocardial infarction [facility]) 2010   Perirectal abscess    Pneumonia 2013   Stomach ulcer 1970's    SURGICAL HISTORY: Past Surgical History:  Procedure Laterality Date   COLONOSCOPY     CORONARY ANGIOPLASTY WITH STENT PLACEMENT  2010   POLYPECTOMY      I have reviewed the social history and family history with the patient and they are unchanged from previous note.  ALLERGIES:  is allergic to clopidogrel bisulfate.  MEDICATIONS:  Current Outpatient Medications  Medication Sig Dispense Refill   aspirin EC 81 MG tablet Take 81 mg by mouth every evening.      b complex vitamins tablet Take 1 tablet by mouth every evening.      lisinopril (PRINIVIL,ZESTRIL) 10 MG tablet TAKE 1 TABLET BY  MOUTH EVERY DAY (Patient taking differently: Take 10 mg by mouth every evening. TAKE 1 TABLET BY MOUTH EVERY DAY) 30 tablet 3   metFORMIN (GLUCOPHAGE) 500 MG tablet Take 500 mg by mouth every evening.      naproxen sodium (ALEVE) 220 MG tablet Take 220-440 mg by mouth 2 (two) times daily as needed (pain.).     nitroGLYCERIN (NITROSTAT) 0.4 MG SL tablet Place 1 tablet (0.4 mg total) under the tongue every 5 (five) minutes as needed. 25 tablet 2   pantoprazole (PROTONIX) 40 MG tablet Take 40 mg by mouth every other day. In the evening.     simvastatin (ZOCOR) 40 MG tablet TAKE 1 TABLET (40 MG TOTAL) BY MOUTH AT BEDTIME. (Patient taking differently: Take 40 mg by mouth every evening. ) 30 tablet 6   vitamin B-12 (CYANOCOBALAMIN) 1000 MCG tablet Take 1,000 mcg by mouth every evening.     Current Facility-Administered Medications  Medication Dose Route Frequency Provider Last Rate Last Admin   0.9 %  sodium chloride infusion  500 mL Intravenous Once Nelida Meuse III, MD        PHYSICAL EXAMINATION: ECOG PERFORMANCE STATUS: 0 - Asymptomatic  Vitals:   01/04/21 1038  BP: (!) 146/73  Pulse:  64  Resp: 18  Temp: 97.8 F (36.6 C)  SpO2: 99%   Filed Weights   01/04/21 1038  Weight: 200 lb 9.6 oz (91 kg)    GENERAL:alert, no distress and comfortable SKIN: no rash  EYES: sclera clear NECK: without mass LYMPH:  no palpable cervical or supraclavicular lymphadenopathy  LUNGS: clear with normal breathing effort HEART: regular rate & rhythm ABDOMEN:abdomen soft, non-tender and normal bowel sounds. Sensitive to touch in the LLQ, no tenderness or palpable abnormality  Musculoskeletal: no left flank ttp NEURO: alert & oriented x 3 with fluent speech, no focal motor/sensory deficits  LABORATORY DATA:  I have reviewed the data as listed CBC Latest Ref Rng & Units 01/04/2021 09/03/2020 05/05/2020  WBC 4.0 - 10.5 K/uL 7.5 8.1 8.3  Hemoglobin 13.0 - 17.0 g/dL 16.2 15.9 16.8  Hematocrit 39.0 - 52.0 %  46.8 44.9 48.2  Platelets 150 - 400 K/uL 132(L) 122(L) 142(L)     CMP Latest Ref Rng & Units 01/04/2021 09/03/2020 05/05/2020  Glucose 70 - 99 mg/dL 114(H) 114(H) 124(H)  BUN 8 - 23 mg/dL 14 11 9   Creatinine 0.61 - 1.24 mg/dL 0.95 0.90 0.98  Sodium 135 - 145 mmol/L 142 142 139  Potassium 3.5 - 5.1 mmol/L 4.3 4.5 5.0  Chloride 98 - 111 mmol/L 106 106 106  CO2 22 - 32 mmol/L 26 26 26   Calcium 8.9 - 10.3 mg/dL 9.4 9.1 9.5  Total Protein 6.5 - 8.1 g/dL 7.3 7.3 7.9  Total Bilirubin 0.3 - 1.2 mg/dL 0.8 0.9 0.7  Alkaline Phos 38 - 126 U/L 64 71 65  AST 15 - 41 U/L 20 18 24   ALT 0 - 44 U/L 27 22 24       RADIOGRAPHIC STUDIES: I have personally reviewed the radiological images as listed and agreed with the findings in the report. No results found.   ASSESSMENT & PLAN: [name] [name] is a 70 y.o. male with    1. Left side colon cancer, pT3N0M0, moderately differentiated, stage IIA -Diagnosed 05/2019, s/p surgical resection by Dr Johney Maine on 08/01/19. Path showed T3 disease, 0/31+ nodes, negative margins. There were no high risk features except moderate differentiation. Adjuvant chemo was not recommended  -Guardant reveal ctDNA q4 months x2 years has been undetected thus far, repeated today and level is pending  -surveillance scan in 05/2020 NED, repeat 05/2021 -He has not had surveillance colonoscopy, I will message Dr. Loletha Carrow.  -continue surveillance. Next ctDNA, scan, and f/up in 4 months, then begin q6 month f/up if appropriate    2.  COPD, coronary artery disease, status post stent placement, diabetes, hypertension, arthritis -Follow-up with PCP and cardiology -stable   3. Health maintenance  -he plans to proceed with flu vaccine soon -continue healthy lifestyle, exercise, and age-appropriate f/up    Disposition:  [name] [name] is clinically doing well. Exam is benign. Labs unremarkable. We will follow up on today's CEA and guardant reveal ctDNA which has been undetected to date. There  is no clinical concern for disease recurrence.   Continue colon cancer surveillance. He did not have a colonoscopy this year, I will message Dr. Loletha Carrow. Next ctDNA, surveillance CT, and follow up in 05/2021, or sooner if needed. We reviewed s/sx of recurrence.  Orders Placed This Encounter  Procedures   CT CHEST ABDOMEN PELVIS W CONTRAST    Standing Status:   Future    Standing Expiration Date:   01/04/2022    Order Specific Question:   Preferred imaging  location?    Answer:   [facility]    Order Specific Question:   Is Oral Contrast requested for this exam?    Answer:   Yes, Per Radiology protocol    Order Specific Question:   Reason for Exam (SYMPTOM  OR DIAGNOSIS REQUIRED)    Answer:   h/o stage IIA colon cancer, surveillance     All questions were answered. The patient knows to call the clinic with any problems, questions or concerns. No barriers to learning were detected.     Alla Feeling, NP 01/04/21

## 2021-01-05 ENCOUNTER — Telehealth: Payer: Self-pay | Admitting: Nurse Practitioner

## 2021-01-05 NOTE — Telephone Encounter (Signed)
Left message with follow-up appointments per 10/3 los.

## 2021-01-06 ENCOUNTER — Telehealth: Payer: Self-pay

## 2021-01-06 NOTE — Telephone Encounter (Signed)
[name] [name],   Please arrange a clinic appointment with me for this patient.  Need to review his medical condition and plan colonoscopy for Hx colon cancer resected in 2021    - HD

## 2021-01-06 NOTE — Telephone Encounter (Signed)
Left a message asking pt to call and schedule a follow up as requested by Dr Loletha Carrow

## 2021-01-15 ENCOUNTER — Telehealth: Payer: Self-pay | Admitting: *Deleted

## 2021-01-15 NOTE — Telephone Encounter (Signed)
Contacted patient regarding Guardant Reveal test results per Dr. Ernestina Penna directions: Inform patient still negative. Patient verbalized understanding.

## 2021-01-17 DIAGNOSIS — E118 Type 2 diabetes mellitus with unspecified complications: Secondary | ICD-10-CM | POA: Insufficient documentation

## 2021-01-17 NOTE — Progress Notes (Signed)
Cardiology Office Note   Date:  01/18/2021   ID:  [name] [name], DOB 1951/02/25, MRN [id number]  PCP:  Aura Dials, MD  Cardiologist:   Minus Breeding, MD   Chief Complaint  Patient presents with   Coronary Artery Disease       History of Present Illness: [name] [name] is a 70 y.o. male who presents for follow-up of CAD.  He had a negative POET (Plain Old Exercise Treadmill) in 2017.   Since I last saw him he has had no new cardiovascular complaints.  He is having back pain right now and this is hindering him and his golf game.  However, with his activities that he can do and with the recent golfing prior to the back acting up he was not getting any cardiovascular symptoms.  In particular he was not having any of the left arm discomfort that was his previous angina.  He has no new chest pressure, neck or arm discomfort.  He has no shortness of breath, PND or orthopnea.  Past Medical History:  Diagnosis Date   Arthritis    Colon cancer ([place])    COPD (chronic obstructive pulmonary disease) (HCC)    mild per pt.   Coronary artery disease    cardiac catheterization on January 26,2010, right coronary arter distal occlusion with stenting of this with xience drug-eluting stent.  Pt also had liminal irregularities  in the LAD and 40-50% mid circumflex  stenosis   Diabetes mellitus without complication (HCC)    Dyspnea    exersion only   GERD (gastroesophageal reflux disease)    History of colon polyps    History of kidney stones    Hyperlipidemia    Hypertension    Myocardial infarction [facility] 2010   Perirectal abscess    Pneumonia 2013   Stomach ulcer 1970's    Past Surgical History:  Procedure Laterality Date   COLONOSCOPY     CORONARY ANGIOPLASTY WITH STENT PLACEMENT  2010   POLYPECTOMY       Current Outpatient Medications  Medication Sig Dispense Refill   aspirin EC 81 MG tablet Take 81 mg by mouth every evening.      b complex vitamins tablet  Take 1 tablet by mouth every evening.      cyclobenzaprine (FLEXERIL) 10 MG tablet Take 10 mg by mouth at bedtime.     lisinopril (PRINIVIL,ZESTRIL) 10 MG tablet TAKE 1 TABLET BY MOUTH EVERY DAY (Patient taking differently: Take 10 mg by mouth every evening. TAKE 1 TABLET BY MOUTH EVERY DAY) 30 tablet 3   meloxicam (MOBIC) 15 MG tablet Take 15 mg by mouth daily.     metFORMIN (GLUCOPHAGE) 500 MG tablet Take 500 mg by mouth every evening.      naproxen sodium (ALEVE) 220 MG tablet Take 220-440 mg by mouth 2 (two) times daily as needed (pain.).     nitroGLYCERIN (NITROSTAT) 0.4 MG SL tablet Place 1 tablet (0.4 mg total) under the tongue every 5 (five) minutes as needed. 25 tablet 2   pantoprazole (PROTONIX) 40 MG tablet Take 40 mg by mouth every other day. In the evening.     simvastatin (ZOCOR) 40 MG tablet TAKE 1 TABLET (40 MG TOTAL) BY MOUTH AT BEDTIME. (Patient taking differently: Take 40 mg by mouth every evening.) 30 tablet 6   vitamin B-12 (CYANOCOBALAMIN) 1000 MCG tablet Take 1,000 mcg by mouth every evening.     Current Facility-Administered Medications  Medication Dose Route Frequency  Provider Last Rate Last Admin   0.9 %  sodium chloride infusion  500 mL Intravenous Once Nelida Meuse III, MD        Allergies:   Clopidogrel bisulfate    ROS:  Please see the history of present illness.   Otherwise, review of systems are positive for none.   As stated in the HPI and negative for all other systems..   All other systems are reviewed and negative.    PHYSICAL EXAM: VS:  BP 120/76   Pulse 67   Ht 5' 4"  (1.626 m)   Wt 201 lb (91.2 kg)   SpO2 98%   BMI 34.50 kg/m  , BMI Body mass index is 34.5 kg/m.  GENERAL:  Well appearing NECK:  No jugular venous distention, waveform within normal limits, carotid upstroke brisk and symmetric, no bruits, no thyromegaly LUNGS:  Clear to auscultation bilaterally CHEST:  Unremarkable HEART:  PMI not displaced or sustained,S1 and S2 within normal  limits, no S3, no S4, no clicks, no rubs, no murmurs ABD:  Flat, positive bowel sounds normal in frequency in pitch, no bruits, no rebound, no guarding, no midline pulsatile mass, no hepatomegaly, no splenomegaly EXT:  2 plus pulses throughout, no edema, no cyanosis no clubbing  EKG:  EKG is  ordered today. The ekg ordered demonstrates sinus rhythm, rate 67, possible old inferior infarct, no acute ST-T wave changes   Recent Labs: 01/04/2021: ALT 27; BUN 14; Creatinine 0.95; Hemoglobin 16.2; Platelet Count 132; Potassium 4.3; Sodium 142    Lipid Panel   Wt Readings from Last 3 Encounters:  01/18/21 201 lb (91.2 kg)  01/04/21 200 lb 9.6 oz (91 kg)  09/03/20 201 lb (91.2 kg)      Other studies Reviewed: Additional studies/ records that were reviewed today include: Labs Review of the above records demonstrates:    See elsewhere   ASSESSMENT AND PLAN:   HYPERTENSION, UNSPECIFIED -  The blood pressure is at target.  No change in therapy.   CAD, UNSPECIFIED SITE -  The patient has no new cardiovascular symptoms.  We talked about symptoms that would prompt him to call 911.  We talked about secondary risk reduction.   TOBACCO ABUSE -  He thinks he might stop smoking because the cost is so high.  We talked about this at length today.  DYSLIPIDEMIA - LDL was 39 with an HDL of 32.  No change in therapy.  DM - A1C was 6.1.  No change in therapy.    Current medicines are reviewed at length with the patient today.  The patient does not have concerns regarding medicines.  The following changes have been made:  None  Labs/ tests ordered today include: None  Orders Placed This Encounter  Procedures   EKG 12-Lead      Disposition:   FU with me in one year.  Ronnell Guadalajara, MD  01/18/2021 12:15 PM    [place]

## 2021-01-18 ENCOUNTER — Other Ambulatory Visit: Payer: Self-pay

## 2021-01-18 ENCOUNTER — Encounter: Payer: Self-pay | Admitting: Cardiology

## 2021-01-18 ENCOUNTER — Ambulatory Visit (INDEPENDENT_AMBULATORY_CARE_PROVIDER_SITE_OTHER): Payer: Medicare Other | Admitting: Cardiology

## 2021-01-18 VITALS — BP 120/76 | HR 67 | Ht 64.0 in | Wt 201.0 lb

## 2021-01-18 DIAGNOSIS — Z720 Tobacco use: Secondary | ICD-10-CM | POA: Diagnosis not present

## 2021-01-18 DIAGNOSIS — I10 Essential (primary) hypertension: Secondary | ICD-10-CM | POA: Diagnosis not present

## 2021-01-18 DIAGNOSIS — I2510 Atherosclerotic heart disease of native coronary artery without angina pectoris: Secondary | ICD-10-CM

## 2021-01-18 DIAGNOSIS — E785 Hyperlipidemia, unspecified: Secondary | ICD-10-CM

## 2021-01-18 DIAGNOSIS — E118 Type 2 diabetes mellitus with unspecified complications: Secondary | ICD-10-CM

## 2021-01-18 NOTE — Patient Instructions (Signed)
Medication Instructions:  Your physician recommends that you continue on your current medications as directed. Please refer to the Current Medication list given to you today.  *If you need a refill on your cardiac medications before your next appointment, please call your pharmacy*   Follow-Up: At [facility], you and your health needs are our priority.  As part of our continuing mission to provide you with exceptional heart care, we have created designated Provider Care Teams.  These Care Teams include your primary Cardiologist (physician) and Advanced Practice Providers (APPs -  Physician Assistants and Nurse Practitioners) who all work together to provide you with the care you need, when you need it.  We recommend signing up for the patient portal called "MyChart".  Sign up information is provided on this After Visit Summary.  MyChart is used to connect with patients for Virtual Visits (Telemedicine).  Patients are able to view lab/test results, encounter notes, upcoming appointments, etc.  Non-urgent messages can be sent to your provider as well.   To learn more about what you can do with MyChart, go to [email or web address].    Your next appointment:   12 month(s)  The format for your next appointment:   In Person  Provider:   Minus Breeding, MD

## 2021-01-28 LAB — GUARDANT 360

## 2021-04-20 IMAGING — CT CT ABD-PELV W/ CM
2 of 5 series · 12 of 36 positions shown, 15 images · IV contrast (APPLIED)
Comparison: CT abdomen 07/13/2012

CLINICAL DATA: Splenic flexure colon cancer diagnosed at
colonoscopy 05/22/2019

EXAM:
CT CHEST, ABDOMEN, AND PELVIS WITH CONTRAST
TECHNIQUE: Multidetector CT imaging of the chest, abdomen and pelvis was
performed following the standard protocol during bolus
administration of intravenous contrast.
CONTRAST:  100mL OMNIPAQUE IOHEXOL 300 MG/ML  SOLN

[Series 2: cap with · axial · 0.88mm/px · z∈[-655,-135]mm · 9 of 130 slices shown, 12 images]
[im 13/130  mediastinal]
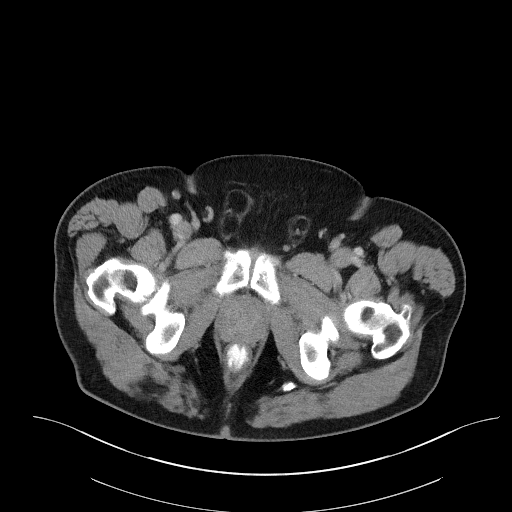
[im 13/130  lung]
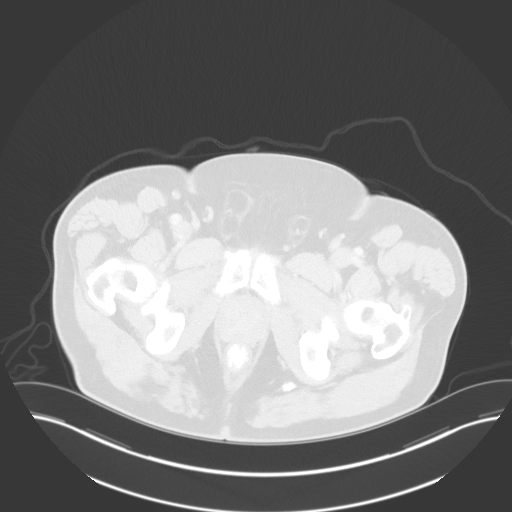
[im 26/130  lung]
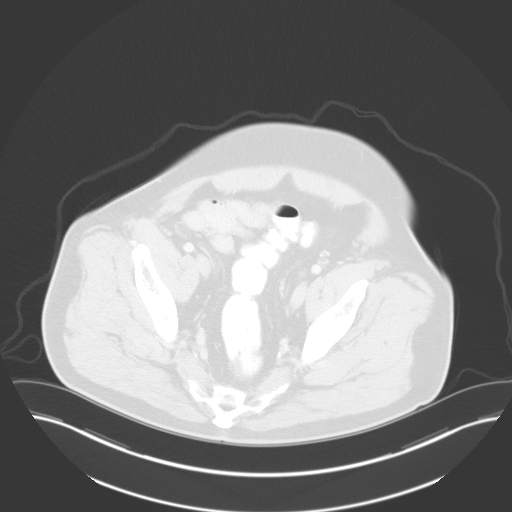
[im 39/130  lung]
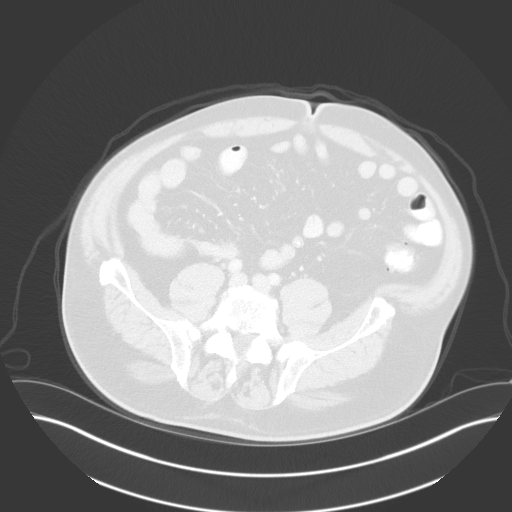
[im 52/130  lung]
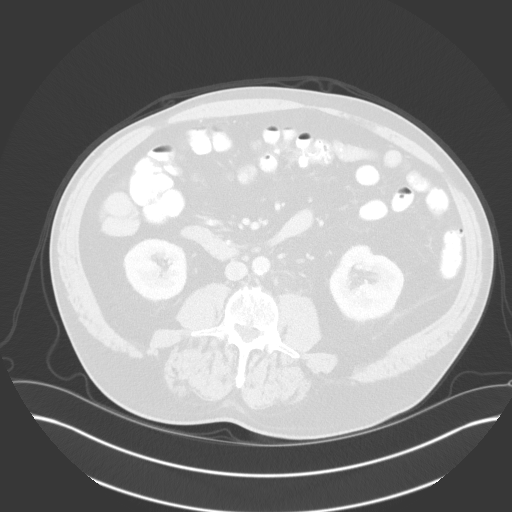
[im 65/130  mediastinal]
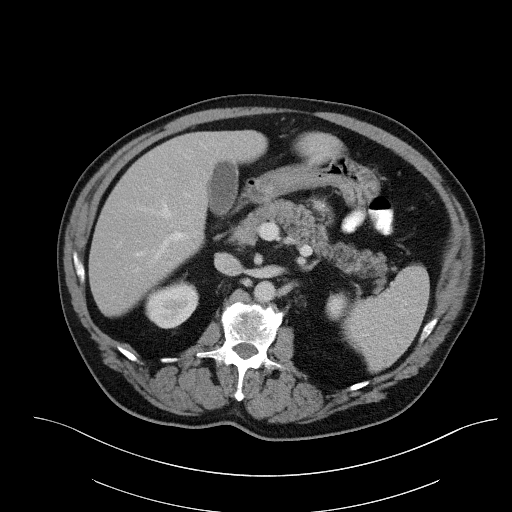
[im 65/130  lung]
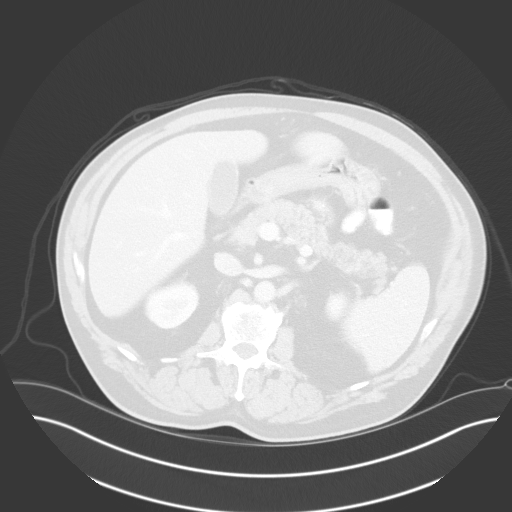
[im 78/130  lung]
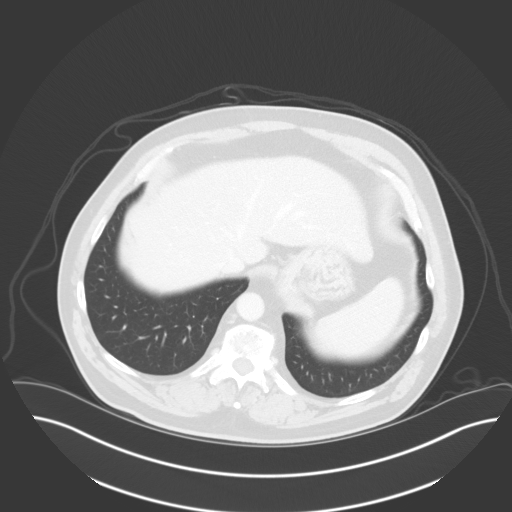
[im 91/130  lung]
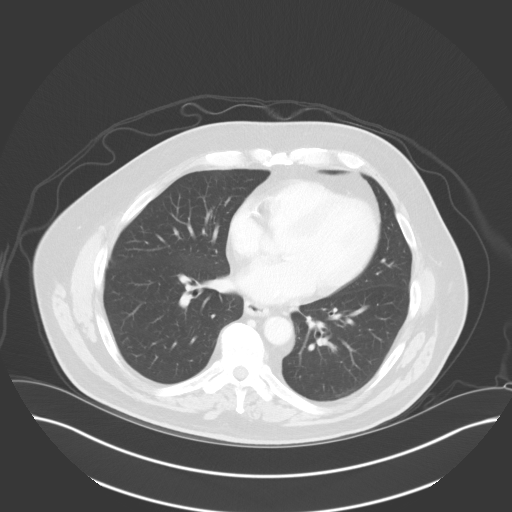
[im 104/130  lung]
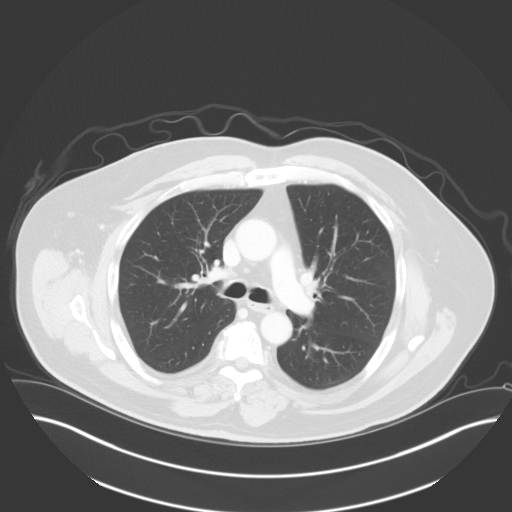
[im 117/130  mediastinal]
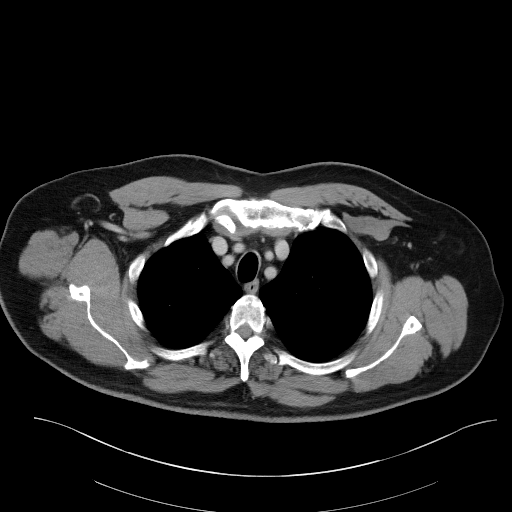
[im 117/130  lung]
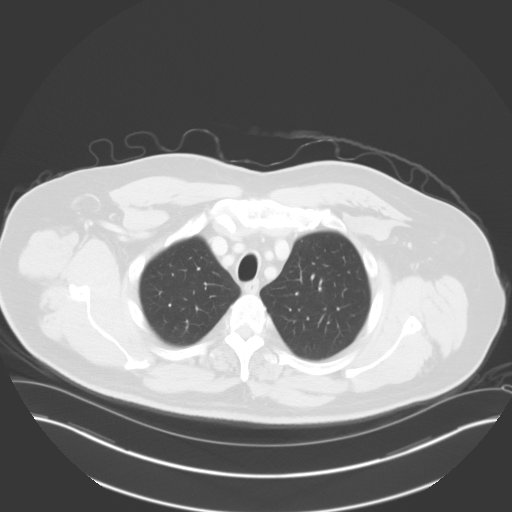

[Series 5: coronals · coronal · 0.87mm/px · 3 of 153 slices shown]
[im 31/153  lung]
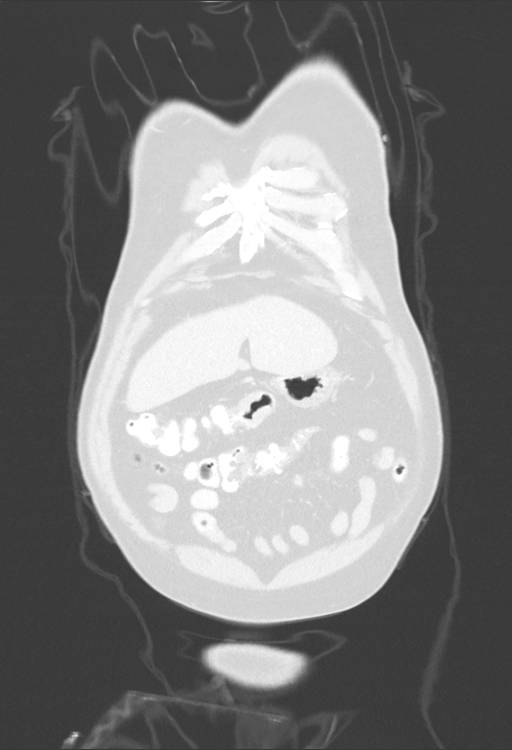
[im 61/153  lung]
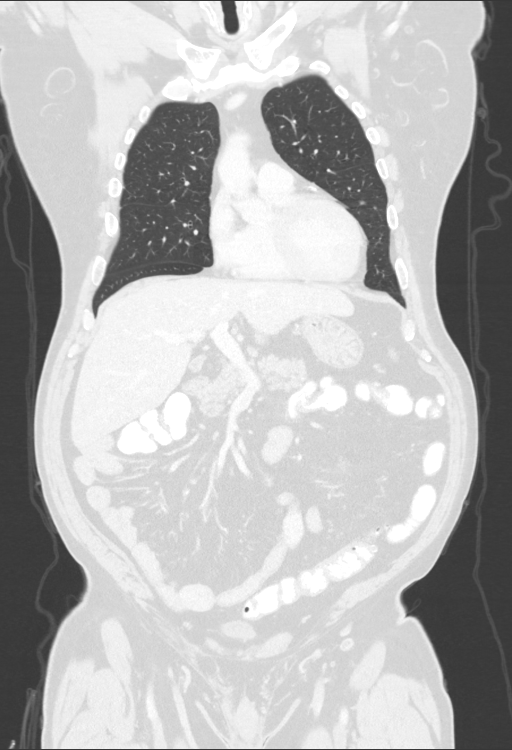
[im 92/153  lung]
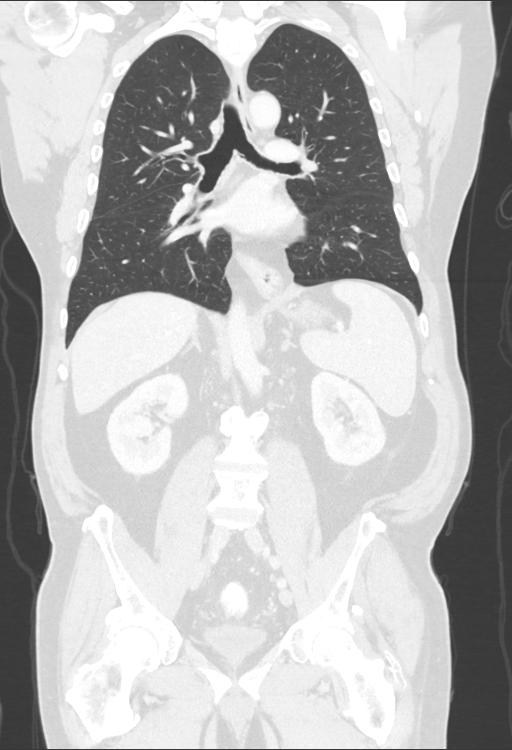

[12 of 36 positions shown; findings below may reference images not displayed]

FINDINGS: CT CHEST FINDINGS

Cardiovascular: The heart size is normal. No substantial pericardial
effusion. Coronary artery calcification is evident. No thoracic
aortic aneurysm.

Mediastinum/Nodes: No mediastinal lymphadenopathy. There is no hilar
lymphadenopathy. Tiny hiatal hernia. The esophagus has normal
imaging features. There is no axillary lymphadenopathy.

Lungs/Pleura: 4 mm subpleural nodule noted posterior right upper
lobe on 50/4. 2 mm right lower lobe nodule identified on 82/4. 1-2
mm anterior left upper lobe nodule visible on 33/4. Right upper lobe
granuloma seen on 61/4. No focal airspace consolidation. No pleural
effusion.

Musculoskeletal: No worrisome lytic or sclerotic osseous
abnormality.

CT ABDOMEN PELVIS FINDINGS

Hepatobiliary: 1.6 cm ill-defined low-density lesion is identified
in the medial segment left liver (57/2). A tiny 4-5 mm subtle
low-density lesion is seen in the posterior right liver on 65/2 and
is similar low-density lesions seen on 61/2 in the posterior right
liver. There is no evidence for gallstones, gallbladder wall
thickening, or pericholecystic fluid. There is no evidence for
gallstones, gallbladder wall thickening, or pericholecystic fluid.
No intrahepatic or extrahepatic biliary dilation.

Pancreas: No focal mass lesion. No dilatation of the main duct. No
intraparenchymal cyst. No peripancreatic edema.

Spleen: No splenomegaly. No focal mass lesion.

Adrenals/Urinary Tract: No adrenal nodule or mass. Kidneys
unremarkable. No evidence for hydroureter. The urinary bladder
appears normal for the degree of distention.

Stomach/Bowel: Tiny hiatal hernia. Stomach otherwise unremarkable.
Duodenum is normally positioned as is the ligament of Treitz. No
small bowel wall thickening. No small bowel dilatation. The cecum is
high in the midline abdomen, above the level of the umbilicus. The
terminal ileum is normal. The appendix is normal. Apple-core lesion
described on recent colonoscopy report is identified in the proximal
to mid transverse colon (see image 73/2 and well demonstrated on
coronal 33/5. This lesion measures about 3.8 cm in length in there
is no dilatation of the colon proximal to the lesion to suggest
obstruction. Oral contrast is distal to the lesion in the left
colon. Diverticular changes are noted in the left colon without
evidence of diverticulitis.

Vascular/Lymphatic: There is abdominal aortic atherosclerosis
without aneurysm. Upper normal rounded lymph nodes are seen in the
hepatoduodenal ligament measuring 10 mm short axis diameter. No
retroperitoneal lymphadenopathy. Small lymph nodes are seen in the
omentum just inferior to the transverse colon lesion (images 76 and
77 of series 2) No pelvic sidewall lymphadenopathy.

Reproductive: The prostate gland and seminal vesicles are
unremarkable.

Other: No intraperitoneal free fluid.

Musculoskeletal: No worrisome lytic or sclerotic osseous
abnormality.
IMPRESSION: 1. Apple-core lesion described on recent colonoscopy report is
identified in the proximal to mid transverse colon. This lesion
measures about 3.8 cm in length and is associated with small lymph
nodes in the omentum just inferior to the lesion.
2. 1.6 cm ill-defined low-density lesion in the medial segment left
liver, highly suspicious for metastatic disease. MRI abdomen with
and without contrast would likely prove helpful to further evaluate.
3. Upper normal hepatoduodenal ligament lymph nodes. Attention on
follow-up imaging recommended.
4. Tiny bilateral pulmonary nodules measuring up to 4 mm. Unlikely
to be metastatic, but attention on follow-up recommended.
5. Tiny hiatal hernia.
6. Aortic Atherosclerosis ([id number]).

## 2021-05-07 ENCOUNTER — Inpatient Hospital Stay: Payer: Medicare Other | Attending: Nurse Practitioner

## 2021-05-07 ENCOUNTER — Ambulatory Visit (HOSPITAL_COMMUNITY)
Admission: RE | Admit: 2021-05-07 | Discharge: 2021-05-07 | Disposition: A | Discharge status: Home / Self Care | Payer: Medicare Other | Source: Ambulatory Visit | Attending: Nurse Practitioner | Admitting: Nurse Practitioner

## 2021-05-07 ENCOUNTER — Other Ambulatory Visit: Payer: Self-pay

## 2021-05-07 DIAGNOSIS — I10 Essential (primary) hypertension: Secondary | ICD-10-CM | POA: Insufficient documentation

## 2021-05-07 DIAGNOSIS — C184 Malignant neoplasm of transverse colon: Secondary | ICD-10-CM

## 2021-05-07 DIAGNOSIS — Z79899 Other long term (current) drug therapy: Secondary | ICD-10-CM | POA: Insufficient documentation

## 2021-05-07 DIAGNOSIS — D122 Benign neoplasm of ascending colon: Secondary | ICD-10-CM | POA: Insufficient documentation

## 2021-05-07 DIAGNOSIS — K449 Diaphragmatic hernia without obstruction or gangrene: Secondary | ICD-10-CM | POA: Insufficient documentation

## 2021-05-07 DIAGNOSIS — C186 Malignant neoplasm of descending colon: Secondary | ICD-10-CM | POA: Diagnosis present

## 2021-05-07 DIAGNOSIS — Z87442 Personal history of urinary calculi: Secondary | ICD-10-CM | POA: Insufficient documentation

## 2021-05-07 DIAGNOSIS — I700 Atherosclerosis of aorta: Secondary | ICD-10-CM | POA: Insufficient documentation

## 2021-05-07 DIAGNOSIS — Z8719 Personal history of other diseases of the digestive system: Secondary | ICD-10-CM | POA: Insufficient documentation

## 2021-05-07 DIAGNOSIS — E119 Type 2 diabetes mellitus without complications: Secondary | ICD-10-CM | POA: Insufficient documentation

## 2021-05-07 DIAGNOSIS — I2510 Atherosclerotic heart disease of native coronary artery without angina pectoris: Secondary | ICD-10-CM | POA: Insufficient documentation

## 2021-05-07 DIAGNOSIS — I252 Old myocardial infarction: Secondary | ICD-10-CM | POA: Insufficient documentation

## 2021-05-07 DIAGNOSIS — C188 Malignant neoplasm of overlapping sites of colon: Secondary | ICD-10-CM | POA: Insufficient documentation

## 2021-05-07 LAB — CMP (CANCER CENTER ONLY)
ALT: 21 U/L (ref 0–44)
AST: 18 U/L (ref 15–41)
Albumin: 4.6 g/dL (ref 3.5–5.0)
Alkaline Phosphatase: 58 U/L (ref 38–126)
Anion gap: 8 (ref 5–15)
BUN: 10 mg/dL (ref 8–23)
CO2: 27 mmol/L (ref 22–32)
Calcium: 9.6 mg/dL (ref 8.9–10.3)
Chloride: 105 mmol/L (ref 98–111)
Creatinine: 0.95 mg/dL (ref 0.61–1.24)
GFR, Estimated: >60 mL/min (ref >60)
Glucose, Bld: 110 mg/dL — ABNORMAL HIGH (ref 70–99)
Potassium: 4 mmol/L (ref 3.5–5.1)
Sodium: 140 mmol/L (ref 135–145)
Total Bilirubin: 0.8 mg/dL (ref 0.3–1.2)
Total Protein: 7.8 g/dL (ref 6.5–8.1)

## 2021-05-07 LAB — CBC WITH DIFFERENTIAL (CANCER CENTER ONLY)
Abs Immature Granulocytes: 0.03 10*3/uL (ref 0.00–0.07)
Basophils Absolute: 0.1 10*3/uL (ref 0.0–0.1)
Basophils Relative: 1 %
Eosinophils Absolute: 0.1 10*3/uL (ref 0.0–0.5)
Eosinophils Relative: 1 %
HCT: 47.6 % (ref 39.0–52.0)
Hemoglobin: 16.8 g/dL (ref 13.0–17.0)
Immature Granulocytes: 0 %
Lymphocytes Relative: 25 %
Lymphs Abs: 2.1 10*3/uL (ref 0.7–4.0)
MCH: 33.7 pg (ref 26.0–34.0)
MCHC: 35.3 g/dL (ref 30.0–36.0)
MCV: 95.6 fL (ref 80.0–100.0)
Monocytes Absolute: 0.5 10*3/uL (ref 0.1–1.0)
Monocytes Relative: 5 %
Neutro Abs: 5.7 10*3/uL (ref 1.7–7.7)
Neutrophils Relative %: 68 %
Platelet Count: 135 10*3/uL — ABNORMAL LOW (ref 150–400)
RBC: 4.98 MIL/uL (ref 4.22–5.81)
RDW: 12.8 % (ref 11.5–15.5)
WBC Count: 8.4 10*3/uL (ref 4.0–10.5)
nRBC: 0 % (ref 0.0–0.2)

## 2021-05-07 LAB — CEA (IN HOUSE-CHCC): CEA (CHCC-In House): 3.33 ng/mL (ref 0.00–5.00)

## 2021-05-07 MED ORDER — SODIUM CHLORIDE (PF) 0.9 % IJ SOLN
INTRAMUSCULAR | Status: AC
  Filled 2021-05-07: qty 50

## 2021-05-07 MED ORDER — IOHEXOL 300 MG/ML  SOLN
100.0000 mL | Freq: Once | INTRAMUSCULAR | Status: AC | PRN
  Administered 2021-05-07: 100 mL via INTRAVENOUS

## 2021-05-10 ENCOUNTER — Inpatient Hospital Stay (HOSPITAL_BASED_OUTPATIENT_CLINIC_OR_DEPARTMENT_OTHER): Payer: Medicare Other | Admitting: Hematology

## 2021-05-10 ENCOUNTER — Other Ambulatory Visit: Payer: Self-pay

## 2021-05-10 VITALS — BP 153/64 | HR 70 | Temp 98.2°F | Resp 19 | Ht 64.0 in | Wt 199.9 lb

## 2021-05-10 DIAGNOSIS — C188 Malignant neoplasm of overlapping sites of colon: Secondary | ICD-10-CM | POA: Diagnosis present

## 2021-05-10 DIAGNOSIS — C184 Malignant neoplasm of transverse colon: Secondary | ICD-10-CM

## 2021-05-10 DIAGNOSIS — I10 Essential (primary) hypertension: Secondary | ICD-10-CM | POA: Diagnosis not present

## 2021-05-10 DIAGNOSIS — Z8719 Personal history of other diseases of the digestive system: Secondary | ICD-10-CM | POA: Diagnosis not present

## 2021-05-10 DIAGNOSIS — K449 Diaphragmatic hernia without obstruction or gangrene: Secondary | ICD-10-CM | POA: Diagnosis not present

## 2021-05-10 DIAGNOSIS — Z87442 Personal history of urinary calculi: Secondary | ICD-10-CM | POA: Diagnosis not present

## 2021-05-10 DIAGNOSIS — D122 Benign neoplasm of ascending colon: Secondary | ICD-10-CM | POA: Diagnosis not present

## 2021-05-10 DIAGNOSIS — E119 Type 2 diabetes mellitus without complications: Secondary | ICD-10-CM | POA: Diagnosis not present

## 2021-05-10 DIAGNOSIS — I700 Atherosclerosis of aorta: Secondary | ICD-10-CM | POA: Diagnosis not present

## 2021-05-10 DIAGNOSIS — Z79899 Other long term (current) drug therapy: Secondary | ICD-10-CM | POA: Diagnosis not present

## 2021-05-10 DIAGNOSIS — I252 Old myocardial infarction: Secondary | ICD-10-CM | POA: Diagnosis not present

## 2021-05-10 DIAGNOSIS — I2510 Atherosclerotic heart disease of native coronary artery without angina pectoris: Secondary | ICD-10-CM | POA: Diagnosis not present

## 2021-05-10 NOTE — Progress Notes (Signed)
[place]   Telephone:([phone]) [phone] [phone]   Clinic Follow up Note   Patient Care Team: [place], [place] as PCP - General Minus Breeding, MD as PCP - Cardiology (Cardiology) Michael Boston, MD as Consulting Physician (General Surgery) Minus Breeding, MD as Consulting Physician (Cardiology) Loletha Carrow Kirke Corin, MD as Consulting Physician (Gastroenterology) Truitt Merle, MD as Consulting Physician (Oncology)  Date of Service:  05/10/2021  CHIEF COMPLAINT: f/u of colon cancer  CURRENT THERAPY:  Surveillance  ASSESSMENT & PLAN:  [name] [name] is a 71 y.o. male with   1. Left side colon cancer, pT3N0M0, moderately differentiated, stage IIA -Diagnosed 05/2019, s/p surgical resection by Dr Johney Maine on 08/01/19. Path showed T3 disease, 0/31+ nodes, negative margins. There were no high risk features except moderate differentiation. Adjuvant chemo was not recommended  -Guardant reveal ctDNA q4 months has been undetected thus far, repeated 05/07/21 and level is pending  -surveillance CT CAP on 05/07/21 NED, I reviewed and discussed with him today  -He has not had surveillance colonoscopy. I again encourage him to schedule with Dr.Danis  -we will continue 6 month f/u for 3 more years.    2.  COPD, coronary artery disease, status post stent placement, diabetes, hypertension, arthritis -Follow-up with PCP and cardiology -stable   PLAN: -Lab and scan reviewed, NED -he is overdue for colonoscopy  -lab and f/u in 6 months   No problem-specific Assessment & Plan notes found for this encounter.   SUMMARY OF ONCOLOGIC HISTORY: Oncology History Overview Note   Cancer Staging  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[facility]) Staging form: Colon and Rectum, AJCC 8th Edition - Pathologic stage from 08/01/2019: Stage IIA (pT3, pN0, cM0) - Signed by Truitt Merle, MD on 09/05/2019 Total positive nodes: 0 Histologic grading system: 4 grade system Histologic grade (G):  G2 Residual tumor (R): R0 - None     Cancer of left colon ([place]) (Resolved)  05/22/2019 Procedure   Colonoscopy by Dr Loletha Carrow 05/22/19  IMPRESSION - A fullness with overlying mild erythema right peri-anal area. Purulent material found on perianal exam. - The examined portion of the ileum was normal. - Three 3 to 5 mm polyps in the rectum and in the ascending colon, removed with a cold snare. Resected and retrieved. - Diverticulosis in the left colon. - Likely malignant partially obstructing tumor at the splenic flexure. Biopsied. Tattooed. - The examination was otherwise normal on direct and retroflexion views.   05/22/2019 Initial Biopsy   Diagnosis 05/22/19 1. Ascending Colon Polyp, rectal, polyps, 3 - TUBULAR ADENOMA(S) WITHOUT HIGH-GRADE DYSPLASIA OR MALIGNANCY - INFLAMMATORY POLYP(S) 2. Descending Colon Biopsy, colon mass - ADENOCARCINOMA. SEE NOTE   05/31/2019 Imaging   CT CAP W Contrast 05/31/19  IMPRESSION: 1. Apple-core lesion described on recent colonoscopy report is identified in the proximal to mid transverse colon. This lesion measures about 3.8 cm in length and is associated with small lymph nodes in the omentum just inferior to the lesion. 2. 1.6 cm ill-defined low-density lesion in the medial segment left liver, highly suspicious for metastatic disease. MRI abdomen with and without contrast would likely prove helpful to further evaluate. 3. Upper normal hepatoduodenal ligament lymph nodes. Attention on follow-up imaging recommended. 4. Tiny bilateral pulmonary nodules measuring up to 4 mm. Unlikely to be metastatic, but attention on follow-up recommended. 5. Tiny hiatal hernia. 6. Aortic Atherosclerosis (ICD10-I70.0).   06/03/2019 Initial Diagnosis   Cancer of left colon ([place])   06/05/2019 Imaging  MRI abdomen  IMPRESSION: 1. 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 No definite  signs of metastatic disease to the liver. 2. Probable iron deposition in the spleen. 3. Aortic atherosclerosis.     08/01/2019 Surgery   Surgery by Dr Johney Maine 08/01/19  XI ROBOTIC EXTENDED RIGHT COLECTOMY, OMENTALPEXY, BILATERAL TAP BLOCKS; ASSESSMENT OF TISSUE USING FIREFLY FLUORESCENCE; ANAL FISTULA REPAIR, HEMORRHOID LIGATION AND PEXY    08/01/2019 Pathology Results   FINAL MICROSCOPIC DIAGNOSIS: 08/01/19 A. COLON, PROXIMAL RIGHT COLON TO SPLENIC FLEXURE, RESECTION:  -  Adenocarcinoma, moderately differentiated, 4.0 cm  -  No carcinoma identified in thirty-one lymph nodes (0/31)  -  Margins uninvolved by carcinoma  -  See oncology table and comment below   B. SKIN, RIGHT ANTERIOR PERIRECTAL TRACT, EXCISION:  -  Squamous mucosa with underlying chronic inflammation and granulation  tissue  -  No malignancy identified   C. SKIN, LEFT POSTERIOR HEMORRHOID, EXCISION:  -  Squamous mucosa with dilated submucosal vessels consistent with  hemorrhoid(s)  -  No malignancy identified    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([place])  08/01/2019 Initial Diagnosis   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([place])   08/01/2019 Cancer Staging   Staging form: Colon and Rectum, AJCC 8th Edition - Pathologic stage from 08/01/2019: Stage IIA (pT3, pN0, cM0) - Signed by Truitt Merle, MD on 09/05/2019    05/07/2021 Imaging   EXAM: CT CHEST, ABDOMEN, AND PELVIS WITH CONTRAST  IMPRESSION: No findings to suggest locally recurrent or metastatic disease.   Status post right hemicolectomy.      INTERVAL HISTORY:  [name] [name] is here for a follow up of colon cancer. He was last seen by NP Lacie on 01/04/21. He presents to the clinic alone. He reports he is doing well overall.   All other systems were reviewed with the patient and are negative.  MEDICAL HISTORY:  Past Medical History:  Diagnosis Date   Arthritis    Colon cancer ([place])    COPD (chronic obstructive pulmonary disease) (HCC)    mild per pt.   Coronary  artery disease    cardiac catheterization on January 26,2010, right coronary arter distal occlusion with stenting of this with xience drug-eluting stent.  Pt also had liminal irregularities  in the LAD and 40-50% mid circumflex  stenosis   Diabetes mellitus without complication (HCC)    Dyspnea    exersion only   GERD (gastroesophageal reflux disease)    History of colon polyps    History of kidney stones    Hyperlipidemia    Hypertension    Myocardial infarction [facility]) 2010   Perirectal abscess    Pneumonia 2013   Stomach ulcer 1970's    SURGICAL HISTORY: Past Surgical History:  Procedure Laterality Date   COLONOSCOPY     CORONARY ANGIOPLASTY WITH STENT PLACEMENT  2010   POLYPECTOMY      I have reviewed the social history and family history with the patient and they are unchanged from previous note.  ALLERGIES:  is allergic to clopidogrel bisulfate.  MEDICATIONS:  Current Outpatient Medications  Medication Sig Dispense Refill   aspirin EC 81 MG tablet Take 81 mg by mouth every evening.      b complex vitamins tablet Take 1 tablet by mouth every evening.      cyclobenzaprine (FLEXERIL) 10 MG tablet Take 10 mg by mouth at bedtime.     lisinopril (PRINIVIL,ZESTRIL) 10 MG tablet  TAKE 1 TABLET BY MOUTH EVERY DAY (Patient taking differently: Take 10 mg by mouth every evening. TAKE 1 TABLET BY MOUTH EVERY DAY) 30 tablet 3   meloxicam (MOBIC) 15 MG tablet Take 15 mg by mouth daily.     metFORMIN (GLUCOPHAGE) 500 MG tablet Take 500 mg by mouth every evening.      naproxen sodium (ALEVE) 220 MG tablet Take 220-440 mg by mouth 2 (two) times daily as needed (pain.).     nitroGLYCERIN (NITROSTAT) 0.4 MG SL tablet Place 1 tablet (0.4 mg total) under the tongue every 5 (five) minutes as needed. 25 tablet 2   pantoprazole (PROTONIX) 40 MG tablet Take 40 mg by mouth every other day. In the evening.     simvastatin (ZOCOR) 40 MG tablet TAKE 1 TABLET (40 MG TOTAL) BY MOUTH AT BEDTIME. (Patient  taking differently: Take 40 mg by mouth every evening.) 30 tablet 6   vitamin B-12 (CYANOCOBALAMIN) 1000 MCG tablet Take 1,000 mcg by mouth every evening.     Current Facility-Administered Medications  Medication Dose Route Frequency Provider Last Rate Last Admin   0.9 %  sodium chloride infusion  500 mL Intravenous Once Danis, Estill Cotta III, MD        PHYSICAL EXAMINATION: ECOG PERFORMANCE STATUS: 0 - Asymptomatic  Vitals:   05/10/21 0900  BP: (!) 153/64  Pulse: 70  Resp: 19  Temp: 98.2 F (36.8 C)  SpO2: 100%   Wt Readings from Last 3 Encounters:  05/10/21 199 lb 14.4 oz (90.7 kg)  01/18/21 201 lb (91.2 kg)  01/04/21 200 lb 9.6 oz (91 kg)     GENERAL:alert, no distress and comfortable SKIN: skin color, texture, turgor are normal, no rashes or significant lesions EYES: normal, Conjunctiva are pink and non-injected, sclera clear  LUNGS: clear to auscultation and percussion with normal breathing effort HEART: regular rate & rhythm and no murmurs and no lower extremity edema ABDOMEN:abdomen soft, non-tender and normal bowel sounds Musculoskeletal:no cyanosis of digits and no clubbing  NEURO: alert & oriented x 3 with fluent speech, no focal motor/sensory deficits  LABORATORY DATA:  I have reviewed the data as listed CBC Latest Ref Rng & Units 05/07/2021 01/04/2021 09/03/2020  WBC 4.0 - 10.5 K/uL 8.4 7.5 8.1  Hemoglobin 13.0 - 17.0 g/dL 16.8 16.2 15.9  Hematocrit 39.0 - 52.0 % 47.6 46.8 44.9  Platelets 150 - 400 K/uL 135(L) 132(L) 122(L)     CMP Latest Ref Rng & Units 05/07/2021 01/04/2021 09/03/2020  Glucose 70 - 99 mg/dL 110(H) 114(H) 114(H)  BUN 8 - 23 mg/dL 10 14 11   Creatinine 0.61 - 1.24 mg/dL 0.95 0.95 0.90  Sodium 135 - 145 mmol/L 140 142 142  Potassium 3.5 - 5.1 mmol/L 4.0 4.3 4.5  Chloride 98 - 111 mmol/L 105 106 106  CO2 22 - 32 mmol/L 27 26 26   Calcium 8.9 - 10.3 mg/dL 9.6 9.4 9.1  Total Protein 6.5 - 8.1 g/dL 7.8 7.3 7.3  Total Bilirubin 0.3 - 1.2 mg/dL 0.8 0.8  0.9  Alkaline Phos 38 - 126 U/L 58 64 71  AST 15 - 41 U/L 18 20 18   ALT 0 - 44 U/L 21 27 22       RADIOGRAPHIC STUDIES: I have personally reviewed the radiological images as listed and agreed with the findings in the report. No results found.    No orders of the defined types were placed in this encounter.  All questions were answered. The patient knows to  call the clinic with any problems, questions or concerns. No barriers to learning was detected.      Truitt Merle, MD 05/10/2021   I, Wilburn Mylar, am acting as scribe for Truitt Merle, MD.   I have reviewed the above documentation for accuracy and completeness, and I agree with the above.

## 2021-05-11 ENCOUNTER — Telehealth: Payer: Self-pay | Admitting: Gastroenterology

## 2021-05-11 NOTE — Telephone Encounter (Signed)
Called and LM for patient to call back to be scheduled for direct colon with Dr. Loletha Carrow.

## 2021-05-11 NOTE — Telephone Encounter (Signed)
Vivien Rota,    I rec'd a message from this patient's oncologist Burr Medico) noting that he has not had a surveillance colonoscopy since his surgical resection of a splenic flexure adenocarcinoma in April 2021.  I reviewed the recent oncology office note and his most recent cardiology follow-up note in October 2022, and he appears appropriate for direct book colonoscopy in the [facility].  Please contact this patient and arrange an [place] nurse previsit to schedule colonoscopy with me.  - HD

## 2021-05-12 ENCOUNTER — Encounter: Payer: Self-pay | Admitting: Gastroenterology

## 2021-05-12 NOTE — Telephone Encounter (Signed)
Patient returned your call, Was scheduled for PV on 3/15 at 9:00 and procedure 3/29 at 8:00 with Dr. Loletha Carrow.

## 2021-05-18 ENCOUNTER — Other Ambulatory Visit: Payer: Self-pay

## 2021-06-16 ENCOUNTER — Ambulatory Visit (AMBULATORY_SURGERY_CENTER): Payer: Medicare Other | Admitting: *Deleted

## 2021-06-16 ENCOUNTER — Other Ambulatory Visit: Payer: Self-pay

## 2021-06-16 VITALS — Ht 64.0 in | Wt 200.0 lb

## 2021-06-16 DIAGNOSIS — C184 Malignant neoplasm of transverse colon: Secondary | ICD-10-CM

## 2021-06-16 DIAGNOSIS — Z86010 Personal history of colonic polyps: Secondary | ICD-10-CM

## 2021-06-16 MED ORDER — PEG 3350-KCL-NA BICARB-NACL 420 G PO SOLR: 4000.0000 mL | Freq: Once | ORAL | 0 refills | Status: AC

## 2021-06-16 NOTE — Progress Notes (Signed)

## 2021-06-17 ENCOUNTER — Telehealth: Payer: Self-pay | Admitting: *Deleted

## 2021-06-17 LAB — GUARDANT 360

## 2021-06-17 NOTE — Telephone Encounter (Signed)
-----   Message from Alla Feeling, NP sent at 06/17/2021  1:58 PM EDT ----- ?Please let pt know guardant reveal circulating tumor DNA is not detected, good news!  ?Thanks, ?Lacie NP ?

## 2021-06-17 NOTE — Telephone Encounter (Signed)
Per Cira Rue, NP, call pt with message below. Pt verbalized understanding and was appreciative.  ?

## 2021-06-20 ENCOUNTER — Encounter: Payer: Self-pay | Admitting: Certified Registered Nurse Anesthetist

## 2021-06-29 ENCOUNTER — Encounter: Payer: Self-pay | Admitting: Certified Registered Nurse Anesthetist

## 2021-06-30 ENCOUNTER — Ambulatory Visit (AMBULATORY_SURGERY_CENTER): Payer: Medicare Other | Admitting: Gastroenterology

## 2021-06-30 ENCOUNTER — Other Ambulatory Visit: Payer: Self-pay

## 2021-06-30 ENCOUNTER — Encounter: Payer: Self-pay | Admitting: Gastroenterology

## 2021-06-30 VITALS — BP 136/76 | HR 63 | Temp 97.1°F | Resp 15 | Ht 64.0 in | Wt 200.0 lb

## 2021-06-30 DIAGNOSIS — K635 Polyp of colon: Secondary | ICD-10-CM

## 2021-06-30 DIAGNOSIS — Z85038 Personal history of other malignant neoplasm of large intestine: Secondary | ICD-10-CM | POA: Diagnosis not present

## 2021-06-30 DIAGNOSIS — D123 Benign neoplasm of transverse colon: Secondary | ICD-10-CM

## 2021-06-30 MED ORDER — SODIUM CHLORIDE 0.9 % IV SOLN: 500.0000 mL | Freq: Once | INTRAVENOUS | Status: DC

## 2021-06-30 NOTE — Progress Notes (Signed)
History and Physical: ? This patient presents for endoscopic testing for: ?Encounter Diagnoses  ?Name Primary?  ? Personal history of colonic polyps Yes  ? 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 ? ?Resection April  2021 for transverse colon cancer and anal fistulotomy ?Stools more frequent or loose since surgery ?Denies rectal bleeding ? ?ROS: ?Patient denies chest pain or shortness of breath ? ? ?Past Medical History: ?Past Medical History:  ?Diagnosis Date  ? Arthritis   ? Colon cancer ([place])   ? COPD (chronic obstructive pulmonary disease) ([place])   ? mild per pt.  ? Coronary artery disease   ? cardiac catheterization on January 26,2010, right coronary arter distal occlusion with stenting of this with xience drug-eluting stent.  Pt also had liminal irregularities  in the LAD and 40-50% mid circumflex  stenosis  ? Diabetes mellitus without complication ([place])   ? Dyspnea   ? exersion only  ? GERD (gastroesophageal reflux disease)   ? History of colon polyps   ? History of kidney stones   ? Hyperlipidemia   ? Hypertension   ? Myocardial infarction [facility]) 2010  ? Perirectal abscess   ? Pneumonia 2013  ? Stomach ulcer 1970's  ? ? ? ?Past Surgical History: ?Past Surgical History:  ?Procedure Laterality Date  ? COLONOSCOPY    ? CORONARY ANGIOPLASTY WITH STENT PLACEMENT  2010  ? POLYPECTOMY    ? ? ?Allergies: ?Allergies  ?Allergen Reactions  ? Clopidogrel Bisulfate Hives  ?  Plavix="hives"  ? ? ?Outpatient Meds: ?Current Outpatient Medications  ?Medication Sig Dispense Refill  ? aspirin EC 81 MG tablet Take 81 mg by mouth every evening.     ? b complex vitamins tablet Take 1 tablet by mouth every evening.     ? lisinopril (PRINIVIL,ZESTRIL) 10 MG tablet TAKE 1 TABLET BY MOUTH EVERY DAY (Patient taking differently: Take 10 mg by mouth every evening. TAKE 1 TABLET BY MOUTH EVERY DAY) 30 tablet 3  ? meloxicam (MOBIC) 15 MG tablet Take 15 mg by mouth daily.    ? metFORMIN (GLUCOPHAGE) 500 MG tablet Take 500 mg by mouth every  evening.     ? ONETOUCH ULTRA test strip 2 (two) times daily.    ? pantoprazole (PROTONIX) 40 MG tablet Take 40 mg by mouth every other day. In the evening.    ? simvastatin (ZOCOR) 40 MG tablet TAKE 1 TABLET (40 MG TOTAL) BY MOUTH AT BEDTIME. (Patient taking differently: Take 40 mg by mouth every evening.) 30 tablet 6  ? vitamin B-12 (CYANOCOBALAMIN) 1000 MCG tablet Take 1,000 mcg by mouth every evening.    ? cyclobenzaprine (FLEXERIL) 10 MG tablet Take 10 mg by mouth at bedtime. (Patient not taking: Reported on 06/16/2021)    ? naproxen sodium (ALEVE) 220 MG tablet Take 220-440 mg by mouth 2 (two) times daily as needed (pain.). (Patient not taking: Reported on 06/16/2021)    ? nitroGLYCERIN (NITROSTAT) 0.4 MG SL tablet Place 1 tablet (0.4 mg total) under the tongue every 5 (five) minutes as needed. 25 tablet 2  ? ?Current Facility-Administered Medications  ?Medication Dose Route Frequency Provider Last Rate Last Admin  ? 0.9 %  sodium chloride infusion  500 mL Intravenous Once Nelida Meuse III, MD      ? 0.9 %  sodium chloride infusion  500 mL Intravenous Once Doran Stabler, MD      ? ? ? ? ?___________________________________________________________________ ?Objective  ? ?Exam: ? ?BP 134/70  Pulse 75   Temp (!) 97.1 ?F (36.2 ?C) (Temporal)   Ht 5' 4"  (1.626 m)   Wt 200 lb (90.7 kg)   SpO2 98%   BMI 34.33 kg/m?  ? ?CV: RRR without murmur, S1/S2 ?Resp: clear to auscultation bilaterally, normal RR and effort noted ?GI: soft, no tenderness, with active bowel sounds. ? ? ?Assessment: ?Encounter Diagnoses  ?Name Primary?  ? Personal history of colonic polyps Yes  ? 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 ? ? ?Plan: ?Colonoscopy ? The benefits and risks of the planned procedure were described in detail with the patient or (when appropriate) their health care proxy.  Risks were outlined as including, but not limited to, bleeding, infection, perforation, adverse medication reaction leading to cardiac or pulmonary  decompensation, pancreatitis (if ERCP).  The limitation of incomplete mucosal visualization was also discussed.  No guarantees or warranties were given. ? ? ? ?The patient is appropriate for an endoscopic procedure in the ambulatory setting. ? ? - Wilfrid Lund, MD ? ? ? ? ?

## 2021-06-30 NOTE — Progress Notes (Signed)
VS completed by DT.  Pt's states no medical or surgical changes since previsit or office visit.  

## 2021-06-30 NOTE — Progress Notes (Signed)
Called to room to assist during endoscopic procedure.  Patient ID and intended procedure confirmed with present staff. Received instructions for my participation in the procedure from the performing physician.  

## 2021-06-30 NOTE — Op Note (Signed)
[place] ?Patient Name: [name] [name] ?Procedure Date: 06/30/2021 7:55 AM ?MRN: [id number] ?Endoscopist: Estill Cotta. Loletha Carrow , MD ?Age: 71 ?Referring MD:  ?Date of Birth: [birth year] ?Gender: Male ?Account #: [id number] ?Procedure:                Colonoscopy ?Indications:              High risk colon cancer surveillance: Personal  ?                          history of colon cancer ?                          T3N0 transverse colon cancer - extended right  ?                          hemicolectomy April 2021. Has hemorrhoid pexy and  ?                          anal fistula repair at same time ?Medicines:                Monitored Anesthesia Care ?Procedure:                Pre-Anesthesia Assessment: ?                          - Prior to the procedure, a History and Physical  ?                          was performed, and patient medications and  ?                          allergies were reviewed. The patient's tolerance of  ?                          previous anesthesia was also reviewed. The risks  ?                          and benefits of the procedure and the sedation  ?                          options and risks were discussed with the patient.  ?                          All questions were answered, and informed consent  ?                          was obtained. Prior Anticoagulants: The patient has  ?                          taken no previous anticoagulant or antiplatelet  ?                          agents. ASA Grade Assessment: III - A patient with  ?  severe systemic disease. After reviewing the risks  ?                          and benefits, the patient was deemed in  ?                          satisfactory condition to undergo the procedure. ?                          After obtaining informed consent, the colonoscope  ?                          was passed under direct vision. Throughout the  ?                          procedure, the patient's blood pressure, pulse, and  ?                           oxygen saturations were monitored continuously. The  ?                          CF [id number] #[id number] was introduced through the anus  ?                          and advanced to the the ileocolonic anastomosis and  ?                          neo TI. The colonoscopy was performed without  ?                          difficulty. The patient tolerated the procedure  ?                          well. The quality of the bowel preparation was  ?                          good. The neo-terminal ileum, the rectum and  ?                          ileo-colonic anastomosis were photographed. The  ?                          bowel preparation used was GoLYTELY. ?Scope In: 8:06:30 AM ?Scope Out: 8:20:49 AM ?Scope Withdrawal Time: 0 hours 12 minutes 35 seconds  ?Total Procedure Duration: 0 hours 14 minutes 19 seconds  ?Findings:                 The perianal and digital rectal examinations were  ?                          normal. ?                          There was evidence of a prior side-to-side  ?  ileo-colonic anastomosis in the mid transverse  ?                          colon. This was patent and was characterized by  ?                          healthy appearing mucosa. The neo-TI was normal ?                          A diminutive polyp was found in the anastomosis  ?                          (adjacent to tattoo). The polyp was sessile. The  ?                          polyp was removed with a cold snare. Resection and  ?                          retrieval were complete. ?                          A tattoo was seen at the anastomosis. ?                          A 4 mm polyp was found in the distal transverse  ?                          colon. The polyp was semi-sessile. The polyp was  ?                          removed with a cold snare. Resection and retrieval  ?                          were complete. ?                          Multiple diverticula were found in the left colon. ?                           The exam was otherwise without abnormality on  ?                          direct and retroflexion views. ?Complications:            No immediate complications. ?Estimated Blood Loss:     Estimated blood loss was minimal. ?Impression:               - Patent side-to-side ileo-colonic anastomosis,  ?                          characterized by healthy appearing mucosa. ?                          - One diminutive polyp at the anastomosis, removed  ?  with a cold snare. Resected and retrieved. ?                          - A tattoo was seen at the colonic anastomosis. ?                          - One 4 mm polyp in the distal transverse colon,  ?                          removed with a cold snare. Resected and retrieved. ?                          - Diverticulosis in the left colon. ?                          - The examination was otherwise normal on direct  ?                          and retroflexion views. ?Recommendation:           - Patient has a contact number available for  ?                          emergencies. The signs and symptoms of potential  ?                          delayed complications were discussed with the  ?                          patient. Return to normal activities tomorrow.  ?                          Written discharge instructions were provided to the  ?                          patient. ?                          - Resume previous diet. ?                          - Continue present medications. ?                          - Await pathology results. ?                          - Repeat colonoscopy in 3 years for surveillance. ?[name] L. Loletha Carrow, MD ?06/30/2021 8:32:18 AM ?This report has been signed electronically. ?

## 2021-06-30 NOTE — Patient Instructions (Addendum)
Handout on polyps and diverticulosis provided  ? ?Await pathology results.  ? ?Continue current medications.  ? ?Repeat colonoscopy in 3 years for surveillance  ? ?YOU HAD AN ENDOSCOPIC PROCEDURE TODAY AT [facility]:   Refer to the procedure report that was given to you for any specific questions about what was found during the examination.  If the procedure report does not answer your questions, please call your gastroenterologist to clarify.  If you requested that your care partner not be given the details of your procedure findings, then the procedure report has been included in a sealed envelope for you to review at your convenience later. ? ?YOU SHOULD EXPECT: Some feelings of bloating in the abdomen. Passage of more gas than usual.  Walking can help get rid of the air that was put into your GI tract during the procedure and reduce the bloating. If you had a lower endoscopy (such as a colonoscopy or flexible sigmoidoscopy) you may notice spotting of blood in your stool or on the toilet paper. If you underwent a bowel prep for your procedure, you may not have a normal bowel movement for a few days. ? ?Please Note:  You might notice some irritation and congestion in your nose or some drainage.  This is from the oxygen used during your procedure.  There is no need for concern and it should clear up in a day or so. ? ?SYMPTOMS TO REPORT IMMEDIATELY: ? ?Following lower endoscopy (colonoscopy or flexible sigmoidoscopy): ? Excessive amounts of blood in the stool ? Significant tenderness or worsening of abdominal pains ? Swelling of the abdomen that is new, acute ? Fever of 100?F or higher ? ?For urgent or emergent issues, a gastroenterologist can be reached at any hour by calling [phone]. ?Do not use MyChart messaging for urgent concerns.  ? ? ?DIET:  We do recommend a small meal at first, but then you may proceed to your regular diet.  Drink plenty of fluids but you should avoid alcoholic  beverages for 24 hours. ? ?ACTIVITY:  You should plan to take it easy for the rest of today and you should NOT DRIVE or use heavy machinery until tomorrow (because of the sedation medicines used during the test).   ? ?FOLLOW UP: ?Our staff will call the number listed on your records 48-72 hours following your procedure to check on you and address any questions or concerns that you may have regarding the information given to you following your procedure. If we do not reach you, we will leave a message.  We will attempt to reach you two times.  During this call, we will ask if you have developed any symptoms of COVID 19. If you develop any symptoms (ie: fever, flu-like symptoms, shortness of breath, cough etc.) before then, please call [phone].  If you test positive for Covid 19 in the 2 weeks post procedure, please call and report this information to [place].   ? ?If any biopsies were taken you will be contacted by phone or by letter within the next 1-3 weeks.  Please call us at [phone] if you have not heard about the biopsies in 3 weeks.  ? ? ?SIGNATURES/CONFIDENTIALITY: ?You and/or your care partner have signed paperwork which will be entered into your electronic medical record.  These signatures attest to the fact that that the information above on your After Visit Summary has been reviewed and is understood.  Full responsibility of the confidentiality of this discharge  information lies with you and/or your care-partner. ? ? ?

## 2021-06-30 NOTE — Progress Notes (Signed)
Report given to PACU, vss 

## 2021-07-02 ENCOUNTER — Telehealth: Payer: Self-pay

## 2021-07-02 NOTE — Telephone Encounter (Signed)
?  Follow up Call- ? ? ?  06/30/2021  ?  7:16 AM 05/22/2019  ?  7:55 AM  ?Call back number  ?Post procedure Call Back phone  # [phone]  ?Permission to leave phone message Yes Yes  ?  ? ?Patient questions: ? ?Do you have a fever, pain , or abdominal swelling? No. ?Pain Score  0 * ? ?Have you tolerated food without any problems? Yes.   ? ?Have you been able to return to your normal activities? Yes.   ? ?Do you have any questions about your discharge instructions: ?Diet   No. ?Medications  No. ?Follow up visit  No. ? ?Do you have questions or concerns about your Care? No. ? ?Actions: ?* If pain score is 4 or above: ?No action needed, pain <4. ? ? ?

## 2021-07-07 ENCOUNTER — Encounter: Payer: Self-pay | Admitting: Gastroenterology

## 2021-11-08 ENCOUNTER — Inpatient Hospital Stay (HOSPITAL_BASED_OUTPATIENT_CLINIC_OR_DEPARTMENT_OTHER): Payer: Medicare Other | Admitting: Hematology

## 2021-11-08 ENCOUNTER — Encounter: Payer: Self-pay | Admitting: Hematology

## 2021-11-08 ENCOUNTER — Inpatient Hospital Stay: Payer: Medicare Other | Attending: Hematology

## 2021-11-08 VITALS — BP 156/69 | HR 66 | Temp 97.7°F | Resp 16 | Ht 64.0 in | Wt 201.1 lb

## 2021-11-08 DIAGNOSIS — C184 Malignant neoplasm of transverse colon: Secondary | ICD-10-CM

## 2021-11-08 DIAGNOSIS — Z79899 Other long term (current) drug therapy: Secondary | ICD-10-CM | POA: Insufficient documentation

## 2021-11-08 DIAGNOSIS — Z8719 Personal history of other diseases of the digestive system: Secondary | ICD-10-CM | POA: Insufficient documentation

## 2021-11-08 DIAGNOSIS — E119 Type 2 diabetes mellitus without complications: Secondary | ICD-10-CM | POA: Insufficient documentation

## 2021-11-08 DIAGNOSIS — C188 Malignant neoplasm of overlapping sites of colon: Secondary | ICD-10-CM | POA: Insufficient documentation

## 2021-11-08 DIAGNOSIS — I252 Old myocardial infarction: Secondary | ICD-10-CM | POA: Insufficient documentation

## 2021-11-08 DIAGNOSIS — Z87442 Personal history of urinary calculi: Secondary | ICD-10-CM | POA: Insufficient documentation

## 2021-11-08 DIAGNOSIS — I700 Atherosclerosis of aorta: Secondary | ICD-10-CM | POA: Insufficient documentation

## 2021-11-08 DIAGNOSIS — D122 Benign neoplasm of ascending colon: Secondary | ICD-10-CM | POA: Insufficient documentation

## 2021-11-08 DIAGNOSIS — K449 Diaphragmatic hernia without obstruction or gangrene: Secondary | ICD-10-CM | POA: Insufficient documentation

## 2021-11-08 DIAGNOSIS — I10 Essential (primary) hypertension: Secondary | ICD-10-CM | POA: Diagnosis not present

## 2021-11-08 DIAGNOSIS — I2510 Atherosclerotic heart disease of native coronary artery without angina pectoris: Secondary | ICD-10-CM | POA: Diagnosis not present

## 2021-11-08 LAB — CBC WITH DIFFERENTIAL (CANCER CENTER ONLY)
Abs Immature Granulocytes: 0.02 10*3/uL (ref 0.00–0.07)
Basophils Absolute: 0.1 10*3/uL (ref 0.0–0.1)
Basophils Relative: 1 %
Eosinophils Absolute: 0.2 10*3/uL (ref 0.0–0.5)
Eosinophils Relative: 2 %
HCT: 43.9 % (ref 39.0–52.0)
Hemoglobin: 16 g/dL (ref 13.0–17.0)
Immature Granulocytes: 0 %
Lymphocytes Relative: 24 %
Lymphs Abs: 1.8 10*3/uL (ref 0.7–4.0)
MCH: 34.6 pg — ABNORMAL HIGH (ref 26.0–34.0)
MCHC: 36.4 g/dL — ABNORMAL HIGH (ref 30.0–36.0)
MCV: 94.8 fL (ref 80.0–100.0)
Monocytes Absolute: 0.5 10*3/uL (ref 0.1–1.0)
Monocytes Relative: 6 %
Neutro Abs: 5.1 10*3/uL (ref 1.7–7.7)
Neutrophils Relative %: 67 %
Platelet Count: 108 10*3/uL — ABNORMAL LOW (ref 150–400)
RBC: 4.63 MIL/uL (ref 4.22–5.81)
RDW: 12.9 % (ref 11.5–15.5)
WBC Count: 7.6 10*3/uL (ref 4.0–10.5)
nRBC: 0 % (ref 0.0–0.2)

## 2021-11-08 LAB — CMP (CANCER CENTER ONLY)
ALT: 26 U/L (ref 0–44)
AST: 20 U/L (ref 15–41)
Albumin: 4.4 g/dL (ref 3.5–5.0)
Alkaline Phosphatase: 50 U/L (ref 38–126)
Anion gap: 3 — ABNORMAL LOW (ref 5–15)
BUN: 11 mg/dL (ref 8–23)
CO2: 29 mmol/L (ref 22–32)
Calcium: 9 mg/dL (ref 8.9–10.3)
Chloride: 107 mmol/L (ref 98–111)
Creatinine: 0.91 mg/dL (ref 0.61–1.24)
GFR, Estimated: >60 mL/min (ref >60)
Glucose, Bld: 136 mg/dL — ABNORMAL HIGH (ref 70–99)
Potassium: 4 mmol/L (ref 3.5–5.1)
Sodium: 139 mmol/L (ref 135–145)
Total Bilirubin: 0.8 mg/dL (ref 0.3–1.2)
Total Protein: 7.2 g/dL (ref 6.5–8.1)

## 2021-11-08 LAB — CEA (IN HOUSE-CHCC): CEA (CHCC-In House): 4.28 ng/mL (ref 0.00–5.00)

## 2021-11-08 NOTE — Progress Notes (Signed)
[place]   [phone] [phone]   Clinic Follow up Note   Patient Care Team: [place], [place] as PCP - General Minus Breeding, MD as PCP - Cardiology (Cardiology) Michael Boston, MD as Consulting Physician (General Surgery) Minus Breeding, MD as Consulting Physician (Cardiology) Loletha Carrow Kirke Corin, MD as Consulting Physician (Gastroenterology) Truitt Merle, MD as Consulting Physician (Oncology)  Date of Service:  11/08/2021  CHIEF COMPLAINT: f/u of colon cancer  CURRENT THERAPY:  Surveillance  ASSESSMENT & PLAN:  [name] [name] is a 71 y.o. male with   1. Left side colon cancer, pT3N0M0, moderately differentiated, stage IIA -Diagnosed 05/2019, s/p surgical resection by Dr Johney Maine on 08/01/19. Path showed T3 disease, 0/31+ nodes, negative margins. There were no high risk features, adjuvant chemo was not recommended  -Guardant reveal ctDNA has been undetected, last 05/07/21. -surveillance CT CAP on 05/07/21 NED -colonoscopy on 06/30/21 by Dr. Loletha Carrow showed only polyps (path showed tubular adenoma), recall 2026. -he is clinically doing well. He notes some residual pain to the incision site, otherwise no changes or concerns. Labs reviewed, mild thrombocytopenia (plt 108k). Physical exam was unremarkable. There is no clinical concern for recurrence. -we will continue 6 month f/u for 3 more years. Plan for repeat scan before next visit.   2.  COPD, coronary artery disease, status post stent placement, diabetes, hypertension, arthritis -Follow-up with PCP and cardiology -stable     PLAN: -f/u in 6 months, with lab and CT several days before   No problem-specific Assessment & Plan notes found for this encounter.   SUMMARY OF ONCOLOGIC HISTORY: Oncology History Overview Note   Cancer Staging  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[facility]) Staging form: Colon and Rectum, AJCC 8th Edition - Pathologic stage from 08/01/2019: Stage IIA (pT3,  pN0, cM0) - Signed by Truitt Merle, MD on 09/05/2019 Total positive nodes: 0 Histologic grading system: 4 grade system Histologic grade (G): G2 Residual tumor (R): R0 - None     Cancer of left colon ([place]) (Resolved)  05/22/2019 Procedure   Colonoscopy by Dr Loletha Carrow 05/22/19  IMPRESSION - A fullness with overlying mild erythema right peri-anal area. Purulent material found on perianal exam. - The examined portion of the ileum was normal. - Three 3 to 5 mm polyps in the rectum and in the ascending colon, removed with a cold snare. Resected and retrieved. - Diverticulosis in the left colon. - Likely malignant partially obstructing tumor at the splenic flexure. Biopsied. Tattooed. - The examination was otherwise normal on direct and retroflexion views.   05/22/2019 Initial Biopsy   Diagnosis 05/22/19 1. Ascending Colon Polyp, rectal, polyps, 3 - TUBULAR ADENOMA(S) WITHOUT HIGH-GRADE DYSPLASIA OR MALIGNANCY - INFLAMMATORY POLYP(S) 2. Descending Colon Biopsy, colon mass - ADENOCARCINOMA. SEE NOTE   05/31/2019 Imaging   CT CAP W Contrast 05/31/19  IMPRESSION: 1. Apple-core lesion described on recent colonoscopy report is identified in the proximal to mid transverse colon. This lesion measures about 3.8 cm in length and is associated with small lymph nodes in the omentum just inferior to the lesion. 2. 1.6 cm ill-defined low-density lesion in the medial segment left liver, highly suspicious for metastatic disease. MRI abdomen with and without contrast would likely prove helpful to further evaluate. 3. Upper normal hepatoduodenal ligament lymph nodes. Attention on follow-up imaging recommended. 4. Tiny bilateral pulmonary nodules measuring up to 4 mm. Unlikely to be metastatic, but attention on follow-up recommended. 5. Tiny hiatal hernia. 6. Aortic Atherosclerosis (ICD10-I70.0).  06/03/2019 Initial Diagnosis   Cancer of left colon ([place])   06/05/2019 Imaging   MRI abdomen  IMPRESSION: 1.  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 No definite signs of metastatic disease to the liver. 2. Probable iron deposition in the spleen. 3. Aortic atherosclerosis.     08/01/2019 Surgery   Surgery by Dr Johney Maine 08/01/19  XI ROBOTIC EXTENDED RIGHT COLECTOMY, OMENTALPEXY, BILATERAL TAP BLOCKS; ASSESSMENT OF TISSUE USING FIREFLY FLUORESCENCE; ANAL FISTULA REPAIR, HEMORRHOID LIGATION AND PEXY    08/01/2019 Pathology Results   FINAL MICROSCOPIC DIAGNOSIS: 08/01/19 A. COLON, PROXIMAL RIGHT COLON TO SPLENIC FLEXURE, RESECTION:  -  Adenocarcinoma, moderately differentiated, 4.0 cm  -  No carcinoma identified in thirty-one lymph nodes (0/31)  -  Margins uninvolved by carcinoma  -  See oncology table and comment below   B. SKIN, RIGHT ANTERIOR PERIRECTAL TRACT, EXCISION:  -  Squamous mucosa with underlying chronic inflammation and granulation  tissue  -  No malignancy identified   C. SKIN, LEFT POSTERIOR HEMORRHOID, EXCISION:  -  Squamous mucosa with dilated submucosal vessels consistent with  hemorrhoid(s)  -  No malignancy identified    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([place])  08/01/2019 Initial Diagnosis   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([place])   08/01/2019 Cancer Staging   Staging form: Colon and Rectum, AJCC 8th Edition - Pathologic stage from 08/01/2019: Stage IIA (pT3, pN0, cM0) - Signed by Truitt Merle, MD on 09/05/2019   05/07/2021 Imaging   EXAM: CT CHEST, ABDOMEN, AND PELVIS WITH CONTRAST  IMPRESSION: No findings to suggest locally recurrent or metastatic disease.   Status post right hemicolectomy.      INTERVAL HISTORY:  [name] [name] is here for a follow up of colon cancer. He was last seen by me on 05/10/21. He presents to the clinic alone. He reports he is doing well overall. He notes he has a pain to his left side that is likely related to surgery. He explains the pain will happen at least once a  week. He denies constipation. He endorses playing golf twice a week.   All other systems were reviewed with the patient and are negative.  MEDICAL HISTORY:  Past Medical History:  Diagnosis Date   Arthritis    Colon cancer ([place])    COPD (chronic obstructive pulmonary disease) (HCC)    mild per pt.   Coronary artery disease    cardiac catheterization on January 26,2010, right coronary arter distal occlusion with stenting of this with xience drug-eluting stent.  Pt also had liminal irregularities  in the LAD and 40-50% mid circumflex  stenosis   Diabetes mellitus without complication (HCC)    Dyspnea    exersion only   GERD (gastroesophageal reflux disease)    History of colon polyps    History of kidney stones    Hyperlipidemia    Hypertension    Myocardial infarction [facility]) 2010   Perirectal abscess    Pneumonia 2013   Stomach ulcer 1970's    SURGICAL HISTORY: Past Surgical History:  Procedure Laterality Date   COLONOSCOPY     CORONARY ANGIOPLASTY WITH STENT PLACEMENT  2010   POLYPECTOMY      I have reviewed the social history and family history with the patient and they are unchanged from previous note.  ALLERGIES:  is allergic to clopidogrel bisulfate.  MEDICATIONS:  Current Outpatient Medications  Medication Sig Dispense Refill   aspirin EC 81  MG tablet Take 81 mg by mouth every evening.      b complex vitamins tablet Take 1 tablet by mouth every evening.      cyclobenzaprine (FLEXERIL) 10 MG tablet Take 10 mg by mouth at bedtime. (Patient not taking: Reported on 06/16/2021)     lisinopril (PRINIVIL,ZESTRIL) 10 MG tablet TAKE 1 TABLET BY MOUTH EVERY DAY (Patient taking differently: Take 10 mg by mouth every evening. TAKE 1 TABLET BY MOUTH EVERY DAY) 30 tablet 3   meloxicam (MOBIC) 15 MG tablet Take 15 mg by mouth daily.     metFORMIN (GLUCOPHAGE) 500 MG tablet Take 500 mg by mouth every evening.      naproxen sodium (ALEVE) 220 MG tablet Take 220-440 mg by mouth 2  (two) times daily as needed (pain.). (Patient not taking: Reported on 06/16/2021)     nitroGLYCERIN (NITROSTAT) 0.4 MG SL tablet Place 1 tablet (0.4 mg total) under the tongue every 5 (five) minutes as needed. 25 tablet 2   ONETOUCH ULTRA test strip 2 (two) times daily.     pantoprazole (PROTONIX) 40 MG tablet Take 40 mg by mouth every other day. In the evening.     simvastatin (ZOCOR) 40 MG tablet TAKE 1 TABLET (40 MG TOTAL) BY MOUTH AT BEDTIME. (Patient taking differently: Take 40 mg by mouth every evening.) 30 tablet 6   vitamin B-12 (CYANOCOBALAMIN) 1000 MCG tablet Take 1,000 mcg by mouth every evening.     Current Facility-Administered Medications  Medication Dose Route Frequency Provider Last Rate Last Admin   0.9 %  sodium chloride infusion  500 mL Intravenous Once Danis, Estill Cotta III, MD        PHYSICAL EXAMINATION: ECOG PERFORMANCE STATUS: 1 - Symptomatic but completely ambulatory  Vitals:   11/08/21 1203  BP: (!) 156/69  Pulse: 66  Resp: 16  Temp: 97.7 F (36.5 C)  SpO2: 96%   Wt Readings from Last 3 Encounters:  11/08/21 201 lb 1.6 oz (91.2 kg)  06/30/21 200 lb (90.7 kg)  06/16/21 200 lb (90.7 kg)     GENERAL:alert, no distress and comfortable SKIN: skin color, texture, turgor are normal, no rashes or significant lesions EYES: normal, Conjunctiva are pink and non-injected, sclera clear  LUNGS: clear to auscultation and percussion with normal breathing effort HEART: regular rate & rhythm and no murmurs and no lower extremity edema ABDOMEN:abdomen soft, non-tender and normal bowel sounds Musculoskeletal:no cyanosis of digits and no clubbing  NEURO: alert & oriented x 3 with fluent speech, no focal motor/sensory deficits  LABORATORY DATA:  I have reviewed the data as listed    Latest Ref Rng & Units 11/08/2021   11:11 AM 05/07/2021    1:19 PM 01/04/2021   10:28 AM  CBC  WBC 4.0 - 10.5 K/uL 7.6  8.4  7.5   Hemoglobin 13.0 - 17.0 g/dL 16.0  16.8  16.2   Hematocrit 39.0  - 52.0 % 43.9  47.6  46.8   Platelets 150 - 400 K/uL 108  135  132         Latest Ref Rng & Units 11/08/2021   11:11 AM 05/07/2021    1:19 PM 01/04/2021   10:28 AM  CMP  Glucose 70 - 99 mg/dL 136  110  114   BUN 8 - 23 mg/dL 11  10  14    Creatinine 0.61 - 1.24 mg/dL 0.91  0.95  0.95   Sodium 135 - 145 mmol/L 139  140  142  Potassium 3.5 - 5.1 mmol/L 4.0  4.0  4.3   Chloride 98 - 111 mmol/L 107  105  106   CO2 22 - 32 mmol/L 29  27  26    Calcium 8.9 - 10.3 mg/dL 9.0  9.6  9.4   Total Protein 6.5 - 8.1 g/dL 7.2  7.8  7.3   Total Bilirubin 0.3 - 1.2 mg/dL 0.8  0.8  0.8   Alkaline Phos 38 - 126 U/L 50  58  64   AST 15 - 41 U/L 20  18  20    ALT 0 - 44 U/L 26  21  27        RADIOGRAPHIC STUDIES: I have personally reviewed the radiological images as listed and agreed with the findings in the report. No results found.    Orders Placed This Encounter  Procedures   CT CHEST ABDOMEN PELVIS W CONTRAST    Standing Status:   Future    Standing Expiration Date:   11/09/2022    Order Specific Question:   Preferred imaging location?    Answer:   [facility]    Order Specific Question:   Is Oral Contrast requested for this exam?    Answer:   Yes, Per Radiology protocol   All questions were answered. The patient knows to call the clinic with any problems, questions or concerns. No barriers to learning was detected. The total time spent in the appointment was 25 minutes.     Truitt Merle, MD 11/08/2021   I, Wilburn Mylar, am acting as scribe for Truitt Merle, MD.   I have reviewed the above documentation for accuracy and completeness, and I agree with the above.

## 2022-01-24 ENCOUNTER — Other Ambulatory Visit: Payer: Self-pay

## 2022-01-24 ENCOUNTER — Telehealth: Payer: Self-pay

## 2022-01-24 DIAGNOSIS — C184 Malignant neoplasm of transverse colon: Secondary | ICD-10-CM

## 2022-01-24 DIAGNOSIS — C186 Malignant neoplasm of descending colon: Secondary | ICD-10-CM

## 2022-01-24 NOTE — Telephone Encounter (Signed)
Spoke with pt's spouse Jenny Reichmann to inform she and the pt that Dr. Burr Medico has ordered for the pt to get Guardant Reveal on 02/04/2022 @0915 .  Jenny Reichmann stated that she will let the pt know and confirmed the pt's lab appointment.

## 2022-01-29 NOTE — Progress Notes (Unsigned)
Cardiology Office Note   Date:  01/31/2022   ID:  [name] [name], DOB 06-Apr-1950, MRN [id number]  PCP:  [place], [place]  Cardiologist:   Minus Breeding, MD   Chief Complaint  Patient presents with   Chest Pain       History of Present Illness: [name] [name] is a 71 y.o. male who presents for follow-up of CAD.  He had a negative POET (Plain Old Exercise Treadmill) in 2017.   He presents for follow up.   He has had coronary disease as described below.  Since I last saw him he has done well.  He still golfing.  He has some back discomfort but he is getting some relief using a hemp salve.  The patient denies any new symptoms such as chest discomfort, neck or arm discomfort. There has been no new shortness of breath, PND or orthopnea. There have been no reported palpitations, presyncope or syncope.   Past Medical History:  Diagnosis Date   Arthritis    Colon cancer ([place])    COPD (chronic obstructive pulmonary disease) (HCC)    mild per pt.   Coronary artery disease    cardiac catheterization on January 26,2010, right coronary arter distal occlusion with stenting of this with xience drug-eluting stent.  Pt also had liminal irregularities  in the LAD and 40-50% mid circumflex  stenosis   Diabetes mellitus without complication (HCC)    Dyspnea    exersion only   GERD (gastroesophageal reflux disease)    History of colon polyps    History of kidney stones    Hyperlipidemia    Hypertension    Myocardial infarction [facility]) 2010   Perirectal abscess    Pneumonia 2013   Stomach ulcer 1970's    Past Surgical History:  Procedure Laterality Date   COLONOSCOPY     CORONARY ANGIOPLASTY WITH STENT PLACEMENT  2010   POLYPECTOMY       Current Outpatient Medications  Medication Sig Dispense Refill   aspirin EC 81 MG tablet Take 81 mg by mouth every evening.      b complex vitamins tablet Take 1 tablet by mouth every evening.      lisinopril  (PRINIVIL,ZESTRIL) 10 MG tablet TAKE 1 TABLET BY MOUTH EVERY DAY (Patient taking differently: Take 10 mg by mouth every evening. TAKE 1 TABLET BY MOUTH EVERY DAY) 30 tablet 3   meloxicam (MOBIC) 15 MG tablet Take 15 mg by mouth daily.     metFORMIN (GLUCOPHAGE) 500 MG tablet Take 500 mg by mouth every evening.      naproxen sodium (ALEVE) 220 MG tablet Take 220-440 mg by mouth 2 (two) times daily as needed (pain.).     nitroGLYCERIN (NITROSTAT) 0.4 MG SL tablet Place 1 tablet (0.4 mg total) under the tongue every 5 (five) minutes as needed. 25 tablet 2   ONETOUCH ULTRA test strip 2 (two) times daily.     pantoprazole (PROTONIX) 40 MG tablet Take 40 mg by mouth every other day. In the evening.     simvastatin (ZOCOR) 40 MG tablet TAKE 1 TABLET (40 MG TOTAL) BY MOUTH AT BEDTIME. (Patient taking differently: Take 40 mg by mouth every evening.) 30 tablet 6   vitamin B-12 (CYANOCOBALAMIN) 1000 MCG tablet Take 1,000 mcg by mouth every evening.     cyclobenzaprine (FLEXERIL) 10 MG tablet Take 10 mg by mouth at bedtime. (Patient not taking: Reported on 06/16/2021)     lisinopril (ZESTRIL)  20 MG tablet Take 20 mg by mouth daily.     Current Facility-Administered Medications  Medication Dose Route Frequency Provider Last Rate Last Admin   0.9 %  sodium chloride infusion  500 mL Intravenous Once Nelida Meuse III, MD        Allergies:   Clopidogrel bisulfate    ROS:  Please see the history of present illness.   Otherwise, review of systems are positive for none.   As stated in the HPI and negative for all other systems..   All other systems are reviewed and negative.    PHYSICAL EXAM: VS:  BP (!) 128/92 (BP Location: Left Arm, Patient Position: Sitting, Cuff Size: Large)   Pulse 65   Ht 5' 4"  (1.626 m)   Wt 202 lb 12.8 oz (92 kg)   SpO2 94%   BMI 34.81 kg/m  , BMI Body mass index is 34.81 kg/m.  GENERAL:  Well appearing NECK:  No jugular venous distention, waveform within normal limits, carotid  upstroke brisk and symmetric, no bruits, no thyromegaly LUNGS:  Clear to auscultation bilaterally CHEST:  Unremarkable HEART:  PMI not displaced or sustained,S1 and S2 within normal limits, no S3, no S4, no clicks, no rubs, no murmurs ABD:  Flat, positive bowel sounds normal in frequency in pitch, no bruits, no rebound, no guarding, no midline pulsatile mass, no hepatomegaly, no splenomegaly EXT:  2 plus pulses throughout, no edema, no cyanosis no clubbing  EKG:  EKG is  ordered today. The ekg ordered demonstrates sinus rhythm, rate 65, possible old inferior infarct, no acute ST-T wave changes.  PVCs   Recent Labs: 11/08/2021: ALT 26; BUN 11; Creatinine 0.91; Hemoglobin 16.0; Platelet Count 108; Potassium 4.0; Sodium 139    Lipid Panel   Wt Readings from Last 3 Encounters:  01/31/22 202 lb 12.8 oz (92 kg)  11/08/21 201 lb 1.6 oz (91.2 kg)  06/30/21 200 lb (90.7 kg)      Other studies Reviewed: Additional studies/ records that were reviewed today include: None Review of the above records demonstrates:    NA  ASSESSMENT AND PLAN:   HYPERTENSION, UNSPECIFIED -  The blood pressure is mildly elevated.  He is making a blood pressure diary.  He was told by his primary provider to increase lisinopril which he just started yesterday and I agree with this.   CAD, UNSPECIFIED SITE -  The patient has no new sypmtoms.  No further cardiovascular testing is indicated.  We will continue with aggressive risk reduction and meds as listed.he has had no new symptoms and stress testing in 2017.  TOBACCO ABUSE -  We again talked about the need to stop smoking that he said possibly be a consideration but he said that last year.  DYSLIPIDEMIA - LDL was good last year but he said he has had this repeated in the last couple of days hopefully and will get me the results.   DM - A1C was 6.1 but again this was last year's data.   Current medicines are reviewed at length with the patient today.  The  patient does not have concerns regarding medicines.  The following changes have been made:  None  Labs/ tests ordered today include: None  Orders Placed This Encounter  Procedures   EKG 12-Lead      Disposition:   FU with me in one year.  Ronnell Guadalajara, MD  01/31/2022 10:09 AM    [facility]

## 2022-01-31 ENCOUNTER — Encounter: Payer: Self-pay | Admitting: Cardiology

## 2022-01-31 ENCOUNTER — Ambulatory Visit: Payer: Medicare Other | Attending: Cardiology | Admitting: Cardiology

## 2022-01-31 VITALS — BP 128/92 | HR 65 | Ht 64.0 in | Wt 202.8 lb

## 2022-01-31 DIAGNOSIS — E785 Hyperlipidemia, unspecified: Secondary | ICD-10-CM | POA: Diagnosis not present

## 2022-01-31 DIAGNOSIS — E118 Type 2 diabetes mellitus with unspecified complications: Secondary | ICD-10-CM | POA: Insufficient documentation

## 2022-01-31 DIAGNOSIS — I2510 Atherosclerotic heart disease of native coronary artery without angina pectoris: Secondary | ICD-10-CM | POA: Insufficient documentation

## 2022-01-31 DIAGNOSIS — Z720 Tobacco use: Secondary | ICD-10-CM | POA: Diagnosis not present

## 2022-01-31 DIAGNOSIS — I10 Essential (primary) hypertension: Secondary | ICD-10-CM | POA: Diagnosis not present

## 2022-01-31 NOTE — Patient Instructions (Signed)
Medication Instructions:  Your physician recommends that you continue on your current medications as directed. Please refer to the Current Medication list given to you today.  *If you need a refill on your cardiac medications before your next appointment, please call your pharmacy*   Lab Work: NONE If you have labs (blood work) drawn today and your tests are completely normal, you will receive your results only by: [place] (if you have MyChart) OR A paper copy in the mail If you have any lab test that is abnormal or we need to change your treatment, we will call you to review the results.   Testing/Procedures: NONE   Follow-Up: At [facility], you and your health needs are our priority.  As part of our continuing mission to provide you with exceptional heart care, we have created designated Provider Care Teams.  These Care Teams include your primary Cardiologist (physician) and Advanced Practice Providers (APPs -  Physician Assistants and Nurse Practitioners) who all work together to provide you with the care you need, when you need it.  We recommend signing up for the patient portal called "MyChart".  Sign up information is provided on this After Visit Summary.  MyChart is used to connect with patients for Virtual Visits (Telemedicine).  Patients are able to view lab/test results, encounter notes, upcoming appointments, etc.  Non-urgent messages can be sent to your provider as well.   To learn more about what you can do with MyChart, go to [email or web address].    Your next appointment:   1 year(s)  The format for your next appointment:   In Person  Provider:   Minus Breeding, MD

## 2022-02-04 ENCOUNTER — Inpatient Hospital Stay: Payer: Medicare Other | Attending: Hematology

## 2022-02-04 DIAGNOSIS — C184 Malignant neoplasm of transverse colon: Secondary | ICD-10-CM

## 2022-02-04 DIAGNOSIS — C186 Malignant neoplasm of descending colon: Secondary | ICD-10-CM

## 2022-02-16 ENCOUNTER — Encounter: Payer: Self-pay | Admitting: Hematology

## 2022-02-16 ENCOUNTER — Telehealth: Payer: Self-pay

## 2022-02-16 ENCOUNTER — Other Ambulatory Visit: Payer: Self-pay

## 2022-02-16 NOTE — Telephone Encounter (Signed)
Spoke with pt via telephone to inform pt that his [place] is negative.  Pt was glad to hear great news and had no further questions at this time.

## 2022-02-18 LAB — GUARDANT 360

## 2022-05-09 ENCOUNTER — Ambulatory Visit (HOSPITAL_COMMUNITY)
Admission: RE | Admit: 2022-05-09 | Discharge: 2022-05-09 | Disposition: A | Discharge status: Home / Self Care | Payer: Medicare Other | Source: Ambulatory Visit | Attending: Hematology | Admitting: Hematology

## 2022-05-09 ENCOUNTER — Other Ambulatory Visit: Payer: Self-pay

## 2022-05-09 ENCOUNTER — Inpatient Hospital Stay: Payer: Medicare Other | Attending: Hematology

## 2022-05-09 DIAGNOSIS — C186 Malignant neoplasm of descending colon: Secondary | ICD-10-CM

## 2022-05-09 DIAGNOSIS — C184 Malignant neoplasm of transverse colon: Secondary | ICD-10-CM

## 2022-05-09 DIAGNOSIS — Z79899 Other long term (current) drug therapy: Secondary | ICD-10-CM | POA: Insufficient documentation

## 2022-05-09 DIAGNOSIS — Z85038 Personal history of other malignant neoplasm of large intestine: Secondary | ICD-10-CM | POA: Insufficient documentation

## 2022-05-09 LAB — CMP (CANCER CENTER ONLY)
ALT: 21 U/L (ref 0–44)
AST: 17 U/L (ref 15–41)
Albumin: 4.2 g/dL (ref 3.5–5.0)
Alkaline Phosphatase: 58 U/L (ref 38–126)
Anion gap: 6 (ref 5–15)
BUN: 12 mg/dL (ref 8–23)
CO2: 29 mmol/L (ref 22–32)
Calcium: 9.5 mg/dL (ref 8.9–10.3)
Chloride: 104 mmol/L (ref 98–111)
Creatinine: 1.01 mg/dL (ref 0.61–1.24)
GFR, Estimated: >60 mL/min (ref >60)
Glucose, Bld: 128 mg/dL — ABNORMAL HIGH (ref 70–99)
Potassium: 4.3 mmol/L (ref 3.5–5.1)
Sodium: 139 mmol/L (ref 135–145)
Total Bilirubin: 0.8 mg/dL (ref 0.3–1.2)
Total Protein: 6.9 g/dL (ref 6.5–8.1)

## 2022-05-09 LAB — CBC WITH DIFFERENTIAL (CANCER CENTER ONLY)
Abs Immature Granulocytes: 0.01 10*3/uL (ref 0.00–0.07)
Basophils Absolute: 0.1 10*3/uL (ref 0.0–0.1)
Basophils Relative: 1 %
Eosinophils Absolute: 0.1 10*3/uL (ref 0.0–0.5)
Eosinophils Relative: 2 %
HCT: 47.1 % (ref 39.0–52.0)
Hemoglobin: 16.9 g/dL (ref 13.0–17.0)
Immature Granulocytes: 0 %
Lymphocytes Relative: 23 %
Lymphs Abs: 1.8 10*3/uL (ref 0.7–4.0)
MCH: 34.2 pg — ABNORMAL HIGH (ref 26.0–34.0)
MCHC: 35.9 g/dL (ref 30.0–36.0)
MCV: 95.3 fL (ref 80.0–100.0)
Monocytes Absolute: 0.5 10*3/uL (ref 0.1–1.0)
Monocytes Relative: 7 %
Neutro Abs: 5.2 10*3/uL (ref 1.7–7.7)
Neutrophils Relative %: 67 %
Platelet Count: 119 10*3/uL — ABNORMAL LOW (ref 150–400)
RBC: 4.94 MIL/uL (ref 4.22–5.81)
RDW: 12.4 % (ref 11.5–15.5)
WBC Count: 7.7 10*3/uL (ref 4.0–10.5)
nRBC: 0 % (ref 0.0–0.2)

## 2022-05-09 MED ORDER — IOHEXOL 300 MG/ML  SOLN
100.0000 mL | Freq: Once | INTRAMUSCULAR | Status: AC | PRN
  Administered 2022-05-09: 100 mL via INTRAVENOUS

## 2022-05-12 NOTE — Assessment & Plan Note (Addendum)
pT3N0M0, moderately differentiated, stage IIA -Diagnosed 05/2019, s/p surgical resection by Dr Johney Maine on 08/01/19. Path showed T3 disease, 0/31+ nodes, negative margins. There were no high risk features, adjuvant chemo was not recommended  -Guardant reveal ctDNA has been undetected, last 05/07/21. -surveillance CT CAP on 05/07/21 NED -colonoscopy on 06/30/21 by Dr. Loletha Carrow showed only polyps (path showed tubular adenoma), recall 2026. -he is clinically doing well.  -Surveillance CT scan from May 09, 2022 showed no evidence of recurrence.  I discussed with patient, including the incidental findings. -It has been 3 years since his initial diagnosis, his risk of recurrence is much lower now.  I do not plan to repeat routine surveillance CT scan, will continue monitor clinically every 6 to 12 months for additional 2 years.

## 2022-05-12 NOTE — Progress Notes (Signed)
[place]   Telephone:([phone]) [phone] Fax:(336) [phone]   Clinic Follow up Note   Patient Care Team: Wilson, Bussey as PCP - General Minus Breeding, MD as PCP - Cardiology (Cardiology) Michael Boston, MD as Consulting Physician (General Surgery) Minus Breeding, MD as Consulting Physician (Cardiology) Loletha Carrow Kirke Corin, MD as Consulting Physician (Gastroenterology) Truitt Merle, MD as Consulting Physician (Oncology)  Date of Service:  05/13/2022  CHIEF COMPLAINT: f/u of colon cancer   CURRENT THERAPY:  Surveillance   ASSESSMENT:  [name] [name] is a 72 y.o. 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 Path showed T3 disease, 0/31+ nodes, negative margins. There were no high risk features, adjuvant chemo was not recommended  -Guardant reveal ctDNA has been undetected, last 05/07/21. -surveillance CT CAP on 05/07/21 NED -colonoscopy on 06/30/21 by Dr. Loletha Carrow showed only polyps (path showed tubular adenoma), recall 2026. -he is clinically doing well.  -Surveillance CT scan from May 09, 2022 showed no evidence of recurrence.  I discussed with patient, including the incidental findings. -It has been 3 years since his initial diagnosis, his risk of recurrence is much lower now.  I do not plan to repeat routine surveillance CT scan, will continue monitor clinically every 6 to 12 months for additional 2 years.     PLAN: -Discuss CT scan, NED -lab and F/u in 6 months    SUMMARY OF ONCOLOGIC HISTORY: Oncology History Overview Note   Cancer Staging  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[facility]) Staging form: Colon and Rectum, AJCC 8th Edition - Pathologic stage from 08/01/2019: Stage IIA (pT3, pN0, cM0) - Signed by Truitt Merle, MD on 09/05/2019 Total positive nodes: 0 Histologic grading system: 4 grade system Histologic grade (G): G2 Residual  tumor (R): R0 - None     Cancer of left colon ([place]) (Resolved)  05/22/2019 Procedure   Colonoscopy by Dr Loletha Carrow 05/22/19  IMPRESSION - A fullness with overlying mild erythema right peri-anal area. Purulent material found on perianal exam. - The examined portion of the ileum was normal. - Three 3 to 5 mm polyps in the rectum and in the ascending colon, removed with a cold snare. Resected and retrieved. - Diverticulosis in the left colon. - Likely malignant partially obstructing tumor at the splenic flexure. Biopsied. Tattooed. - The examination was otherwise normal on direct and retroflexion views.   05/22/2019 Initial Biopsy   Diagnosis 05/22/19 1. Ascending Colon Polyp, rectal, polyps, 3 - TUBULAR ADENOMA(S) WITHOUT HIGH-GRADE DYSPLASIA OR MALIGNANCY - INFLAMMATORY POLYP(S) 2. Descending Colon Biopsy, colon mass - ADENOCARCINOMA. SEE NOTE   05/31/2019 Imaging   CT CAP W Contrast 05/31/19  IMPRESSION: 1. Apple-core lesion described on recent colonoscopy report is identified in the proximal to mid transverse colon. This lesion measures about 3.8 cm in length and is associated with small lymph nodes in the omentum just inferior to the lesion. 2. 1.6 cm ill-defined low-density lesion in the medial segment left liver, highly suspicious for metastatic disease. MRI abdomen with and without contrast would likely prove helpful to further evaluate. 3. Upper normal hepatoduodenal ligament lymph nodes. Attention on follow-up imaging recommended. 4. Tiny bilateral pulmonary nodules measuring up to 4 mm. Unlikely to be metastatic, but attention on follow-up recommended. 5. Tiny hiatal hernia. 6. Aortic Atherosclerosis (ICD10-I70.0).   06/03/2019 Initial Diagnosis   Cancer of left colon ([place])   06/05/2019 Imaging  MRI abdomen  IMPRESSION: 1. 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 No definite signs of  metastatic disease to the liver. 2. Probable iron deposition in the spleen. 3. Aortic atherosclerosis.     08/01/2019 Surgery   Surgery by Dr Johney Maine 08/01/19  XI ROBOTIC EXTENDED RIGHT COLECTOMY, OMENTALPEXY, BILATERAL TAP BLOCKS; ASSESSMENT OF TISSUE USING FIREFLY FLUORESCENCE; ANAL FISTULA REPAIR, HEMORRHOID LIGATION AND PEXY    08/01/2019 Pathology Results   FINAL MICROSCOPIC DIAGNOSIS: 08/01/19 A. COLON, PROXIMAL RIGHT COLON TO SPLENIC FLEXURE, RESECTION:  -  Adenocarcinoma, moderately differentiated, 4.0 cm  -  No carcinoma identified in thirty-one lymph nodes (0/31)  -  Margins uninvolved by carcinoma  -  See oncology table and comment below   B. SKIN, RIGHT ANTERIOR PERIRECTAL TRACT, EXCISION:  -  Squamous mucosa with underlying chronic inflammation and granulation  tissue  -  No malignancy identified   C. SKIN, LEFT POSTERIOR HEMORRHOID, EXCISION:  -  Squamous mucosa with dilated submucosal vessels consistent with  hemorrhoid(s)  -  No malignancy identified    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([place])  08/01/2019 Initial Diagnosis   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([place])   08/01/2019 Cancer Staging   Staging form: Colon and Rectum, AJCC 8th Edition - Pathologic stage from 08/01/2019: Stage IIA (pT3, pN0, cM0) - Signed by Truitt Merle, MD on 09/05/2019   05/07/2021 Imaging   EXAM: CT CHEST, ABDOMEN, AND PELVIS WITH CONTRAST  IMPRESSION: No findings to suggest locally recurrent or metastatic disease.   Status post right hemicolectomy.      INTERVAL HISTORY:  [name] [name] is here for a follow up of colon cancer  He was last seen by me on 11/08/2021 He presents to the clinic alone. Pt reports that he is doing good. Pt reports of having the same pain in his side and it everyday. Pt denies having constipation.    All other systems were reviewed with the patient and are negative.  MEDICAL HISTORY:  Past Medical History:  Diagnosis Date   Arthritis    Colon cancer ([place])    COPD  (chronic obstructive pulmonary disease) (HCC)    mild per pt.   Coronary artery disease    cardiac catheterization on January 26,2010, right coronary arter distal occlusion with stenting of this with xience drug-eluting stent.  Pt also had liminal irregularities  in the LAD and 40-50% mid circumflex  stenosis   Diabetes mellitus without complication (HCC)    Dyspnea    exersion only   GERD (gastroesophageal reflux disease)    History of colon polyps    History of kidney stones    Hyperlipidemia    Hypertension    Myocardial infarction [facility]) 2010   Perirectal abscess    Pneumonia 2013   Stomach ulcer 1970's    SURGICAL HISTORY: Past Surgical History:  Procedure Laterality Date   COLONOSCOPY     CORONARY ANGIOPLASTY WITH STENT PLACEMENT  2010   POLYPECTOMY      I have reviewed the social history and family history with the patient and they are unchanged from previous note.  ALLERGIES:  is allergic to clopidogrel bisulfate.  MEDICATIONS:  Current Outpatient Medications  Medication Sig Dispense Refill   aspirin EC 81 MG tablet Take 81 mg by mouth every evening.      b complex vitamins tablet Take 1 tablet by mouth every evening.      cyclobenzaprine (FLEXERIL) 10 MG  tablet Take 10 mg by mouth at bedtime. (Patient not taking: Reported on 06/16/2021)     lisinopril (ZESTRIL) 20 MG tablet Take 20 mg by mouth daily.     meloxicam (MOBIC) 15 MG tablet Take 15 mg by mouth daily.     metFORMIN (GLUCOPHAGE) 500 MG tablet Take 500 mg by mouth every evening.      naproxen sodium (ALEVE) 220 MG tablet Take 220-440 mg by mouth 2 (two) times daily as needed (pain.).     nitroGLYCERIN (NITROSTAT) 0.4 MG SL tablet Place 1 tablet (0.4 mg total) under the tongue every 5 (five) minutes as needed. 25 tablet 2   ONETOUCH ULTRA test strip 2 (two) times daily.     pantoprazole (PROTONIX) 40 MG tablet Take 40 mg by mouth every other day. In the evening.     simvastatin (ZOCOR) 40 MG tablet TAKE 1  TABLET (40 MG TOTAL) BY MOUTH AT BEDTIME. (Patient taking differently: Take 40 mg by mouth every evening.) 30 tablet 6   vitamin B-12 (CYANOCOBALAMIN) 1000 MCG tablet Take 1,000 mcg by mouth every evening.     Current Facility-Administered Medications  Medication Dose Route Frequency Provider Last Rate Last Admin   0.9 %  sodium chloride infusion  500 mL Intravenous Once Nelida Meuse III, MD        PHYSICAL EXAMINATION: ECOG PERFORMANCE STATUS: 0 - Asymptomatic  Vitals:   05/13/22 0854  BP: (!) 147/72  Pulse: 68  Resp: 13  Temp: 97.9 F (36.6 C)  SpO2: 100%   Wt Readings from Last 3 Encounters:  05/13/22 201 lb 6.4 oz (91.4 kg)  01/31/22 202 lb 12.8 oz (92 kg)  11/08/21 201 lb 1.6 oz (91.2 kg)     GENERAL:alert, no distress and comfortable SKIN: skin color normal, no rashes or significant lesions EYES: normal, Conjunctiva are pink and non-injected, sclera clear  NEURO: alert & oriented x 3 with fluent speech  LABORATORY DATA:  I have reviewed the data as listed    Latest Ref Rng & Units 05/09/2022    8:47 AM 11/08/2021   11:11 AM 05/07/2021    1:19 PM  CBC  WBC 4.0 - 10.5 K/uL 7.7  7.6  8.4   Hemoglobin 13.0 - 17.0 g/dL 16.9  16.0  16.8   Hematocrit 39.0 - 52.0 % 47.1  43.9  47.6   Platelets 150 - 400 K/uL 119  108  135         Latest Ref Rng & Units 05/09/2022    8:47 AM 11/08/2021   11:11 AM 05/07/2021    1:19 PM  CMP  Glucose 70 - 99 mg/dL 128  136  110   BUN 8 - 23 mg/dL 12  11  10   $ Creatinine 0.61 - 1.24 mg/dL 1.01  0.91  0.95   Sodium 135 - 145 mmol/L 139  139  140   Potassium 3.5 - 5.1 mmol/L 4.3  4.0  4.0   Chloride 98 - 111 mmol/L 104  107  105   CO2 22 - 32 mmol/L 29  29  27   $ Calcium 8.9 - 10.3 mg/dL 9.5  9.0  9.6   Total Protein 6.5 - 8.1 g/dL 6.9  7.2  7.8   Total Bilirubin 0.3 - 1.2 mg/dL 0.8  0.8  0.8   Alkaline Phos 38 - 126 U/L 58  50  58   AST 15 - 41 U/L 17  20  18   $ ALT 0 -  44 U/L 21  26  21       $ RADIOGRAPHIC STUDIES: I have personally  reviewed the radiological images as listed and agreed with the findings in the report. No results found.    No orders of the defined types were placed in this encounter.  All questions were answered. The patient knows to call the clinic with any problems, questions or concerns. No barriers to learning was detected. The total time spent in the appointment was 25 minutes.     Truitt Merle, MD 05/13/2022   Felicity Coyer, CMA, am acting as scribe for Truitt Merle, MD.   I have reviewed the above documentation for accuracy and completeness, and I agree with the above.

## 2022-05-13 ENCOUNTER — Inpatient Hospital Stay (HOSPITAL_BASED_OUTPATIENT_CLINIC_OR_DEPARTMENT_OTHER): Payer: Medicare Other | Admitting: Hematology

## 2022-05-13 ENCOUNTER — Encounter: Payer: Self-pay | Admitting: Hematology

## 2022-05-13 VITALS — BP 147/72 | HR 68 | Temp 97.9°F | Resp 13 | Wt 201.4 lb

## 2022-05-13 DIAGNOSIS — C184 Malignant neoplasm of transverse colon: Secondary | ICD-10-CM

## 2022-05-13 DIAGNOSIS — Z79899 Other long term (current) drug therapy: Secondary | ICD-10-CM | POA: Diagnosis not present

## 2022-05-13 DIAGNOSIS — Z85038 Personal history of other malignant neoplasm of large intestine: Secondary | ICD-10-CM | POA: Diagnosis present

## 2022-08-03 ENCOUNTER — Other Ambulatory Visit: Payer: Self-pay

## 2022-08-03 ENCOUNTER — Telehealth: Payer: Self-pay | Admitting: Hematology

## 2022-08-03 NOTE — Telephone Encounter (Signed)
Contacted patient to scheduled appointments. Patient is aware of appointments that are scheduled.   

## 2022-08-05 ENCOUNTER — Other Ambulatory Visit: Payer: Medicare Other

## 2022-08-05 ENCOUNTER — Other Ambulatory Visit: Payer: Self-pay

## 2022-08-05 ENCOUNTER — Inpatient Hospital Stay: Payer: Medicare Other | Attending: Hematology

## 2022-08-05 DIAGNOSIS — C184 Malignant neoplasm of transverse colon: Secondary | ICD-10-CM

## 2022-08-05 DIAGNOSIS — C186 Malignant neoplasm of descending colon: Secondary | ICD-10-CM

## 2022-08-19 ENCOUNTER — Other Ambulatory Visit: Payer: Self-pay

## 2022-08-23 ENCOUNTER — Encounter: Payer: Self-pay | Admitting: Hematology

## 2022-08-24 ENCOUNTER — Telehealth: Payer: Self-pay

## 2022-08-24 LAB — GUARDANT REVEAL

## 2022-08-24 NOTE — Telephone Encounter (Signed)
Called patient to give results to his Guardant Reveal which was negative as per Dr. Mosetta Putt. Patient stated he seen it on his my chart told him just wanted to make sure he knew.

## 2022-11-06 ENCOUNTER — Other Ambulatory Visit: Payer: Self-pay | Admitting: Nurse Practitioner

## 2022-11-06 DIAGNOSIS — C184 Malignant neoplasm of transverse colon: Secondary | ICD-10-CM

## 2022-11-06 NOTE — Progress Notes (Unsigned)
Patient Care Team: [facility] as PCP - General Rollene Rotunda, MD as PCP - Cardiology (Cardiology) Karie Soda, MD as Consulting Physician (General Surgery) Rollene Rotunda, MD as Consulting Physician (Cardiology) Myrtie Neither Andreas Blower, MD as Consulting Physician (Gastroenterology) Malachy Mood, MD as Consulting Physician (Oncology)   CHIEF COMPLAINT: Follow up colon cancer   Oncology History Overview Note   Cancer Staging  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[facility]) Staging form: Colon and Rectum, AJCC 8th Edition - Pathologic stage from 08/01/2019: Stage IIA (pT3, pN0, cM0) - Signed by Malachy Mood, MD on 09/05/2019 Total positive nodes: 0 Histologic grading system: 4 grade system Histologic grade (G): G2 Residual tumor (R): R0 - None     Cancer of left colon ([facility]) (Resolved)  05/22/2019 Procedure   Colonoscopy by Dr Myrtie Neither 05/22/19  IMPRESSION - A fullness with overlying mild erythema right peri-anal area. Purulent material found on perianal exam. - The examined portion of the ileum was normal. - Three 3 to 5 mm polyps in the rectum and in the ascending colon, removed with a cold snare. Resected and retrieved. - Diverticulosis in the left colon. - Likely malignant partially obstructing tumor at the splenic flexure. Biopsied. Tattooed. - The examination was otherwise normal on direct and retroflexion views.   05/22/2019 Initial Biopsy   Diagnosis 05/22/19 1. Ascending Colon Polyp, rectal, polyps, 3 - TUBULAR ADENOMA(S) WITHOUT HIGH-GRADE DYSPLASIA OR MALIGNANCY - INFLAMMATORY POLYP(S) 2. Descending Colon Biopsy, colon mass - ADENOCARCINOMA. SEE NOTE   05/31/2019 Imaging   CT CAP W Contrast 05/31/19  IMPRESSION: 1. Apple-core lesion described on recent colonoscopy report is identified in the proximal to mid transverse colon. This lesion measures about 3.8 cm in length and is associated with small lymph nodes in the omentum just inferior to the lesion. 2. 1.6 cm  ill-defined low-density lesion in the medial segment left liver, highly suspicious for metastatic disease. MRI abdomen with and without contrast would likely prove helpful to further evaluate. 3. Upper normal hepatoduodenal ligament lymph nodes. Attention on follow-up imaging recommended. 4. Tiny bilateral pulmonary nodules measuring up to 4 mm. Unlikely to be metastatic, but attention on follow-up recommended. 5. Tiny hiatal hernia. 6. Aortic Atherosclerosis (ICD10-I70.0).   06/03/2019 Initial Diagnosis   Cancer of left colon (HCC)   06/05/2019 Imaging   MRI abdomen  IMPRESSION: 1. 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 No definite signs of metastatic disease to the liver. 2. Probable iron deposition in the spleen. 3. Aortic atherosclerosis.     08/01/2019 Surgery   Surgery by Dr Michaell Cowing 08/01/19  XI ROBOTIC EXTENDED RIGHT COLECTOMY, OMENTALPEXY, BILATERAL TAP BLOCKS; ASSESSMENT OF TISSUE USING FIREFLY FLUORESCENCE; ANAL FISTULA REPAIR, HEMORRHOID LIGATION AND PEXY    08/01/2019 Pathology Results   FINAL MICROSCOPIC DIAGNOSIS: 08/01/19 A. COLON, PROXIMAL RIGHT COLON TO SPLENIC FLEXURE, RESECTION:  -  Adenocarcinoma, moderately differentiated, 4.0 cm  -  No carcinoma identified in thirty-one lymph nodes (0/31)  -  Margins uninvolved by carcinoma  -  See oncology table and comment below   B. SKIN, RIGHT ANTERIOR PERIRECTAL TRACT, EXCISION:  -  Squamous mucosa with underlying chronic inflammation and granulation  tissue  -  No malignancy identified   C. SKIN, LEFT POSTERIOR HEMORRHOID, EXCISION:  -  Squamous mucosa with dilated submucosal vessels consistent with  hemorrhoid(s)  -  No malignancy identified    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([facility])  08/01/2019 Initial Diagnosis   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([facility])   08/01/2019 Cancer Staging   Staging form: Colon and Rectum, AJCC 8th Edition - Pathologic  stage from 08/01/2019: Stage IIA (pT3, pN0, cM0) - Signed by Malachy Mood, MD on 09/05/2019   05/07/2021 Imaging   EXAM: CT CHEST, ABDOMEN, AND PELVIS WITH CONTRAST  IMPRESSION: No findings to suggest locally recurrent or metastatic disease.   Status post right hemicolectomy.      CURRENT THERAPY: Surveillance   INTERVAL HISTORY Mr. Mccoin returns for follow up as scheduled, last seen by Dr. Mosetta Putt 05/13/22 with surveillance scan which was negative.   ROS   Past Medical History:  Diagnosis Date   Arthritis    Colon cancer (HCC)    COPD (chronic obstructive pulmonary disease) (HCC)    mild per pt.   Coronary artery disease    cardiac catheterization on January 26,2010, right coronary arter distal occlusion with stenting of this with xience drug-eluting stent.  Pt also had liminal irregularities  in the LAD and 40-50% mid circumflex  stenosis   Diabetes mellitus without complication (HCC)    Dyspnea    exersion only   GERD (gastroesophageal reflux disease)    History of colon polyps    History of kidney stones    Hyperlipidemia    Hypertension    Myocardial infarction [facility]) 2010   Perirectal abscess    Pneumonia 2013   Stomach ulcer 1970's     Past Surgical History:  Procedure Laterality Date   COLONOSCOPY     CORONARY ANGIOPLASTY WITH STENT PLACEMENT  2010   POLYPECTOMY       Outpatient Encounter Medications as of 11/09/2022  Medication Sig   aspirin EC 81 MG tablet Take 81 mg by mouth every evening.    b complex vitamins tablet Take 1 tablet by mouth every evening.    cyclobenzaprine (FLEXERIL) 10 MG tablet Take 10 mg by mouth at bedtime. (Patient not taking: Reported on 06/16/2021)   lisinopril (ZESTRIL) 20 MG tablet Take 20 mg by mouth daily.   meloxicam (MOBIC) 15 MG tablet Take 15 mg by mouth daily.   metFORMIN (GLUCOPHAGE) 500 MG tablet Take 500 mg by mouth every evening.    naproxen sodium (ALEVE) 220 MG tablet Take 220-440 mg by mouth 2 (two) times daily as needed  (pain.).   nitroGLYCERIN (NITROSTAT) 0.4 MG SL tablet Place 1 tablet (0.4 mg total) under the tongue every 5 (five) minutes as needed.   ONETOUCH ULTRA test strip 2 (two) times daily.   pantoprazole (PROTONIX) 40 MG tablet Take 40 mg by mouth every other day. In the evening.   simvastatin (ZOCOR) 40 MG tablet TAKE 1 TABLET (40 MG TOTAL) BY MOUTH AT BEDTIME. (Patient taking differently: Take 40 mg by mouth every evening.)   vitamin B-12 (CYANOCOBALAMIN) 1000 MCG tablet Take 1,000 mcg by mouth every evening.   Facility-Administered Encounter Medications as of 11/09/2022  Medication   0.9 %  sodium chloride infusion     There were no vitals filed for this visit. There is no height or weight on file to calculate BMI.   PHYSICAL EXAM GENERAL:alert, no distress and comfortable SKIN: no rash  EYES: sclera clear NECK: without mass LYMPH:  no palpable cervical or supraclavicular lymphadenopathy  LUNGS: clear with normal breathing effort HEART: regular rate & rhythm, no lower extremity edema ABDOMEN: abdomen soft, non-tender and normal bowel sounds NEURO: alert & oriented x 3 with fluent speech, no focal motor/sensory  deficits Breast exam:  PAC without erythema    CBC    Component Value Date/Time   WBC 7.7 05/09/2022 0847   WBC 13.4 (H) 08/02/2019 0502   RBC 4.94 05/09/2022 0847   HGB 16.9 05/09/2022 0847   HGB 16.3 01/02/2019 0855   HCT 47.1 05/09/2022 0847   HCT 47.8 01/02/2019 0855   PLT 119 (L) 05/09/2022 0847   PLT 134 (L) 01/02/2019 0855   MCV 95.3 05/09/2022 0847   MCV 99 (H) 01/02/2019 0855   MCH 34.2 (H) 05/09/2022 0847   MCHC 35.9 05/09/2022 0847   RDW 12.4 05/09/2022 0847   RDW 12.5 01/02/2019 0855   LYMPHSABS 1.8 05/09/2022 0847   MONOABS 0.5 05/09/2022 0847   EOSABS 0.1 05/09/2022 0847   BASOSABS 0.1 05/09/2022 0847     CMP     Component Value Date/Time   NA 139 05/09/2022 0847   NA 141 01/02/2019 0855   K 4.3 05/09/2022 0847   CL 104 05/09/2022 0847   CO2  29 05/09/2022 0847   GLUCOSE 128 (H) 05/09/2022 0847   BUN 12 05/09/2022 0847   BUN 14 01/02/2019 0855   CREATININE 1.01 05/09/2022 0847   CALCIUM 9.5 05/09/2022 0847   PROT 6.9 05/09/2022 0847   PROT 7.2 01/10/2020 1021   ALBUMIN 4.2 05/09/2022 0847   ALBUMIN 4.4 01/10/2020 1021   AST 17 05/09/2022 0847   ALT 21 05/09/2022 0847   ALKPHOS 58 05/09/2022 0847   BILITOT 0.8 05/09/2022 0847   GFRNONAA >60 05/09/2022 0847   GFRAA >60 01/06/2020 0946     ASSESSMENT & PLAN:Jonnathan Jule Schlabach is a 72 y.o. male with    1. Left side colon cancer, pT3N0M0, moderately differentiated, stage IIA -Diagnosed 05/2019, s/p surgical resection by Dr Michaell Cowing on 08/01/19.  -Guardant reveal ctDNA has been undetected, last 08/2022 -surveillance colonoscopy 06/30/21 by Dr. Myrtie Neither showed only polyps (path showed tubular adenoma); 3 year recall -Surveillance CT's NED, last in 05/2022; future scans as clinically indicated   2.  COPD, coronary artery disease, status post stent placement, diabetes, hypertension, arthritis -Follow-up with PCP and cardiology -stable   3. Health maintenance  -he plans to proceed with flu vaccine soon -continue healthy lifestyle, exercise, and age-appropriate f/up     PLAN:  No orders of the defined types were placed in this encounter.     All questions were answered. The patient knows to call the clinic with any problems, questions or concerns. No barriers to learning were detected. I spent *** counseling the patient face to face. The total time spent in the appointment was *** and more than 50% was on counseling, review of test results, and coordination of care.   Santiago Glad, NP-C @DATE @

## 2022-11-09 ENCOUNTER — Other Ambulatory Visit: Payer: Self-pay

## 2022-11-09 ENCOUNTER — Inpatient Hospital Stay (HOSPITAL_BASED_OUTPATIENT_CLINIC_OR_DEPARTMENT_OTHER): Payer: Medicare Other | Admitting: Nurse Practitioner

## 2022-11-09 ENCOUNTER — Encounter: Payer: Self-pay | Admitting: Nurse Practitioner

## 2022-11-09 ENCOUNTER — Inpatient Hospital Stay: Payer: Medicare Other | Attending: Hematology

## 2022-11-09 VITALS — BP 138/69 | HR 68 | Temp 98.5°F | Resp 16 | Wt 196.6 lb

## 2022-11-09 DIAGNOSIS — C186 Malignant neoplasm of descending colon: Secondary | ICD-10-CM

## 2022-11-09 DIAGNOSIS — F17210 Nicotine dependence, cigarettes, uncomplicated: Secondary | ICD-10-CM | POA: Insufficient documentation

## 2022-11-09 DIAGNOSIS — Z85038 Personal history of other malignant neoplasm of large intestine: Secondary | ICD-10-CM | POA: Diagnosis present

## 2022-11-09 DIAGNOSIS — C184 Malignant neoplasm of transverse colon: Secondary | ICD-10-CM

## 2022-11-09 LAB — CEA (ACCESS): CEA (CHCC): 3.83 ng/mL (ref 0.00–5.00)

## 2022-11-09 LAB — CBC WITH DIFFERENTIAL (CANCER CENTER ONLY)
Abs Immature Granulocytes: 0.02 10*3/uL (ref 0.00–0.07)
Basophils Absolute: 0.1 10*3/uL (ref 0.0–0.1)
Basophils Relative: 1 %
Eosinophils Absolute: 0.2 10*3/uL (ref 0.0–0.5)
Eosinophils Relative: 2 %
HCT: 46.5 % (ref 39.0–52.0)
Hemoglobin: 16.4 g/dL (ref 13.0–17.0)
Immature Granulocytes: 0 %
Lymphocytes Relative: 25 %
Lymphs Abs: 1.9 10*3/uL (ref 0.7–4.0)
MCH: 34 pg (ref 26.0–34.0)
MCHC: 35.3 g/dL (ref 30.0–36.0)
MCV: 96.5 fL (ref 80.0–100.0)
Monocytes Absolute: 0.5 10*3/uL (ref 0.1–1.0)
Monocytes Relative: 6 %
Neutro Abs: 5 10*3/uL (ref 1.7–7.7)
Neutrophils Relative %: 66 %
Platelet Count: 121 10*3/uL — ABNORMAL LOW (ref 150–400)
RBC: 4.82 MIL/uL (ref 4.22–5.81)
RDW: 12.7 % (ref 11.5–15.5)
WBC Count: 7.5 10*3/uL (ref 4.0–10.5)
nRBC: 0 % (ref 0.0–0.2)

## 2022-11-09 LAB — CMP (CANCER CENTER ONLY)
ALT: 23 U/L (ref 0–44)
AST: 17 U/L (ref 15–41)
Albumin: 4.5 g/dL (ref 3.5–5.0)
Alkaline Phosphatase: 58 U/L (ref 38–126)
Anion gap: 6 (ref 5–15)
BUN: 12 mg/dL (ref 8–23)
CO2: 29 mmol/L (ref 22–32)
Calcium: 9.3 mg/dL (ref 8.9–10.3)
Chloride: 105 mmol/L (ref 98–111)
Creatinine: 0.95 mg/dL (ref 0.61–1.24)
GFR, Estimated: >60 mL/min (ref >60)
Glucose, Bld: 131 mg/dL — ABNORMAL HIGH (ref 70–99)
Potassium: 4.3 mmol/L (ref 3.5–5.1)
Sodium: 140 mmol/L (ref 135–145)
Total Bilirubin: 0.8 mg/dL (ref 0.3–1.2)
Total Protein: 7.5 g/dL (ref 6.5–8.1)

## 2023-01-16 ENCOUNTER — Other Ambulatory Visit: Payer: Self-pay

## 2023-01-16 DIAGNOSIS — C186 Malignant neoplasm of descending colon: Secondary | ICD-10-CM

## 2023-01-29 NOTE — Progress Notes (Unsigned)
Cardiology Office Note:   Date:  02/01/2023  ID:  [name] [name], DOB [birth year], MRN [id number] PCP: [facility] Providers Cardiologist:  Rollene Rotunda, MD {  History of Present Illness:   [name] [name] is a 72 y.o. male  who presents for follow-up of CAD.  He had a negative POET (Plain Old Exercise Treadmill) in 2017.   He presents for follow up.   He had coronary disease in 2010.  He had a distal occlusion of stenting of his right coronary artery and LAD luminal irregularities with circumflex 40 to 50%.     Since I last saw him he has done well.  He denies any new cardiovascular symptoms.  He is golfing routinely.  He denies any cardiovascular symptoms such as chest pressure, neck or arm discomfort.  He had no new palpitations, presyncope or syncope.  He has no shortness of breath, PND or orthopnea.  He is unfortunately still smoking cigarettes.   ROS: As stated in the HPI and negative for all other systems.  Studies Reviewed:    EKG:   EKG Interpretation Date/Time:  Wednesday February 01 2023 08:44:09 EDT Ventricular Rate:  70 PR Interval:  198 QRS Duration:  92 QT Interval:  394 QTC Calculation: 425 R Axis:   25  Text Interpretation: Normal sinus rhythm No significant change since last tracing Confirmed by Rollene Rotunda ([id number]) on 02/01/2023 9:04:50 AM     Risk Assessment/Calculations:           Physical Exam:   VS:  BP 138/88   Pulse 70   Ht 5\' 4"  (1.626 m)   Wt 199 lb (90.3 kg)   SpO2 97%   BMI 34.16 kg/m    Wt Readings from Last 3 Encounters:  02/01/23 199 lb (90.3 kg)  11/09/22 196 lb 9.6 oz (89.2 kg)  05/13/22 201 lb 6.4 oz (91.4 kg)     GEN: Well nourished, well developed in no acute distress NECK: No JVD; No carotid bruits CARDIAC: RRR, no murmurs, rubs, gallops RESPIRATORY:  Clear to auscultation without rales, wheezing or rhonchi  ABDOMEN: Soft, non-tender, non-distended EXTREMITIES:  No  edema; No deformity   ASSESSMENT AND PLAN:   HYPERTENSION, UNSPECIFIED -  The blood pressure slightly elevated.  I like to increase his lisinopril to 30 mg daily.  He will keep an eye on his blood pressure.     CAD, UNSPECIFIED SITE -  The patient has no new sypmtoms.  No further cardiovascular testing is indicated.  We will continue with aggressive risk reduction and meds as listed.   TOBACCO ABUSE -  He again is unable to quit smoking we talked about this.  DYSLIPIDEMIA - LDL was at target at 37.  No change in therapy.  DM - A1C was 6.1.  No change in therapy.         Follow up with me in one year.   Signed, Rollene Rotunda, MD

## 2023-02-01 ENCOUNTER — Encounter: Payer: Self-pay | Admitting: Cardiology

## 2023-02-01 ENCOUNTER — Ambulatory Visit: Payer: Medicare Other | Attending: Cardiology | Admitting: Cardiology

## 2023-02-01 VITALS — BP 138/88 | HR 70 | Ht 64.0 in | Wt 199.0 lb

## 2023-02-01 DIAGNOSIS — E785 Hyperlipidemia, unspecified: Secondary | ICD-10-CM | POA: Diagnosis present

## 2023-02-01 DIAGNOSIS — I10 Essential (primary) hypertension: Secondary | ICD-10-CM | POA: Diagnosis present

## 2023-02-01 DIAGNOSIS — I2510 Atherosclerotic heart disease of native coronary artery without angina pectoris: Secondary | ICD-10-CM | POA: Insufficient documentation

## 2023-02-01 DIAGNOSIS — E118 Type 2 diabetes mellitus with unspecified complications: Secondary | ICD-10-CM | POA: Diagnosis present

## 2023-02-01 MED ORDER — LISINOPRIL 10 MG PO TABS: 10.0000 mg | ORAL_TABLET | Freq: Every day | ORAL | 3 refills | Status: DC

## 2023-02-01 NOTE — Patient Instructions (Signed)
Medication Instructions:  Take Lisinopril 10 mg daily Take Lisinopril 20 mg daily Continue all other medications *If you need a refill on your cardiac medications before your next appointment, please call your pharmacy*   Lab Work: None ordered   Testing/Procedures: None ordered   Follow-Up: At [facility], you and your health needs are our priority.  As part of our continuing mission to provide you with exceptional heart care, we have created designated Provider Care Teams.  These Care Teams include your primary Cardiologist (physician) and Advanced Practice Providers (APPs -  Physician Assistants and Nurse Practitioners) who all work together to provide you with the care you need, when you need it.  We recommend signing up for the patient portal called "MyChart".  Sign up information is provided on this After Visit Summary.  MyChart is used to connect with patients for Virtual Visits (Telemedicine).  Patients are able to view lab/test results, encounter notes, upcoming appointments, etc.  Non-urgent messages can be sent to your provider as well.   To learn more about what you can do with MyChart, go to [email or web address].    Your next appointment:  1 year     Call in June to schedule Oct appointment     Provider:  Dr.Hochrein

## 2023-02-06 ENCOUNTER — Inpatient Hospital Stay: Payer: Medicare Other | Attending: Hematology

## 2023-02-06 ENCOUNTER — Other Ambulatory Visit: Payer: Self-pay

## 2023-02-06 DIAGNOSIS — C186 Malignant neoplasm of descending colon: Secondary | ICD-10-CM

## 2023-02-08 ENCOUNTER — Other Ambulatory Visit: Payer: Self-pay

## 2023-02-14 ENCOUNTER — Telehealth: Payer: Self-pay

## 2023-02-14 ENCOUNTER — Encounter: Payer: Self-pay | Admitting: Hematology

## 2023-02-14 NOTE — Telephone Encounter (Signed)
LVM for pt to give Dr. Latanya Maudlin office a call to go over pt's recent Guardant Reveal results.  Awaiting pt's return call.

## 2023-02-15 ENCOUNTER — Other Ambulatory Visit: Payer: Self-pay

## 2023-02-15 LAB — GUARDANT REVEAL

## 2023-02-15 NOTE — Progress Notes (Signed)
Patient returned [place] call about his Temus results, I made him aware that it was negative. Patient voiced full understanding.

## 2023-05-11 ENCOUNTER — Other Ambulatory Visit: Payer: Self-pay | Admitting: Nurse Practitioner

## 2023-05-11 DIAGNOSIS — C184 Malignant neoplasm of transverse colon: Secondary | ICD-10-CM

## 2023-05-11 NOTE — Progress Notes (Unsigned)
 Patient Care Team: [facility] as PCP - General Lavona Agent, MD as PCP - Cardiology (Cardiology) Sheldon Standing, MD as Consulting Physician (General Surgery) Lavona Agent, MD as Consulting Physician (Cardiology) Legrand Victory LITTIE MOULD, MD as Consulting Physician (Gastroenterology) Lanny Callander, MD as Consulting Physician (Oncology)  Clinic Day:  05/11/2023  Referring physician: [facility]*  ASSESSMENT & PLAN:   Assessment & Plan: 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(HCC) pT3N0M0, moderately differentiated, stage IIA -Diagnosed 05/2019, s/p surgical resection by Dr Sheldon on 08/01/19.  -Guardant reveal ctDNA has been undetected, last 08/2022 -surveillance colonoscopy 06/30/21 by Dr. Legrand showed only polyps (path showed tubular adenoma); 3 year recall -Surveillance CT's NED, last in 05/2022; future scans as clinically indicated -Mr. Kaus is clinically doing well.  Exam is benign, labs at baseline; will follow-up the pending CEA from today.   -Overall no clinical concern for recurrence -Continue surveillance to complete 5 years, next visit in 6 then 12 months    The patient understands the plans discussed today and is in agreement with them.  He knows to contact our office if he develops concerns prior to his next appointment.  I provided *** minutes of face-to-face time during this encounter and > 50% was spent counseling as documented under my assessment and plan.    Powell FORBES Lessen, NP  [facility] - A DEPT OF [facility] [address] [place] [address] Dept: [phone] Dept Fax: [phone]   No orders of the defined types were placed in this encounter.     CHIEF COMPLAINT:  CC: Cancer of left colon  Current Treatment: Surveillance  INTERVAL HISTORY:  Rollen is here today for repeat clinical assessment.  He was last seen by Lacie, NP on 11/09/2022.  Was doing well at the  time.  He denies fevers or chills. He denies pain. His appetite is good. His weight {Weight change:10426}.  I have reviewed the past medical history, past surgical history, social history and family history with the patient and they are unchanged from previous note.  ALLERGIES:  is allergic to clopidogrel bisulfate.  MEDICATIONS:  Current Outpatient Medications  Medication Sig Dispense Refill   aspirin  EC 81 MG tablet Take 81 mg by mouth every evening.      b complex vitamins tablet Take 1 tablet by mouth every evening.      cyclobenzaprine (FLEXERIL) 10 MG tablet Take 10 mg by mouth at bedtime.     lisinopril  (ZESTRIL ) 10 MG tablet Take 1 tablet (10 mg total) by mouth daily. 90 tablet 3   lisinopril  (ZESTRIL ) 20 MG tablet Take 20 mg by mouth daily.     meloxicam (MOBIC) 15 MG tablet Take 15 mg by mouth daily. (Patient not taking: Reported on 11/09/2022)     metFORMIN (GLUCOPHAGE) 500 MG tablet Take 500 mg by mouth every evening.      naproxen sodium (ALEVE) 220 MG tablet Take 220-440 mg by mouth 2 (two) times daily as needed (pain.).     nitroGLYCERIN  (NITROSTAT ) 0.4 MG SL tablet Place 1 tablet (0.4 mg total) under the tongue every 5 (five) minutes as needed. 25 tablet 2   ONETOUCH ULTRA test strip 2 (two) times daily.     pantoprazole  (PROTONIX ) 40 MG tablet Take 40 mg by mouth every other day. In the evening.     simvastatin  (ZOCOR ) 40 MG tablet TAKE 1 TABLET (40 MG TOTAL)  BY MOUTH AT BEDTIME. (Patient taking differently: Take 40 mg by mouth every evening.) 30 tablet 6   Current Facility-Administered Medications  Medication Dose Route Frequency Provider Last Rate Last Admin   0.9 %  sodium chloride  infusion  500 mL Intravenous Once Danis, Victory LITTIE MOULD, MD        HISTORY OF PRESENT ILLNESS:   Oncology History Overview Note   Cancer Staging  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[facility]) Staging form: Colon and Rectum, AJCC 8th Edition - Pathologic stage from 08/01/2019: Stage IIA (pT3, pN0, cM0) -  Signed by Lanny Callander, MD on 09/05/2019 Total positive nodes: 0 Histologic grading system: 4 grade system Histologic grade (G): G2 Residual tumor (R): R0 - None     Cancer of left colon (HCC) (Resolved)  05/22/2019 Procedure   Colonoscopy by Dr Legrand 05/22/19  IMPRESSION - A fullness with overlying mild erythema right peri-anal area. Purulent material found on perianal exam. - The examined portion of the ileum was normal. - Three 3 to 5 mm polyps in the rectum and in the ascending colon, removed with a cold snare. Resected and retrieved. - Diverticulosis in the left colon. - Likely malignant partially obstructing tumor at the splenic flexure. Biopsied. Tattooed. - The examination was otherwise normal on direct and retroflexion views.   05/22/2019 Initial Biopsy   Diagnosis 05/22/19 1. Ascending Colon Polyp, rectal, polyps, 3 - TUBULAR ADENOMA(S) WITHOUT HIGH-GRADE DYSPLASIA OR MALIGNANCY - INFLAMMATORY POLYP(S) 2. Descending Colon Biopsy, colon mass - ADENOCARCINOMA. SEE NOTE   05/31/2019 Imaging   CT CAP W Contrast 05/31/19  IMPRESSION: 1. Apple-core lesion described on recent colonoscopy report is identified in the proximal to mid transverse colon. This lesion measures about 3.8 cm in length and is associated with small lymph nodes in the omentum just inferior to the lesion. 2. 1.6 cm ill-defined low-density lesion in the medial segment left liver, highly suspicious for metastatic disease. MRI abdomen with and without contrast would likely prove helpful to further evaluate. 3. Upper normal hepatoduodenal ligament lymph nodes. Attention on follow-up imaging recommended. 4. Tiny bilateral pulmonary nodules measuring up to 4 mm. Unlikely to be metastatic, but attention on follow-up recommended. 5. Tiny hiatal hernia. 6. Aortic Atherosclerosis (ICD10-I70.0).   06/03/2019 Initial Diagnosis   Cancer of left colon (HCC)   06/05/2019 Imaging   MRI abdomen  IMPRESSION: 1. Lesion of  concern in segment 4A of the liver has imaging characteristics most compatible with a small cavernous hemangioma. No definite signs of metastatic disease to the liver. 2. Probable iron deposition in the spleen. 3. Aortic atherosclerosis.     08/01/2019 Surgery   Surgery by Dr Sheldon 08/01/19  XI ROBOTIC EXTENDED RIGHT COLECTOMY, OMENTALPEXY, BILATERAL TAP BLOCKS; ASSESSMENT OF TISSUE USING FIREFLY FLUORESCENCE; ANAL FISTULA REPAIR, HEMORRHOID LIGATION AND PEXY    08/01/2019 Pathology Results   FINAL MICROSCOPIC DIAGNOSIS: 08/01/19 A. COLON, PROXIMAL RIGHT COLON TO SPLENIC FLEXURE, RESECTION:  -  Adenocarcinoma, moderately differentiated, 4.0 cm  -  No carcinoma identified in thirty-one lymph nodes (0/31)  -  Margins uninvolved by carcinoma  -  See oncology table and comment below   B. SKIN, RIGHT ANTERIOR PERIRECTAL TRACT, EXCISION:  -  Squamous mucosa with underlying chronic inflammation and granulation  tissue  -  No malignancy identified   C. SKIN, LEFT POSTERIOR HEMORRHOID, EXCISION:  -  Squamous mucosa with dilated submucosal vessels consistent with  hemorrhoid(s)  -  No malignancy identified    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(HCC)  08/01/2019 Initial Diagnosis   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(HCC)   08/01/2019 Cancer Staging   Staging form: Colon and Rectum, AJCC 8th Edition - Pathologic stage from 08/01/2019: Stage IIA (pT3, pN0, cM0) - Signed by Lanny Callander, MD on 09/05/2019   05/07/2021 Imaging   EXAM: CT CHEST, ABDOMEN, AND PELVIS WITH CONTRAST  IMPRESSION: No findings to suggest locally recurrent or metastatic disease.   Status post right hemicolectomy.       REVIEW OF SYSTEMS:   Constitutional: Denies fevers, chills or abnormal weight loss Eyes: Denies blurriness of vision Ears, nose, mouth, throat, and face: Denies mucositis or sore throat Respiratory: Denies cough, dyspnea or wheezes Cardiovascular: Denies palpitation, chest discomfort or lower extremity  swelling Gastrointestinal:  Denies nausea, heartburn or change in bowel habits Skin: Denies abnormal skin rashes Lymphatics: Denies new lymphadenopathy or easy bruising Neurological:Denies numbness, tingling or new weaknesses Behavioral/Psych: Mood is stable, no new changes  All other systems were reviewed with the patient and are negative.   VITALS:  There were no vitals taken for this visit.  Wt Readings from Last 3 Encounters:  02/01/23 199 lb (90.3 kg)  11/09/22 196 lb 9.6 oz (89.2 kg)  05/13/22 201 lb 6.4 oz (91.4 kg)    There is no height or weight on file to calculate BMI.  Performance status (ECOG): {CHL ONC [id number]  PHYSICAL EXAM:   GENERAL:alert, no distress and comfortable SKIN: skin color, texture, turgor are normal, no rashes or significant lesions EYES: normal, Conjunctiva are pink and non-injected, sclera clear OROPHARYNX:no exudate, no erythema and lips, buccal mucosa, and tongue normal  NECK: supple, thyroid normal size, non-tender, without nodularity LYMPH:  no palpable lymphadenopathy in the cervical, axillary or inguinal LUNGS: clear to auscultation and percussion with normal breathing effort HEART: regular rate & rhythm and no murmurs and no lower extremity edema ABDOMEN:abdomen soft, non-tender and normal bowel sounds Musculoskeletal:no cyanosis of digits and no clubbing  NEURO: alert & oriented x 3 with fluent speech, no focal motor/sensory deficits  LABORATORY DATA:  I have reviewed the data as listed    Component Value Date/Time   NA 140 11/09/2022 0842   NA 141 01/02/2019 0855   K 4.3 11/09/2022 0842   CL 105 11/09/2022 0842   CO2 29 11/09/2022 0842   GLUCOSE 131 (H) 11/09/2022 0842   BUN 12 11/09/2022 0842   BUN 14 01/02/2019 0855   CREATININE 0.95 11/09/2022 0842   CALCIUM 9.3 11/09/2022 0842   PROT 7.5 11/09/2022 0842   PROT 7.2 01/10/2020 1021   ALBUMIN 4.5 11/09/2022 0842   ALBUMIN 4.4 01/10/2020 1021   AST 17 11/09/2022 0842    ALT 23 11/09/2022 0842   ALKPHOS 58 11/09/2022 0842   BILITOT 0.8 11/09/2022 0842   GFRNONAA >60 11/09/2022 0842   GFRAA >60 01/06/2020 0946    No results found for: SPEP, UPEP  Lab Results  Component Value Date   WBC 7.5 11/09/2022   NEUTROABS 5.0 11/09/2022   HGB 16.4 11/09/2022   HCT 46.5 11/09/2022   MCV 96.5 11/09/2022   PLT 121 (L) 11/09/2022      Chemistry      Component Value Date/Time   NA 140 11/09/2022 0842   NA 141 01/02/2019 0855   K 4.3 11/09/2022 0842   CL 105 11/09/2022 0842   CO2 29 11/09/2022 0842   BUN 12 11/09/2022 0842   BUN 14 01/02/2019 0855   CREATININE 0.95 11/09/2022 0842  Component Value Date/Time   CALCIUM 9.3 11/09/2022 0842   ALKPHOS 58 11/09/2022 0842   AST 17 11/09/2022 0842   ALT 23 11/09/2022 0842   BILITOT 0.8 11/09/2022 0842       RADIOGRAPHIC STUDIES: I have personally reviewed the radiological images as listed and agreed with the findings in the report. No results found.

## 2023-05-11 NOTE — Assessment & Plan Note (Signed)
 pT3N0M0, moderately differentiated, stage IIA -Diagnosed 05/2019, s/p surgical resection by Dr Sheldon on 08/01/19.  -Guardant reveal ctDNA has been undetected, last 08/2022 -surveillance colonoscopy 06/30/21 by Dr. Legrand showed only polyps (path showed tubular adenoma); 3 year recall -Surveillance CT's NED, last in 05/2022; future scans as clinically indicated -[name] [name] is clinically doing well.  Exam is benign, labs at baseline; will follow-up the pending CEA from today.   -Overall no clinical concern for recurrence -Continue surveillance to complete 5 years, next visit in 6 then 12 months

## 2023-05-12 ENCOUNTER — Ambulatory Visit: Payer: Medicare Other | Admitting: Nurse Practitioner

## 2023-05-12 ENCOUNTER — Other Ambulatory Visit: Payer: Medicare Other

## 2023-05-12 DIAGNOSIS — C184 Malignant neoplasm of transverse colon: Secondary | ICD-10-CM

## 2023-05-28 NOTE — Progress Notes (Unsigned)
 Patient Care Team: [facility] as PCP - General Rollene Rotunda, MD as PCP - Cardiology (Cardiology) Karie Soda, MD as Consulting Physician (General Surgery) Rollene Rotunda, MD as Consulting Physician (Cardiology) Myrtie Neither Andreas Blower, MD as Consulting Physician (Gastroenterology) Malachy Mood, MD as Consulting Physician (Oncology)  Clinic Day:  05/29/2023  Referring physician: [facility]*  ASSESSMENT & PLAN:   Assessment & Plan: 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(HCC) pT3N0M0, moderately differentiated, stage IIA -Diagnosed 05/2019, s/p surgical resection by Dr Michaell Cowing on 08/01/19.  -Guardant reveal ctDNA has been undetected, last 08/2022 -surveillance colonoscopy 06/30/21 by Dr. Myrtie Neither showed only polyps (path showed tubular adenoma); 3 year recall (06/2024) -Surveillance CT's NED, last in 05/2022; future scans as clinically indicated -Mr. Kollman is clinically doing well.  Exam is benign, labs at baseline; will follow-up the pending CEA from today.   -Overall no clinical concern for recurrence -Continue surveillance to complete 5 years, next visit in 6 then 12 months  Plan:  Reviewed labs. Stable, mild thrombocytopenia. Mildly elevated blood sugar.  Benign clinical exam with no concerns for recurrence.  Recommend final visit 12 months and sooner if needed.  Next colonoscopy in 09/2024.     The patient understands the plans discussed today and is in agreement with them.  He knows to contact our office if he develops concerns prior to his next appointment.  I provided 20 minutes of face-to-face time during this encounter and > 50% was spent counseling as documented under my assessment and plan.    Carlean Jews, NP  [facility] [place] [facility] - A DEPT OF [facility] [address] [place] [address] Dept: [phone] Dept Fax: [phone]   No orders of the defined types were placed in this  encounter.     CHIEF COMPLAINT:  CC: Cancer of left colon  Current Treatment: Surveillance  INTERVAL HISTORY:  Eliyohu is here today for repeat clinical assessment.  He was last seen by Clayborn Heron, NP on 11/09/2022.  Did have repeat colonoscopy 06/2021 per Dr. Myrtie Neither at [facility]. He had patent side-to-side ileo-colonic anastomosis characterized by healthy appearing mucosa. Single diminutive polyp at anastomosis was removed during procedure. There was 4mm polyp in the distal transverse colon, and diverticulosis in the left colon. Repeat is expected in 3 years. He continues to do well. States that he does have some intermittent diarrhea since surgery. This is manageable. He denies chest pain, chest pressure, or shortness of breath. He denies headaches or visual disturbances. He denies abdominal pain, nausea, vomiting, or changes in bowel or bladder habits.   He denies fevers or chills. He denies pain. His appetite is good. His weight has increased 5 pounds over last 4 months  .  I have reviewed the past medical history, past surgical history, social history and family history with the patient and they are unchanged from previous note.  ALLERGIES:  is allergic to clopidogrel bisulfate.  MEDICATIONS:  Current Outpatient Medications  Medication Sig Dispense Refill   aspirin EC 81 MG tablet Take 81 mg by mouth every evening.      b complex vitamins tablet Take 1 tablet by mouth every evening.      cyclobenzaprine (FLEXERIL) 10 MG tablet Take 10 mg by mouth at bedtime.     lisinopril (ZESTRIL) 20 MG tablet Take 20 mg by mouth daily.     meloxicam (MOBIC) 15 MG tablet Take 15  mg by mouth daily.     metFORMIN (GLUCOPHAGE) 500 MG tablet Take 500 mg by mouth every evening.      naproxen sodium (ALEVE) 220 MG tablet Take 220-440 mg by mouth 2 (two) times daily as needed (pain.).     nitroGLYCERIN (NITROSTAT) 0.4 MG SL tablet Place 1 tablet (0.4 mg total) under the tongue every 5 (five) minutes as  needed. 25 tablet 2   ONETOUCH ULTRA test strip 2 (two) times daily.     pantoprazole (PROTONIX) 40 MG tablet Take 40 mg by mouth every other day. In the evening.     simvastatin (ZOCOR) 40 MG tablet TAKE 1 TABLET (40 MG TOTAL) BY MOUTH AT BEDTIME. (Patient taking differently: Take 40 mg by mouth every evening.) 30 tablet 6   lisinopril (ZESTRIL) 10 MG tablet Take 1 tablet (10 mg total) by mouth daily. 90 tablet 3   Current Facility-Administered Medications  Medication Dose Route Frequency Provider Last Rate Last Admin   0.9 %  sodium chloride infusion  500 mL Intravenous Once Danis, Andreas Blower, MD        HISTORY OF PRESENT ILLNESS:   Oncology History Overview Note   Cancer Staging  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[facility]) Staging form: Colon and Rectum, AJCC 8th Edition - Pathologic stage from 08/01/2019: Stage IIA (pT3, pN0, cM0) - Signed by Malachy Mood, MD on 09/05/2019 Total positive nodes: 0 Histologic grading system: 4 grade system Histologic grade (G): G2 Residual tumor (R): R0 - None     Cancer of left colon (HCC) (Resolved)  05/22/2019 Procedure   Colonoscopy by Dr Myrtie Neither 05/22/19  IMPRESSION - A fullness with overlying mild erythema right peri-anal area. Purulent material found on perianal exam. - The examined portion of the ileum was normal. - Three 3 to 5 mm polyps in the rectum and in the ascending colon, removed with a cold snare. Resected and retrieved. - Diverticulosis in the left colon. - Likely malignant partially obstructing tumor at the splenic flexure. Biopsied. Tattooed. - The examination was otherwise normal on direct and retroflexion views.   05/22/2019 Initial Biopsy   Diagnosis 05/22/19 1. Ascending Colon Polyp, rectal, polyps, 3 - TUBULAR ADENOMA(S) WITHOUT HIGH-GRADE DYSPLASIA OR MALIGNANCY - INFLAMMATORY POLYP(S) 2. Descending Colon Biopsy, colon mass - ADENOCARCINOMA. SEE NOTE   05/31/2019 Imaging   CT CAP W Contrast 05/31/19  IMPRESSION: 1. Apple-core  lesion described on recent colonoscopy report is identified in the proximal to mid transverse colon. This lesion measures about 3.8 cm in length and is associated with small lymph nodes in the omentum just inferior to the lesion. 2. 1.6 cm ill-defined low-density lesion in the medial segment left liver, highly suspicious for metastatic disease. MRI abdomen with and without contrast would likely prove helpful to further evaluate. 3. Upper normal hepatoduodenal ligament lymph nodes. Attention on follow-up imaging recommended. 4. Tiny bilateral pulmonary nodules measuring up to 4 mm. Unlikely to be metastatic, but attention on follow-up recommended. 5. Tiny hiatal hernia. 6. Aortic Atherosclerosis (ICD10-I70.0).   06/03/2019 Initial Diagnosis   Cancer of left colon (HCC)   06/05/2019 Imaging   MRI abdomen  IMPRESSION: 1. 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 No definite signs of metastatic disease to the liver. 2. Probable iron deposition in the spleen. 3. Aortic atherosclerosis.     08/01/2019 Surgery   Surgery by Dr Michaell Cowing 08/01/19  XI ROBOTIC EXTENDED RIGHT COLECTOMY, OMENTALPEXY, BILATERAL  TAP BLOCKS; ASSESSMENT OF TISSUE USING FIREFLY FLUORESCENCE; ANAL FISTULA REPAIR, HEMORRHOID LIGATION AND PEXY    08/01/2019 Pathology Results   FINAL MICROSCOPIC DIAGNOSIS: 08/01/19 A. COLON, PROXIMAL RIGHT COLON TO SPLENIC FLEXURE, RESECTION:  -  Adenocarcinoma, moderately differentiated, 4.0 cm  -  No carcinoma identified in thirty-one lymph nodes (0/31)  -  Margins uninvolved by carcinoma  -  See oncology table and comment below   B. SKIN, RIGHT ANTERIOR PERIRECTAL TRACT, EXCISION:  -  Squamous mucosa with underlying chronic inflammation and granulation  tissue  -  No malignancy identified   C. SKIN, LEFT POSTERIOR HEMORRHOID, EXCISION:  -  Squamous mucosa with dilated submucosal vessels consistent with   hemorrhoid(s)  -  No malignancy identified    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(HCC)  08/01/2019 Initial Diagnosis   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(HCC)   08/01/2019 Cancer Staging   Staging form: Colon and Rectum, AJCC 8th Edition - Pathologic stage from 08/01/2019: Stage IIA (pT3, pN0, cM0) - Signed by Malachy Mood, MD on 09/05/2019   05/07/2021 Imaging   EXAM: CT CHEST, ABDOMEN, AND PELVIS WITH CONTRAST  IMPRESSION: No findings to suggest locally recurrent or metastatic disease.   Status post right hemicolectomy.   06/30/2021 Procedure   Colonoscopy done per Dr. Myrtie Neither Impression: Colonoscopy 06/2021 per Dr[facility] Myrtie Neither at [facility]. He had patent side-to-side ileo-colonic anastomosis characterized by healthy appearing mucosa. Single diminutive polyp at anastomosis was removed during procedure. There was 4mm polyp in the distal transverse colon, and diverticulosis in the left colon. Repeat is expected in 3 years.       REVIEW OF SYSTEMS:   Constitutional: Denies fevers, chills or abnormal weight loss Eyes: Denies blurriness of vision Ears, nose, mouth, throat, and face: Denies mucositis or sore throat Respiratory: Denies cough, dyspnea or wheezes Cardiovascular: Denies palpitation, chest discomfort or lower extremity swelling Gastrointestinal:  Denies nausea, heartburn or change in bowel habits. Has occasional diarrhea which is manageable.  Skin: Denies abnormal skin rashes Lymphatics: Denies new lymphadenopathy or easy bruising Neurological:Denies numbness, tingling or new weaknesses Behavioral/Psych: Mood is stable, no new changes  All other systems were reviewed with the patient and are negative.   VITALS:   Today's Vitals   05/29/23 0840  BP: 130/74  Pulse: 70  Resp: 20  Temp: 97.7 F (36.5 C)  TempSrc: Temporal  SpO2: 98%  Weight: 204 lb 6.4 oz (92.7 kg)   Body mass index is 35.09 kg/m.   Wt Readings from Last 3 Encounters:  05/29/23 204 lb 6.4 oz (92.7 kg)   02/01/23 199 lb (90.3 kg)  11/09/22 196 lb 9.6 oz (89.2 kg)    Body mass index is 35.09 kg/m.  Performance status (ECOG): 1 - Symptomatic but completely ambulatory  PHYSICAL EXAM:   GENERAL:alert, no distress and comfortable SKIN: skin color, texture, turgor are normal, no rashes or significant lesions EYES: normal, Conjunctiva are pink and non-injected, sclera clear OROPHARYNX:no exudate, no erythema and lips, buccal mucosa, and tongue normal  NECK: supple, thyroid normal size, non-tender, without nodularity LYMPH:  no palpable lymphadenopathy in the cervical, axillary or inguinal LUNGS: clear to auscultation and percussion with normal breathing effort HEART: regular rate & rhythm and no murmurs and no lower extremity edema ABDOMEN:abdomen soft, non-tender and normal bowel sounds Musculoskeletal:no cyanosis of digits and no clubbing  NEURO: alert & oriented x 3 with fluent speech, no focal motor/sensory deficits  LABORATORY DATA:  I have reviewed the data as listed  Component Value Date/Time   NA 142 05/29/2023 0759   NA 141 01/02/2019 0855   K 4.0 05/29/2023 0759   CL 109 05/29/2023 0759   CO2 27 05/29/2023 0759   GLUCOSE 141 (H) 05/29/2023 0759   BUN 10 05/29/2023 0759   BUN 14 01/02/2019 0855   CREATININE 0.97 05/29/2023 0759   CALCIUM 8.9 05/29/2023 0759   PROT 6.9 05/29/2023 0759   PROT 7.2 01/10/2020 1021   ALBUMIN 4.1 05/29/2023 0759   ALBUMIN 4.4 01/10/2020 1021   AST 19 05/29/2023 0759   ALT 25 05/29/2023 0759   ALKPHOS 50 05/29/2023 0759   BILITOT 0.6 05/29/2023 0759   GFRNONAA >60 05/29/2023 0759   GFRAA >60 01/06/2020 0946     Lab Results  Component Value Date   WBC 6.8 05/29/2023   NEUTROABS 4.1 05/29/2023   HGB 15.8 05/29/2023   HCT 44.9 05/29/2023   MCV 94.1 05/29/2023   PLT 108 (L) 05/29/2023

## 2023-05-28 NOTE — Assessment & Plan Note (Signed)
 pT3N0M0, moderately differentiated, stage IIA -Diagnosed 05/2019, s/p surgical resection by Dr Michaell Cowing on 08/01/19.  -Guardant reveal ctDNA has been undetected, last 08/2022 -surveillance colonoscopy 06/30/21 by Dr. Myrtie Neither showed only polyps (path showed tubular adenoma); 3 year recall -Surveillance CT's NED, last in 05/2022; future scans as clinically indicated -[name] [name] is clinically doing well.  Exam is benign, labs at baseline; will follow-up the pending CEA from today.   -Overall no clinical concern for recurrence -Continue surveillance to complete 5 years, next visit in 6 then 12 months

## 2023-05-29 ENCOUNTER — Inpatient Hospital Stay: Payer: Medicare Other | Attending: Nurse Practitioner

## 2023-05-29 ENCOUNTER — Inpatient Hospital Stay (HOSPITAL_BASED_OUTPATIENT_CLINIC_OR_DEPARTMENT_OTHER): Payer: Medicare Other | Admitting: Nurse Practitioner

## 2023-05-29 ENCOUNTER — Encounter: Payer: Self-pay | Admitting: Nurse Practitioner

## 2023-05-29 VITALS — BP 130/74 | HR 70 | Temp 97.7°F | Resp 20 | Wt 204.4 lb

## 2023-05-29 DIAGNOSIS — R739 Hyperglycemia, unspecified: Secondary | ICD-10-CM | POA: Insufficient documentation

## 2023-05-29 DIAGNOSIS — D696 Thrombocytopenia, unspecified: Secondary | ICD-10-CM | POA: Diagnosis not present

## 2023-05-29 DIAGNOSIS — Z85038 Personal history of other malignant neoplasm of large intestine: Secondary | ICD-10-CM | POA: Insufficient documentation

## 2023-05-29 DIAGNOSIS — C184 Malignant neoplasm of transverse colon: Secondary | ICD-10-CM

## 2023-05-29 LAB — CBC WITH DIFFERENTIAL (CANCER CENTER ONLY)
Abs Immature Granulocytes: 0.01 10*3/uL (ref 0.00–0.07)
Basophils Absolute: 0.1 10*3/uL (ref 0.0–0.1)
Basophils Relative: 1 %
Eosinophils Absolute: 0.1 10*3/uL (ref 0.0–0.5)
Eosinophils Relative: 2 %
HCT: 44.9 % (ref 39.0–52.0)
Hemoglobin: 15.8 g/dL (ref 13.0–17.0)
Immature Granulocytes: 0 %
Lymphocytes Relative: 29 %
Lymphs Abs: 2 10*3/uL (ref 0.7–4.0)
MCH: 33.1 pg (ref 26.0–34.0)
MCHC: 35.2 g/dL (ref 30.0–36.0)
MCV: 94.1 fL (ref 80.0–100.0)
Monocytes Absolute: 0.5 10*3/uL (ref 0.1–1.0)
Monocytes Relative: 7 %
Neutro Abs: 4.1 10*3/uL (ref 1.7–7.7)
Neutrophils Relative %: 61 %
Platelet Count: 108 10*3/uL — ABNORMAL LOW (ref 150–400)
RBC: 4.77 MIL/uL (ref 4.22–5.81)
RDW: 12.7 % (ref 11.5–15.5)
WBC Count: 6.8 10*3/uL (ref 4.0–10.5)
nRBC: 0 % (ref 0.0–0.2)

## 2023-05-29 LAB — CMP (CANCER CENTER ONLY)
ALT: 25 U/L (ref 0–44)
AST: 19 U/L (ref 15–41)
Albumin: 4.1 g/dL (ref 3.5–5.0)
Alkaline Phosphatase: 50 U/L (ref 38–126)
Anion gap: 6 (ref 5–15)
BUN: 10 mg/dL (ref 8–23)
CO2: 27 mmol/L (ref 22–32)
Calcium: 8.9 mg/dL (ref 8.9–10.3)
Chloride: 109 mmol/L (ref 98–111)
Creatinine: 0.97 mg/dL (ref 0.61–1.24)
GFR, Estimated: >60 mL/min (ref >60)
Glucose, Bld: 141 mg/dL — ABNORMAL HIGH (ref 70–99)
Potassium: 4 mmol/L (ref 3.5–5.1)
Sodium: 142 mmol/L (ref 135–145)
Total Bilirubin: 0.6 mg/dL (ref 0.0–1.2)
Total Protein: 6.9 g/dL (ref 6.5–8.1)

## 2023-05-29 LAB — CEA (ACCESS): CEA (CHCC): 3.01 ng/mL (ref 0.00–5.00)

## 2023-07-13 ENCOUNTER — Other Ambulatory Visit: Payer: Self-pay

## 2023-08-10 ENCOUNTER — Other Ambulatory Visit: Payer: Self-pay

## 2023-08-15 ENCOUNTER — Other Ambulatory Visit: Payer: Self-pay

## 2023-08-15 DIAGNOSIS — C186 Malignant neoplasm of descending colon: Secondary | ICD-10-CM

## 2023-08-15 DIAGNOSIS — C184 Malignant neoplasm of transverse colon: Secondary | ICD-10-CM

## 2023-08-16 ENCOUNTER — Inpatient Hospital Stay: Attending: Nurse Practitioner

## 2023-08-16 DIAGNOSIS — C184 Malignant neoplasm of transverse colon: Secondary | ICD-10-CM

## 2023-08-16 DIAGNOSIS — C186 Malignant neoplasm of descending colon: Secondary | ICD-10-CM

## 2023-08-17 ENCOUNTER — Telehealth: Payer: Self-pay

## 2023-08-17 ENCOUNTER — Other Ambulatory Visit: Payer: Self-pay

## 2023-08-17 NOTE — Telephone Encounter (Signed)
 Patient is due for Lab Only appointment to have Guardant Reveal Drawn. Attempted to contact the patient to get the appointment scheduled.  Unable to reach the patient. Left detailed message on patient's voicemail.

## 2023-08-18 ENCOUNTER — Telehealth: Payer: Self-pay | Admitting: Nurse Practitioner

## 2023-08-18 ENCOUNTER — Other Ambulatory Visit: Payer: Self-pay

## 2023-08-23 ENCOUNTER — Encounter: Payer: Self-pay | Admitting: Hematology

## 2023-08-24 ENCOUNTER — Other Ambulatory Visit: Payer: Self-pay

## 2023-08-24 LAB — GUARDANT REVEAL

## 2023-08-24 NOTE — Progress Notes (Signed)
 As per Dr. Maryalice Smaller contacted patient to make him aware of his Guardant Reveal results. The results were negative, patient had no questions or concerns at this time.

## 2024-01-08 ENCOUNTER — Other Ambulatory Visit: Payer: Self-pay | Admitting: Cardiology

## 2024-01-24 ENCOUNTER — Telehealth: Payer: Self-pay

## 2024-01-24 ENCOUNTER — Other Ambulatory Visit: Payer: Self-pay

## 2024-01-24 DIAGNOSIS — C184 Malignant neoplasm of transverse colon: Secondary | ICD-10-CM

## 2024-01-24 NOTE — Telephone Encounter (Signed)
 Called patient to make lab appointment to have Guardant reveal drawn on 10/27 at 9am. Patient voiced full understanding and order was placed in epic as per Dr. Lanny.

## 2024-01-29 ENCOUNTER — Inpatient Hospital Stay: Attending: Nurse Practitioner

## 2024-01-29 DIAGNOSIS — C184 Malignant neoplasm of transverse colon: Secondary | ICD-10-CM

## 2024-01-29 DIAGNOSIS — D696 Thrombocytopenia, unspecified: Secondary | ICD-10-CM | POA: Insufficient documentation

## 2024-01-29 DIAGNOSIS — Z85038 Personal history of other malignant neoplasm of large intestine: Secondary | ICD-10-CM | POA: Insufficient documentation

## 2024-01-29 DIAGNOSIS — R739 Hyperglycemia, unspecified: Secondary | ICD-10-CM | POA: Insufficient documentation

## 2024-01-29 DIAGNOSIS — C186 Malignant neoplasm of descending colon: Secondary | ICD-10-CM

## 2024-01-29 LAB — CBC WITH DIFFERENTIAL (CANCER CENTER ONLY)
Abs Immature Granulocytes: 0.02 K/uL (ref 0.00–0.07)
Basophils Absolute: 0.1 K/uL (ref 0.0–0.1)
Basophils Relative: 1 %
Eosinophils Absolute: 0.2 K/uL (ref 0.0–0.5)
Eosinophils Relative: 2 %
HCT: 45.1 % (ref 39.0–52.0)
Hemoglobin: 15.9 g/dL (ref 13.0–17.0)
Immature Granulocytes: 0 %
Lymphocytes Relative: 24 %
Lymphs Abs: 1.6 K/uL (ref 0.7–4.0)
MCH: 33.9 pg (ref 26.0–34.0)
MCHC: 35.3 g/dL (ref 30.0–36.0)
MCV: 96.2 fL (ref 80.0–100.0)
Monocytes Absolute: 0.5 K/uL (ref 0.1–1.0)
Monocytes Relative: 7 %
Neutro Abs: 4.3 K/uL (ref 1.7–7.7)
Neutrophils Relative %: 66 %
Platelet Count: 114 K/uL — ABNORMAL LOW (ref 150–400)
RBC: 4.69 MIL/uL (ref 4.22–5.81)
RDW: 12.7 % (ref 11.5–15.5)
WBC Count: 6.6 K/uL (ref 4.0–10.5)
nRBC: 0 % (ref 0.0–0.2)

## 2024-01-29 LAB — CMP (CANCER CENTER ONLY)
ALT: 24 U/L (ref 0–44)
AST: 22 U/L (ref 15–41)
Albumin: 4.4 g/dL (ref 3.5–5.0)
Alkaline Phosphatase: 47 U/L (ref 38–126)
Anion gap: 4 — ABNORMAL LOW (ref 5–15)
BUN: 11 mg/dL (ref 8–23)
CO2: 30 mmol/L (ref 22–32)
Calcium: 9.4 mg/dL (ref 8.9–10.3)
Chloride: 107 mmol/L (ref 98–111)
Creatinine: 1.07 mg/dL (ref 0.61–1.24)
GFR, Estimated: >60 mL/min (ref >60)
Glucose, Bld: 153 mg/dL — ABNORMAL HIGH (ref 70–99)
Potassium: 4.6 mmol/L (ref 3.5–5.1)
Sodium: 141 mmol/L (ref 135–145)
Total Bilirubin: 0.6 mg/dL (ref 0.0–1.2)
Total Protein: 7.3 g/dL (ref 6.5–8.1)

## 2024-01-29 LAB — CEA (ACCESS): CEA (CHCC): 4 ng/mL (ref 0.00–5.00)

## 2024-02-06 ENCOUNTER — Encounter: Payer: Self-pay | Admitting: Hematology

## 2024-02-07 ENCOUNTER — Other Ambulatory Visit: Payer: Self-pay

## 2024-02-07 NOTE — Progress Notes (Signed)
 As per Dr. Lanny, called patient to relay his results to his Guardant Reveal. Spoke with patients wife to let her know the results came back negative. Wife voiced full understanding and had no further questions at this time.

## 2024-02-07 NOTE — Progress Notes (Unsigned)
  Cardiology Office Note:   Date:  02/09/2024  ID:  [name] [name], DOB [birth year], MRN [id number] PCP: [facility] Providers Cardiologist:  Lynwood Schilling, MD {  History of Present Illness:   [name] [name] is a 73 y.o. male  who presents for follow-up of CAD.  He had a negative POET (Plain Old Exercise Treadmill) in 2017.   He presents for follow up.   He had coronary disease in 2010.  He had a distal occlusion of stenting of his right coronary artery and LAD luminal irregularities with circumflex 40 to 50%.     Since I last saw him he scored his first: 1.  He does yard work.  He does his golfing.  The patient denies any new symptoms such as chest discomfort, neck or arm discomfort. There has been no new shortness of breath, PND or orthopnea. There have been no reported palpitations, presyncope or syncope.  He unfortunately still smokes cigarettes and drinks [employer].   ROS: As stated in the HPI and negative for all other systems.  Studies Reviewed:    EKG:   EKG Interpretation Date/Time:  Friday February 09 2024 08:58:10 EST Ventricular Rate:  57 PR Interval:  184 QRS Duration:  94 QT Interval:  410 QTC Calculation: 399 R Axis:   24  Text Interpretation: Sinus bradycardia with Sinus arrhythmia When compared with ECG of 01-Feb-2023 08:44, No significant change since last tracing Confirmed by Schilling Lynwood ([id number]) on 02/09/2024 9:17:52 AM    Risk Assessment/Calculations:       Physical Exam:   VS:  BP (!) 164/75   Pulse (!) 57   Ht 5' 4 (1.626 m)   Wt 205 lb 14.4 oz (93.4 kg)   SpO2 96%   BMI 35.34 kg/m    Wt Readings from Last 3 Encounters:  02/09/24 205 lb 14.4 oz (93.4 kg)  05/29/23 204 lb 6.4 oz (92.7 kg)  02/01/23 199 lb (90.3 kg)     GEN: Well nourished, well developed in no acute distress NECK: No JVD; No carotid bruits CARDIAC: RRR, no murmurs, rubs, gallops RESPIRATORY:  Clear to auscultation  without rales, wheezing or rhonchi  ABDOMEN: Soft, non-tender, non-distended EXTREMITIES:  No edema; No deformity   ASSESSMENT AND PLAN:   HYPERTENSION, UNSPECIFIED -  The blood pressure is elevated but he got lost getting here.  Usually at home it is in the 120s to 130s over 60s 70s.  No change in therapy.   CAD, UNSPECIFIED SITE -  I have continued to emphasize aggressive risk reduction.   TOBACCO ABUSE -  We again talked about the need to stop smoking.   DYSLIPIDEMIA - LDL was 51.  No change in therapy.   DM - A1C was 6.5.  No change in therapy.  Follow up with me in 1 year.  Signed, Lynwood Schilling, MD

## 2024-02-08 LAB — GUARDANT REVEAL

## 2024-02-09 ENCOUNTER — Encounter: Payer: Self-pay | Admitting: Cardiology

## 2024-02-09 ENCOUNTER — Ambulatory Visit: Attending: Cardiology | Admitting: Cardiology

## 2024-02-09 VITALS — BP 164/75 | HR 57 | Ht 64.0 in | Wt 205.9 lb

## 2024-02-09 DIAGNOSIS — E118 Type 2 diabetes mellitus with unspecified complications: Secondary | ICD-10-CM | POA: Insufficient documentation

## 2024-02-09 DIAGNOSIS — I10 Essential (primary) hypertension: Secondary | ICD-10-CM | POA: Insufficient documentation

## 2024-02-09 DIAGNOSIS — I2510 Atherosclerotic heart disease of native coronary artery without angina pectoris: Secondary | ICD-10-CM | POA: Diagnosis not present

## 2024-02-09 DIAGNOSIS — Z720 Tobacco use: Secondary | ICD-10-CM | POA: Diagnosis present

## 2024-02-09 DIAGNOSIS — E785 Hyperlipidemia, unspecified: Secondary | ICD-10-CM | POA: Insufficient documentation

## 2024-02-09 MED ORDER — NITROGLYCERIN 0.4 MG SL SUBL: 0.4000 mg | SUBLINGUAL_TABLET | SUBLINGUAL | 10 refills | Status: AC | PRN

## 2024-02-09 NOTE — Patient Instructions (Signed)
 Medication Instructions:  Refilled Nitorglycerin *If you need a refill on your cardiac medications before your next appointment, please call your pharmacy*  Lab Work: NONE If you have labs (blood work) drawn today and your tests are completely normal, you will receive your results only by: MyChart Message (if you have MyChart) OR A paper copy in the mail If you have any lab test that is abnormal or we need to change your treatment, we will call you to review the results.  Testing/Procedures: NONE  Follow-Up: At [facility], you and your health needs are our priority.  As part of our continuing mission to provide you with exceptional heart care, our providers are all part of one team.  This team includes your primary Cardiologist (physician) and Advanced Practice Providers or APPs (Physician Assistants and Nurse Practitioners) who all work together to provide you with the care you need, when you need it.  Your next appointment:   1 year(s)  Provider:   Lynwood Schilling, MD    We recommend signing up for the patient portal called MyChart.  Sign up information is provided on this After Visit Summary.  MyChart is used to connect with patients for Virtual Visits (Telemedicine).  Patients are able to view lab/test results, encounter notes, upcoming appointments, etc.  Non-urgent messages can be sent to your provider as well.   To learn more about what you can do with MyChart, go to [email or web address].

## 2024-04-05 ENCOUNTER — Other Ambulatory Visit: Payer: Self-pay | Admitting: Cardiology

## 2024-05-27 ENCOUNTER — Inpatient Hospital Stay: Payer: Medicare Other | Admitting: Nurse Practitioner

## 2024-05-27 ENCOUNTER — Inpatient Hospital Stay: Payer: Medicare Other
